# Patient Record
Sex: Female | Born: 1975 | Race: White | Hispanic: No | Marital: Married | Smoking: Former smoker
Health system: Southern US, Community
[De-identification: ages and names within clinical notes are randomized; demographics above are authoritative.]

## PROBLEM LIST (undated history)

## (undated) DIAGNOSIS — F4310 Post-traumatic stress disorder, unspecified: Secondary | ICD-10-CM

## (undated) DIAGNOSIS — M1990 Unspecified osteoarthritis, unspecified site: Secondary | ICD-10-CM

## (undated) DIAGNOSIS — F329 Major depressive disorder, single episode, unspecified: Secondary | ICD-10-CM

## (undated) DIAGNOSIS — Z87442 Personal history of urinary calculi: Secondary | ICD-10-CM

## (undated) DIAGNOSIS — F419 Anxiety disorder, unspecified: Secondary | ICD-10-CM

## (undated) DIAGNOSIS — F32A Depression, unspecified: Secondary | ICD-10-CM

## (undated) DIAGNOSIS — K219 Gastro-esophageal reflux disease without esophagitis: Secondary | ICD-10-CM

## (undated) HISTORY — PX: KNEE ARTHROSCOPY: SUR90

## (undated) HISTORY — DX: Gastro-esophageal reflux disease without esophagitis: K21.9

## (undated) HISTORY — PX: TUBAL LIGATION: SHX77

## (undated) HISTORY — PX: HERNIA REPAIR: SHX51

## (undated) HISTORY — PX: CHOLECYSTECTOMY: SHX55

## (undated) HISTORY — DX: Post-traumatic stress disorder, unspecified: F43.10

---

## 2015-06-27 ENCOUNTER — Encounter (HOSPITAL_COMMUNITY): Payer: Self-pay | Admitting: Emergency Medicine

## 2015-06-27 ENCOUNTER — Emergency Department (HOSPITAL_COMMUNITY)
Admission: EM | Admit: 2015-06-27 | Discharge: 2015-06-27 | Disposition: A | Discharge status: Home / Self Care | Payer: Self-pay | Attending: Emergency Medicine | Admitting: Emergency Medicine

## 2015-06-27 DIAGNOSIS — F17200 Nicotine dependence, unspecified, uncomplicated: Secondary | ICD-10-CM | POA: Insufficient documentation

## 2015-06-27 DIAGNOSIS — Z880 Allergy status to penicillin: Secondary | ICD-10-CM | POA: Insufficient documentation

## 2015-06-27 DIAGNOSIS — K429 Umbilical hernia without obstruction or gangrene: Secondary | ICD-10-CM | POA: Insufficient documentation

## 2015-06-27 LAB — CBC
HEMATOCRIT: 39.1 % (ref 36.0–46.0)
HEMOGLOBIN: 12.7 g/dL (ref 12.0–15.0)
MCH: 28.1 pg (ref 26.0–34.0)
MCHC: 32.5 g/dL (ref 30.0–36.0)
MCV: 86.5 fL (ref 78.0–100.0)
Platelets: 275 10*3/uL (ref 150–400)
RBC: 4.52 MIL/uL (ref 3.87–5.11)
RDW: 13.7 % (ref 11.5–15.5)
WBC: 8.8 10*3/uL (ref 4.0–10.5)

## 2015-06-27 LAB — URINE MICROSCOPIC-ADD ON

## 2015-06-27 LAB — COMPREHENSIVE METABOLIC PANEL
ALBUMIN: 3.7 g/dL (ref 3.5–5.0)
ALK PHOS: 61 U/L (ref 38–126)
ALT: 17 U/L (ref 14–54)
ANION GAP: 8 (ref 5–15)
AST: 23 U/L (ref 15–41)
BUN: 5 mg/dL — AB (ref 6–20)
CALCIUM: 9.2 mg/dL (ref 8.9–10.3)
CO2: 25 mmol/L (ref 22–32)
Chloride: 107 mmol/L (ref 101–111)
Creatinine, Ser: 0.77 mg/dL (ref 0.44–1.00)
GFR calc Af Amer: >60 mL/min (ref >60)
GFR calc non Af Amer: >60 mL/min (ref >60)
GLUCOSE: 101 mg/dL — AB (ref 65–99)
Potassium: 3.9 mmol/L (ref 3.5–5.1)
SODIUM: 140 mmol/L (ref 135–145)
Total Bilirubin: 0.6 mg/dL (ref 0.3–1.2)
Total Protein: 7 g/dL (ref 6.5–8.1)

## 2015-06-27 LAB — URINALYSIS, ROUTINE W REFLEX MICROSCOPIC
BILIRUBIN URINE: NEGATIVE
Glucose, UA: NEGATIVE mg/dL
Ketones, ur: NEGATIVE mg/dL
Leukocytes, UA: NEGATIVE
Nitrite: NEGATIVE
PH: 7.5 (ref 5.0–8.0)
Protein, ur: NEGATIVE mg/dL
SPECIFIC GRAVITY, URINE: 1.011 (ref 1.005–1.030)

## 2015-06-27 LAB — LIPASE, BLOOD: Lipase: 34 U/L (ref 11–51)

## 2015-06-27 MED ORDER — ONDANSETRON HCL 4 MG/2ML IJ SOLN
4.0000 mg | Freq: Once | INTRAMUSCULAR | Status: AC
  Administered 2015-06-27: 4 mg via INTRAVENOUS
  Filled 2015-06-27: qty 2

## 2015-06-27 MED ORDER — HYDROCODONE-ACETAMINOPHEN 5-325 MG PO TABS
2.0000 | ORAL_TABLET | ORAL | Status: DC | PRN
Start: 2015-06-27 — End: 2016-10-16

## 2015-06-27 MED ORDER — HYDROMORPHONE HCL 1 MG/ML IJ SOLN
1.0000 mg | Freq: Once | INTRAMUSCULAR | Status: AC
  Administered 2015-06-27: 1 mg via INTRAVENOUS
  Filled 2015-06-27: qty 1

## 2015-06-27 NOTE — ED Provider Notes (Signed)
CSN: [id number]     Arrival date & time 06/27/15  1034 History   First MD Initiated Contact with Patient 06/27/15 1333     Chief Complaint  Patient presents with  . Abdominal Pain     (Consider location/radiation/quality/duration/timing/severity/associated sxs/prior Treatment) Patient is a 40 y.o. female presenting with abdominal pain. The history is provided by the patient. No language interpreter was used.  Abdominal Pain Pain location:  Periumbilical Pain quality: aching   Pain radiates to:  Back Pain severity:  Moderate Onset quality:  Gradual Duration:  1 day Timing:  Constant Progression:  Worsening Chronicity:  New Relieved by:  Nothing Worsened by:  Nothing tried Ineffective treatments:  None tried Associated symptoms: no diarrhea   Risk factors: multiple surgeries   Pt reports she has had a hernia in the past.  Pt reports today area is hard and painful.   Pt reports it feels larger than in the past  History reviewed. No pertinent past medical history. History reviewed. No pertinent past surgical history. History reviewed. No pertinent family history. Social History  Substance Use Topics  . Smoking status: Current Every Day Smoker  . Smokeless tobacco: None  . Alcohol Use: No   OB History    No data available     Review of Systems  Gastrointestinal: Positive for abdominal pain. Negative for diarrhea.  All other systems reviewed and are negative.     Allergies  Penicillins  Home Medications   Prior to Admission medications   Medication Sig Start Date End Date Taking? Authorizing Provider  ibuprofen (ADVIL,MOTRIN) 200 MG tablet Take 800 mg by mouth 2 (two) times daily as needed for mild pain.   Yes Historical Provider, MD   BP 147/94 mmHg  Pulse 73  Temp(Src) 98.7 F (37.1 C) (Oral)  Resp 18  SpO2 97% Physical Exam  Constitutional: She is oriented to person, place, and time. She appears well-developed and well-nourished.  HENT:  Head:  Normocephalic.  Eyes: EOM are normal. Pupils are equal, round, and reactive to light.  Neck: Normal range of motion.  Cardiovascular: Normal rate and normal heart sounds.   Pulmonary/Chest: Effort normal.  10cm firm area above umbilicus  Abdominal: She exhibits no distension.  Musculoskeletal: Normal range of motion.  Neurological: She is alert and oriented to person, place, and time.  Psychiatric: She has a normal mood and affect.  Nursing note and vitals reviewed.   ED Course  Procedures (including critical care time) Labs Review Labs Reviewed  COMPREHENSIVE METABOLIC PANEL - Abnormal; Notable for the following:    Glucose, Bld 101 (*)    BUN 5 (*)    All other components within normal limits  LIPASE, BLOOD  CBC  URINALYSIS, ROUTINE W REFLEX MICROSCOPIC (NOT AT [facility])    Imaging Review No results found. I have personally reviewed and evaluated these images and lab results as part of my medical decision-making.   EKG Interpretation None      MDM hernia reduced with pain medication and patient lying down.   Pt has a large abdominal wall defect.   Pt advised to schedule to see general surgery for evaluation.     Final diagnoses:  Recurrent umbilical hernia    Meds ordered this encounter  Medications  . HYDROmorphone (DILAUDID) injection 1 mg    Sig:   . ondansetron (ZOFRAN) injection 4 mg    Sig:   . ibuprofen (ADVIL,MOTRIN) 200 MG tablet    Sig: Take 800 mg by mouth  2 (two) times daily as needed for mild pain.  Marland Kitchen HYDROcodone-acetaminophen (NORCO/VICODIN) 5-325 MG tablet    Sig: Take 2 tablets by mouth every 4 (four) hours as needed.    Dispense:  10 tablet    Refill:  0    Order Specific Question:  Supervising Provider    Answer:  Noemi Chapel [[id number]]    Schedule to see [facility] for evaluation  Fransico Meadow, PA-C 06/27/15 [place], PA-C 06/27/15 1500  Charlesetta Shanks, MD 06/29/15 1705

## 2015-06-27 NOTE — ED Notes (Signed)
Pt sts abd pain with bulging area around naval that is hard and painful to palpate

## 2015-06-27 NOTE — Discharge Instructions (Signed)
Hernia, Adult A hernia is the bulging of an organ or tissue through a weak spot in the muscles of the abdomen (abdominal wall). Hernias develop most often near the navel or groin. There are many kinds of hernias. Common kinds include:  Femoral hernia. This kind of hernia develops under the groin in the upper thigh area.  Inguinal hernia. This kind of hernia develops in the groin or scrotum.  Umbilical hernia. This kind of hernia develops near the navel.  Hiatal hernia. This kind of hernia causes part of the stomach to be pushed up into the chest.  Incisional hernia. This kind of hernia bulges through a scar from an abdominal surgery. CAUSES This condition may be caused by:  Heavy lifting.  Coughing over a long period of time.  Straining to have a bowel movement.  An incision made during an abdominal surgery.  A birth defect (congenital defect).  Excess weight or obesity.  Smoking.  Poor nutrition.  Cystic fibrosis.  Excess fluid in the abdomen.  Undescended testicles. SYMPTOMS Symptoms of a hernia include:  A lump on the abdomen. This is the first sign of a hernia. The lump may become more obvious with standing, straining, or coughing. It may get bigger over time if it is not treated or if the condition causing it is not treated.  Pain. A hernia is usually painless, but it may become painful over time if treatment is delayed. The pain is usually dull and may get worse with standing or lifting heavy objects. Sometimes a hernia gets tightly squeezed in the weak spot (strangulated) or stuck there (incarcerated) and causes additional symptoms. These symptoms may include:  Vomiting.  Nausea.  Constipation.  Irritability. DIAGNOSIS A hernia may be diagnosed with:  A physical exam. During the exam your health care provider may ask you to cough or to make a specific movement, because a hernia is usually more visible when you move.  Imaging tests. These can  include:  X-rays.  Ultrasound.  CT scan. TREATMENT A hernia that is small and painless may not need to be treated. A hernia that is large or painful may be treated with surgery. Inguinal hernias may be treated with surgery to prevent incarceration or strangulation. Strangulated hernias are always treated with surgery, because lack of blood to the trapped organ or tissue can cause it to die. Surgery to treat a hernia involves pushing the bulge back into place and repairing the weak part of the abdomen. HOME CARE INSTRUCTIONS  Avoid straining.  Do not lift anything heavier than 10 lb (4.5 kg).  Lift with your leg muscles, not your back muscles. This helps avoid strain.  When coughing, try to cough gently.  Prevent constipation. Constipation leads to straining with bowel movements, which can make a hernia worse or cause a hernia repair to break down. You can prevent constipation by:  Eating a high-fiber diet that includes plenty of fruits and vegetables.  Drinking enough fluids to keep your urine clear or pale yellow. Aim to drink 6-8 glasses of water per day.  Using a stool softener as directed by your health care provider.  Lose weight, if you are overweight.  Do not use any tobacco products, including cigarettes, chewing tobacco, or electronic cigarettes. If you need help quitting, ask your health care provider.  Keep all follow-up visits as directed by your health care provider. This is important. Your health care provider may need to monitor your condition. SEEK MEDICAL CARE IF:  You have   swelling, redness, and pain in the affected area.  Your bowel habits change. SEEK IMMEDIATE MEDICAL CARE IF:  You have a fever.  You have abdominal pain that is getting worse.  You feel nauseous or you vomit.  You cannot push the hernia back in place by gently pressing on it while you are lying down.  The hernia:  Changes in shape or size.  Is stuck outside the  abdomen.  Becomes discolored.  Feels hard or tender.   This information is not intended to replace advice given to you by your health care provider. Make sure you discuss any questions you have with your health care provider.   Document Released: 03/19/2005 Document Revised: 04/09/2014 Document Reviewed: 01/27/2014 Elsevier Interactive Patient Education 2016 [employer]  

## 2016-10-16 ENCOUNTER — Ambulatory Visit (INDEPENDENT_AMBULATORY_CARE_PROVIDER_SITE_OTHER): Payer: 59 | Admitting: Family Medicine

## 2016-10-16 ENCOUNTER — Encounter: Payer: Self-pay | Admitting: Family Medicine

## 2016-10-16 VITALS — BP 124/85 | HR 72 | Temp 98.3°F | Ht 69.0 in | Wt 222.0 lb

## 2016-10-16 DIAGNOSIS — M958 Other specified acquired deformities of musculoskeletal system: Secondary | ICD-10-CM | POA: Diagnosis not present

## 2016-10-16 DIAGNOSIS — Z13220 Encounter for screening for lipoid disorders: Secondary | ICD-10-CM | POA: Diagnosis not present

## 2016-10-16 DIAGNOSIS — M130 Polyarthritis, unspecified: Secondary | ICD-10-CM

## 2016-10-16 DIAGNOSIS — L989 Disorder of the skin and subcutaneous tissue, unspecified: Secondary | ICD-10-CM | POA: Diagnosis not present

## 2016-10-16 MED ORDER — GABAPENTIN 300 MG PO CAPS: 300.0000 mg | ORAL_CAPSULE | Freq: Three times a day (TID) | ORAL | 3 refills | Status: DC

## 2016-10-16 NOTE — Progress Notes (Signed)
HPI  Patient presents today here to establish care with polyarthritis, skin lesions, abdominal wall defect repaired  Patient is fasting.  Patient reports approximately one year history of constant polyarthritis. She has no joint that is any worsening other. No previous injury. No serious morning stiffness, no swollen or red joints.  Abdominal wall defect Reports swelling of the abdominal wall just around the umbilicus, at times it has been very tender, on most days against her pain due to eating or not having regular bowel movements. Patient has been seen several places and told that it was cosmetic.  Skin lesion Right lateral neck, left upper back Left upper back lesion 0 to by her bra strap, at times it scabs over and bleeds, it does seem to be getting darker.   PMH: Smoking status noted ROS: Per HPI  Objective: BP 124/85   Pulse 72   Temp 98.3 F (36.8 C) (Oral)   Ht '5\' 9"'  (1.753 m)   Wt 222 lb (100.7 kg)   LMP 09/29/2016   BMI 32.78 kg/m  Gen: NAD, alert, cooperative with exam HEENT: NCAT, EOMI, PERRL CV: RRR, good S1/S2, no murmur Resp: CTABL, no wheezes, non-labored Abd: SNTND, BS present, no guarding or organomegaly- abdominal wall defect midline similar to diastases recti with a larger area centrally. Ext: No edema, warm Neuro: Alert and oriented, No gross deficits Msk: No characteristic changes of RA on the hands bilaterally, normal-appearing joints of no gross deformity, warmth, or swelling  Skin:  L upper back wih approx 3 cm pedunculated with large foot on L upper back, well circumscribed hyperpigmented lesion R neck with approx 5 mm pedunculated soft flesh colored lesion  Assessment and plan:  # polyarthritis Multiple joints Rule out RA Possible fibro- gabapentin given Discussed cymbalta RTC 6-8 weeks  # Skin lesion Left upper back likely deserves treatment, refer to dermatology Right neck appears to be atypical appearing skin tag.  #  Abdominal wall defect Patient concerned about umbilical hernia and has tenderness multiple times a week. She has had her tubes tied, they entered through that area. She does appear to have diastases recti, however there is a larger area centrally located that appears/feels larger than usual. Investigate with soft tissue ultrasound  Screening for hyperlipidemia Fasting, labs  Need to discuss mammo and pap  Orders Placed This Encounter  Procedures  . US Abdomen Limited    Standing Status:   Future    Standing Expiration Date:   12/17/2017    Order Specific Question:   Reason for Exam (SYMPTOM  OR DIAGNOSIS REQUIRED)    Answer:   umbilical hernia evala, has diastasis but may have superimposed umbilical hernia.    Order Specific Question:   Preferred imaging location?    Answer:   [facility]  . Lipid panel  . CMP14+EGFR  . CBC with Differential/Platelet  . ANA,IFA RA Diag Pnl w/rflx Tit/Patn  . Ambulatory referral to Dermatology    Referral Priority:   Routine    Referral Type:   Consultation    Referral Reason:   Specialty Services Required    Requested Specialty:   Dermatology    Number of Visits Requested:   1    Meds ordered this encounter  Medications  . omeprazole (PRILOSEC) 20 MG capsule    Sig: Take 20 mg by mouth daily.  Marland Kitchen gabapentin (NEURONTIN) 300 MG capsule    Sig: Take 1 capsule (300 mg total) by mouth 3 (three) times daily.  Dispense:  90 capsule    Refill:  [place], MD [facility] 10/16/2016, 2:28 PM

## 2016-10-16 NOTE — Patient Instructions (Addendum)
Great to see you!  Come back in 6-8 weeks to follow up for joint pains

## 2016-10-18 LAB — CBC WITH DIFFERENTIAL/PLATELET
Basophils Absolute: 0.1 10*3/uL (ref 0.0–0.2)
Basos: 1 %
EOS (ABSOLUTE): 0.2 10*3/uL (ref 0.0–0.4)
Eos: 2 %
Hematocrit: 37.5 % (ref 34.0–46.6)
Hemoglobin: 12.8 g/dL (ref 11.1–15.9)
Immature Grans (Abs): 0 10*3/uL (ref 0.0–0.1)
Immature Granulocytes: 0 %
LYMPHS ABS: 2.5 10*3/uL (ref 0.7–3.1)
Lymphs: 27 %
MCH: 28.2 pg (ref 26.6–33.0)
MCHC: 34.1 g/dL (ref 31.5–35.7)
MCV: 83 fL (ref 79–97)
MONOCYTES: 6 %
Monocytes Absolute: 0.6 10*3/uL (ref 0.1–0.9)
NEUTROS ABS: 6 10*3/uL (ref 1.4–7.0)
Neutrophils: 64 %
Platelets: 287 10*3/uL (ref 150–379)
RBC: 4.54 x10E6/uL (ref 3.77–5.28)
RDW: 15.1 % (ref 12.3–15.4)
WBC: 9.3 10*3/uL (ref 3.4–10.8)

## 2016-10-18 LAB — CMP14+EGFR
ALBUMIN: 4.3 g/dL (ref 3.5–5.5)
ALK PHOS: 65 IU/L (ref 39–117)
ALT: 11 IU/L (ref 0–32)
AST: 15 IU/L (ref 0–40)
Albumin/Globulin Ratio: 1.5 (ref 1.2–2.2)
BILIRUBIN TOTAL: 0.3 mg/dL (ref 0.0–1.2)
BUN / CREAT RATIO: 10 (ref 9–23)
BUN: 8 mg/dL (ref 6–24)
CHLORIDE: 102 mmol/L (ref 96–106)
CO2: 21 mmol/L (ref 20–29)
Calcium: 9.4 mg/dL (ref 8.7–10.2)
Creatinine, Ser: 0.78 mg/dL (ref 0.57–1.00)
GFR calc non Af Amer: 94 mL/min/{1.73_m2} (ref >59)
GFR, EST AFRICAN AMERICAN: 108 mL/min/{1.73_m2} (ref >59)
GLUCOSE: 84 mg/dL (ref 65–99)
Globulin, Total: 2.9 g/dL (ref 1.5–4.5)
POTASSIUM: 4.4 mmol/L (ref 3.5–5.2)
SODIUM: 139 mmol/L (ref 134–144)
Total Protein: 7.2 g/dL (ref 6.0–8.5)

## 2016-10-18 LAB — ANA,IFA RA DIAG PNL W/RFLX TIT/PATN
ANA TITER 1: NEGATIVE
Cyclic Citrullin Peptide Ab: 8 units (ref 0–19)

## 2016-10-18 LAB — LIPID PANEL
CHOL/HDL RATIO: 7.9 ratio — AB (ref 0.0–4.4)
CHOLESTEROL TOTAL: 267 mg/dL — AB (ref 100–199)
HDL: 34 mg/dL — AB (ref >39)
LDL CALC: 208 mg/dL — AB (ref 0–99)
Triglycerides: 127 mg/dL (ref 0–149)
VLDL CHOLESTEROL CAL: 25 mg/dL (ref 5–40)

## 2016-10-22 ENCOUNTER — Ambulatory Visit (HOSPITAL_COMMUNITY)
Admission: RE | Admit: 2016-10-22 | Discharge: 2016-10-22 | Disposition: A | Discharge status: Home / Self Care | Payer: 59 | Source: Ambulatory Visit | Attending: Family Medicine | Admitting: Family Medicine

## 2016-10-22 ENCOUNTER — Telehealth: Payer: Self-pay | Admitting: Family Medicine

## 2016-10-22 DIAGNOSIS — K429 Umbilical hernia without obstruction or gangrene: Secondary | ICD-10-CM | POA: Insufficient documentation

## 2016-10-22 DIAGNOSIS — M958 Other specified acquired deformities of musculoskeletal system: Secondary | ICD-10-CM | POA: Diagnosis present

## 2016-10-22 NOTE — Telephone Encounter (Signed)
Pt awrae 

## 2016-10-22 NOTE — Telephone Encounter (Signed)
Will refer to general surgery for umbilcal hernia.   Laroy Apple, MD [facility] 10/22/2016, 11:58 AM

## 2016-11-16 ENCOUNTER — Ambulatory Visit: Payer: Self-pay | Admitting: Surgery

## 2016-11-16 ENCOUNTER — Other Ambulatory Visit: Payer: Self-pay | Admitting: Surgery

## 2016-11-16 ENCOUNTER — Ambulatory Visit
Admission: RE | Admit: 2016-11-16 | Discharge: 2016-11-16 | Disposition: A | Discharge status: Home / Self Care | Payer: 59 | Source: Ambulatory Visit | Attending: Surgery | Admitting: Surgery

## 2016-11-16 DIAGNOSIS — K429 Umbilical hernia without obstruction or gangrene: Secondary | ICD-10-CM

## 2016-11-16 IMAGING — CT CT ABD-PELV W/ CM
3 of 5 series · 13 of 36 positions shown, 19 images · IV contrast (agent unspecified)
Comparison: None.

CLINICAL DATA: Umbilical hernia for 5 years. Painful and increasing
in size.

EXAM:
CT ABDOMEN AND PELVIS WITH CONTRAST
TECHNIQUE: Multidetector CT imaging of the abdomen and pelvis was performed
using the standard protocol following bolus administration of
intravenous contrast.
CONTRAST:  125 mL [EJ]

[Series 3: abd/pelvis with · axial · 0.93mm/px · z∈[-310,+10]mm · 6 of 90 slices shown, 11 images]
[im 13/90  soft-tissue]
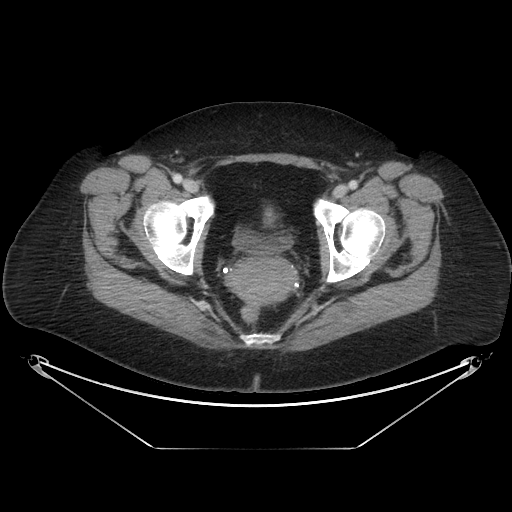
[im 13/90  bone]
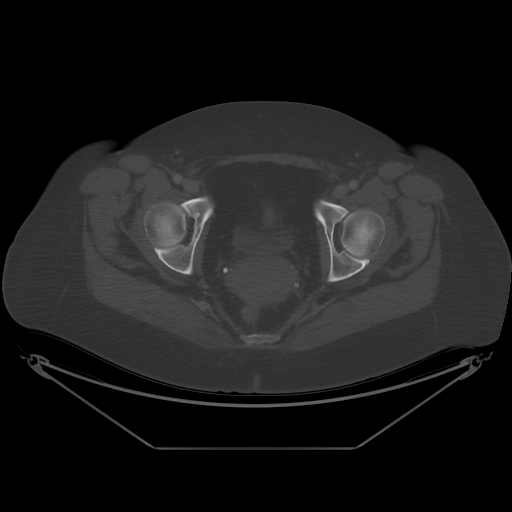
[im 26/90  soft-tissue]
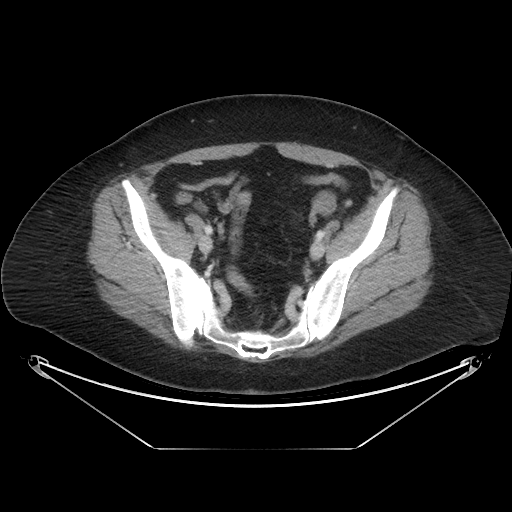
[im 39/90  soft-tissue]
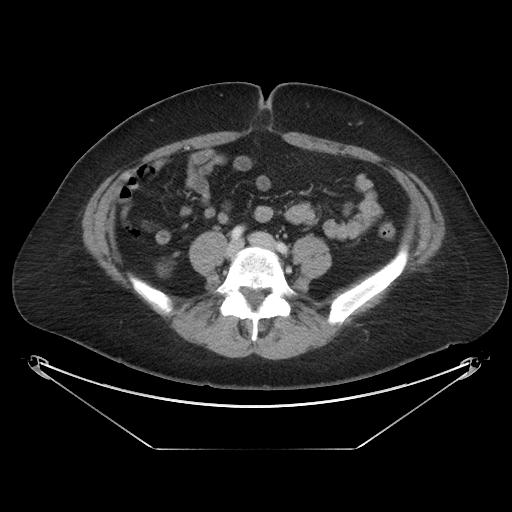
[im 39/90  lung]
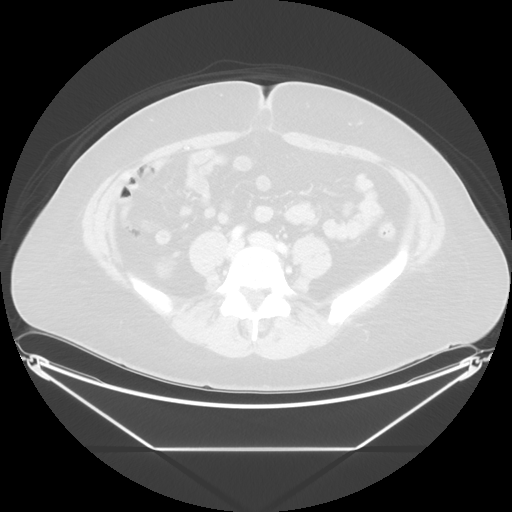
[im 51/90  soft-tissue]
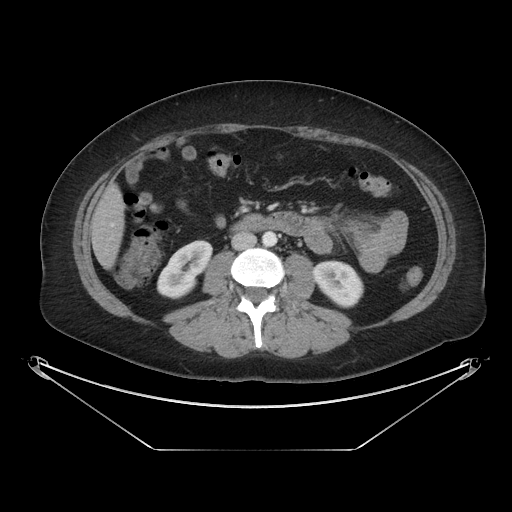
[im 51/90  lung]
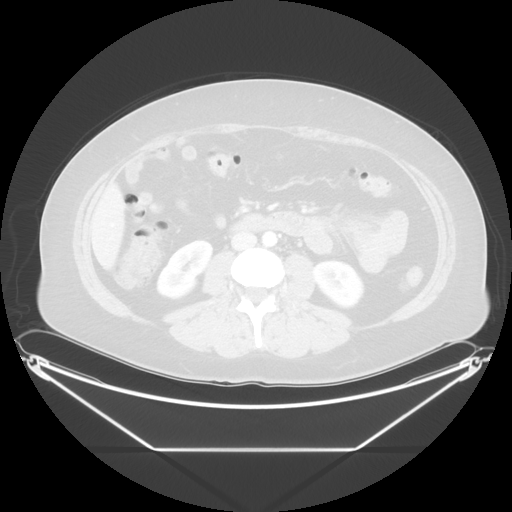
[im 64/90  soft-tissue]
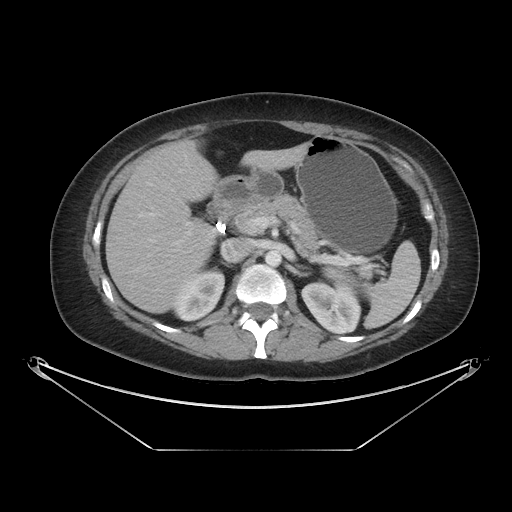
[im 64/90  lung]
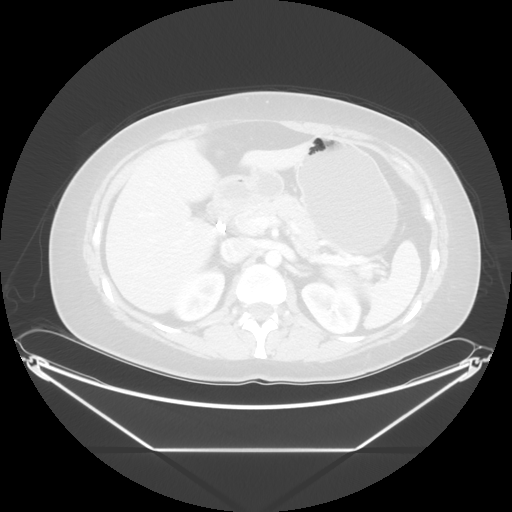
[im 77/90  soft-tissue]
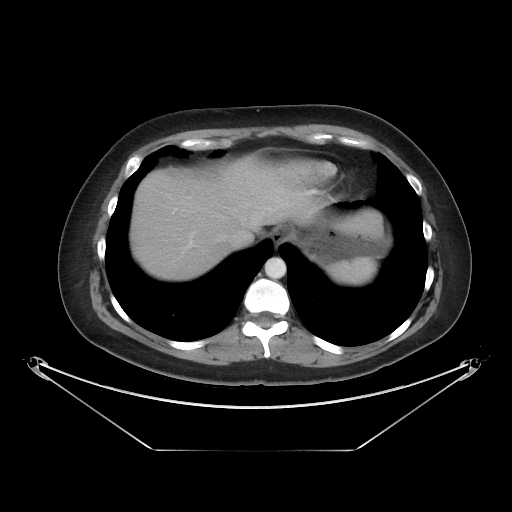
[im 77/90  lung]
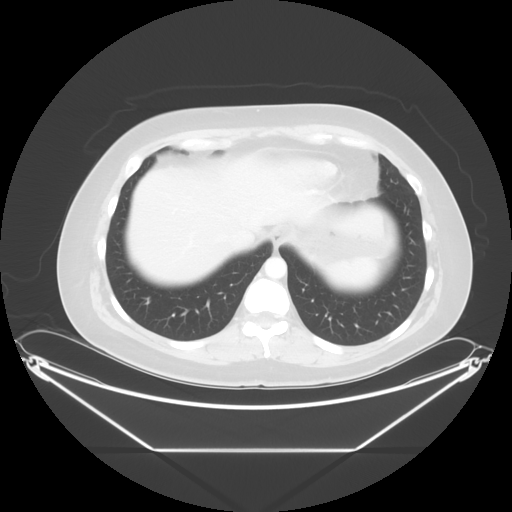

[Series 601: coronal body · coronal · 0.93mm/px · 1 of 146 slices shown, 2 images]
[im 49/146  soft-tissue]
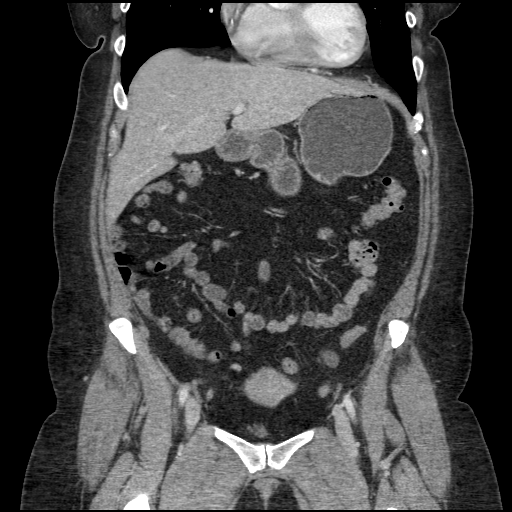
[im 49/146  bone]
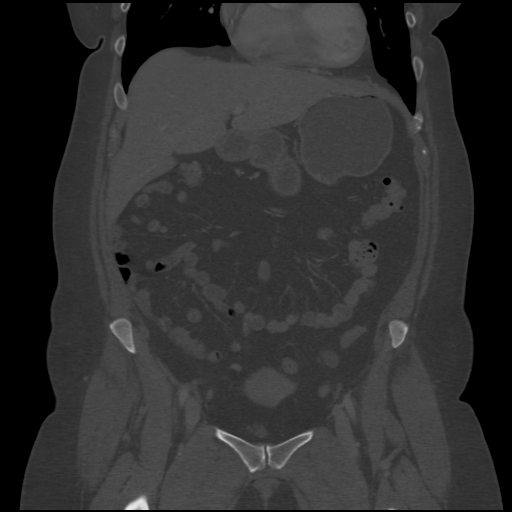

[Series 602: sagittal body · sagittal · 0.93mm/px · 6 of 191 slices shown]
[im 23/191  soft-tissue]
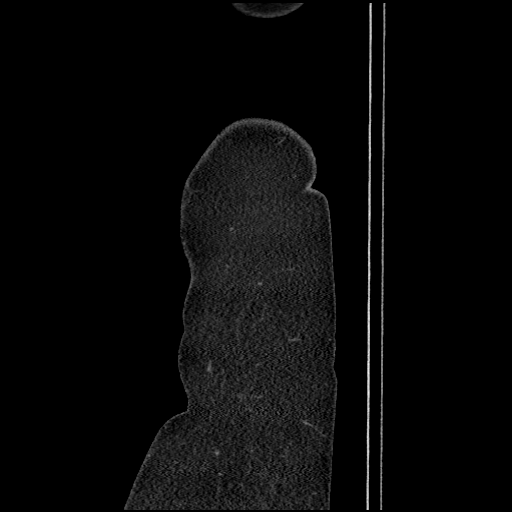
[im 45/191  soft-tissue]
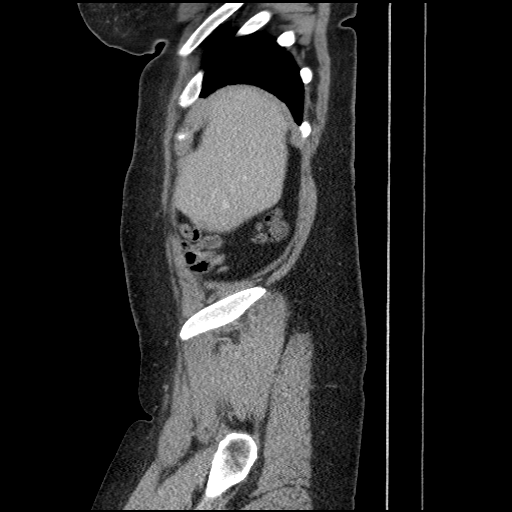
[im 68/191  soft-tissue]
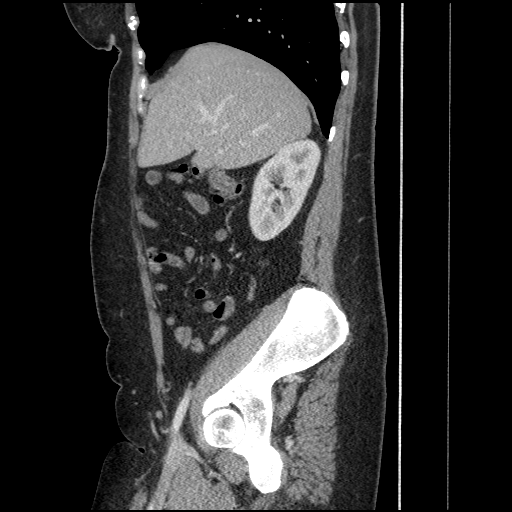
[im 90/191  soft-tissue]
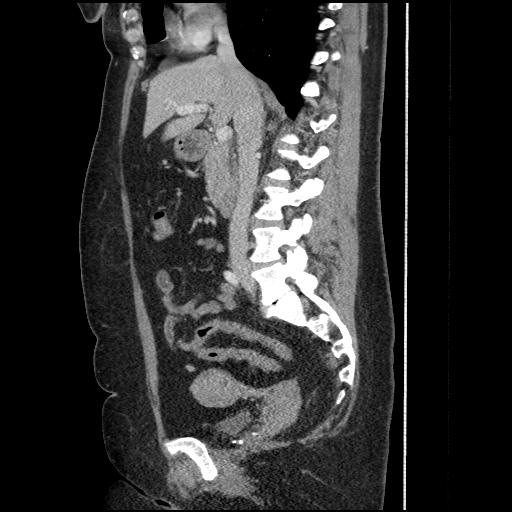
[im 112/191  soft-tissue]
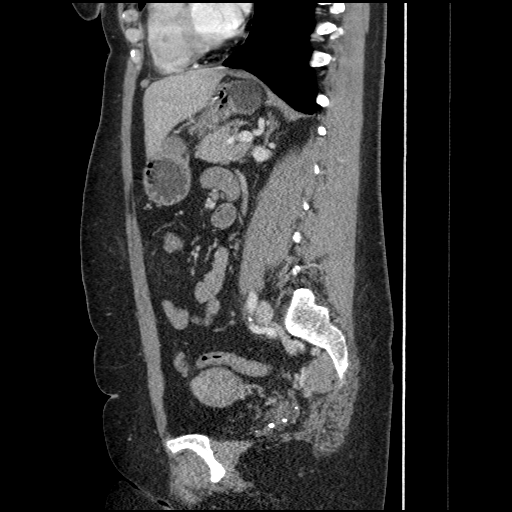
[im 135/191  soft-tissue]
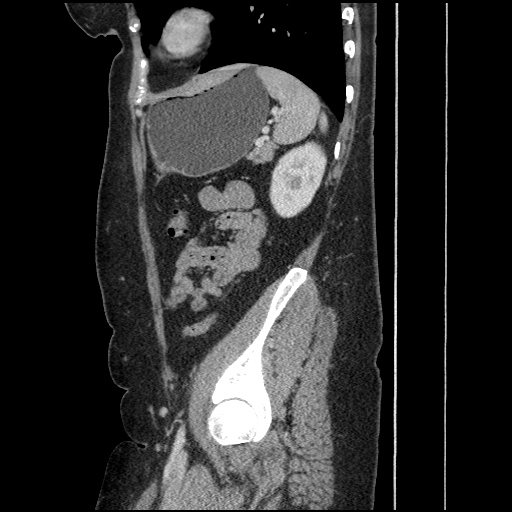

[13 of 36 positions shown; findings below may reference images not displayed]

FINDINGS: Lower chest: No acute abnormality.

Hepatobiliary: No focal liver abnormality is seen. Status post
cholecystectomy. No biliary dilatation.

Pancreas: Unremarkable. No pancreatic ductal dilatation or
surrounding inflammatory changes.

Spleen: Normal in size without focal abnormality.

Adrenals/Urinary Tract: Adrenal glands are unremarkable. Kidneys are
normal, without renal calculi, focal lesion, or hydronephrosis.
Bladder is unremarkable.

Stomach/Bowel: Stomach is within normal limits. Appendix appears
normal. No evidence of bowel wall thickening, distention, or
inflammatory changes.

Vascular/Lymphatic: No significant vascular findings are present. No
enlarged abdominal or pelvic lymph nodes.

Reproductive: Uterus and bilateral adnexa are unremarkable.

Other: Wide-mouth supraumbilical fat containing midline hernia
measuring 4.4 x 4 cm. Small fat containing umbilical hernia. No
abdominopelvic ascites.

Musculoskeletal: No acute osseous abnormality. No lytic or sclerotic
osseous lesion. Mild bilateral sacroiliitis, right worse than left.
IMPRESSION: 1. No acute abdominal or pelvic pathology.
2. Wide-mouth supraumbilical fat containing midline hernia measuring
4.4 x 4 cm.
3. Small fat containing umbilical hernia.

## 2016-11-16 MED ORDER — IOPAMIDOL (ISOVUE-300) INJECTION 61%: 125.0000 mL | Freq: Once | INTRAVENOUS | Status: DC | PRN

## 2016-11-16 MED ORDER — IOPAMIDOL (ISOVUE-300) INJECTION 61%: 100.0000 mL | Freq: Once | INTRAVENOUS | Status: DC | PRN

## 2016-11-16 NOTE — H&P (Signed)
Emily Boone 11/16/2016 1:51 PM Location: [facility] Patient #: [id number] DOB: 03/27/1976 Married / Language: English / Race: White Female   History of Present Illness (Dory Demont A. Wynelle Dreier MD; 11/16/2016 2:19 PM) Patient words: This is a very nice and relatively healthy 41-year-old woman is referred for supra-umbilical hernia in the setting of a known diastases recti. She has had this since about 2013 and feels that it has gotten bigger. It is increasingly uncomfortable for her now causing pain whenever it is touched, she also notes increased size of the hernia after she is an occasional issues with constipation. She has had a laparoscopic converted to open cholecystectomy and a laparoscopic tubal ligation. 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 She smokes rare cigarettes.  She is interested in having the hernia repaired and possibly the diastases.  The patient is a 41 year old female.   Past Surgical History (Tanisha A. Brown, RMA; 11/16/2016 1:52 PM) Gallbladder Surgery - Open  Knee Surgery  Right.  Diagnostic Studies History (Tanisha A. Brown, RMA; 11/16/2016 1:52 PM) Colonoscopy  5-10 years ago Mammogram  >3 years ago Pap Smear  1-5 years ago  Allergies (Tanisha A. Brown, RMA; 11/16/2016 1:54 PM) No Known Drug Allergies 11/16/2016 Allergies Reconciled   Medication History (Tanisha A. Brown, RMA; 11/16/2016 1:54 PM) Ibuprofen (200MG Capsule, Oral) Active. Omeprazole (20MG Capsule DR, Oral) Active. Gabapentin (300MG Capsule, Oral) Active. Medications Reconciled  Social History (Tanisha A. Brown, RMA; 11/16/2016 1:52 PM) Alcohol use  Occasional alcohol use. Caffeine use  Coffee. No drug use  Tobacco use  Former smoker.  Family History (Tanisha A. Brown, RMA; 11/16/2016 1:52 PM) Alcohol Abuse  Father. Arthritis  Family Members In General. Breast Cancer  Family Members In General. Colon Polyps  Family Members  In General. Depression  Mother, Sister. Heart Disease  Family Members In General. Hypertension  Family Members In General. Ovarian Cancer  Family Members In General.  Pregnancy / Birth History (Tanisha A. Brown, RMA; 11/16/2016 1:52 PM) Age at menarche  9 years. Gravida  5 Irregular periods  Length (months) of breastfeeding  3-6 Maternal age  21-25 Para  4  Other Problems (Tanisha A. Brown, RMA; 11/16/2016 1:52 PM) Anxiety Disorder  Back Pain  Cholelithiasis  Gastric Ulcer  Gastroesophageal Reflux Disease  Hemorrhoids  Hypercholesterolemia  Kidney Stone  Umbilical Hernia Repair     Review of Systems (Tanisha A. Brown RMA; 11/16/2016 1:52 PM) General Present- Fatigue and Night Sweats. Not Present- Appetite Loss, Chills, Fever, Weight Gain and Weight Loss. Skin Not Present- Change in Wart/Mole, Dryness, Hives, Jaundice, New Lesions, Non-Healing Wounds, Rash and Ulcer. HEENT Present- Seasonal Allergies and Wears glasses/contact lenses. Not Present- Earache, Hearing Loss, Hoarseness, Nose Bleed, Oral Ulcers, Ringing in the Ears, Sinus Pain, Sore Throat, Visual Disturbances and Yellow Eyes. Respiratory Not Present- Bloody sputum, Chronic Cough, Difficulty Breathing, Snoring and Wheezing. Breast Not Present- Breast Mass, Breast Pain, Nipple Discharge and Skin Changes. Cardiovascular Present- Leg Cramps. Not Present- Chest Pain, Difficulty Breathing Lying Down, Palpitations, Rapid Heart Rate, Shortness of Breath and Swelling of Extremities. Gastrointestinal Present- Abdominal Pain, Change in Bowel Habits, Gets full quickly at meals and Indigestion. Not Present- Bloating, Bloody Stool, Chronic diarrhea, Constipation, Difficulty Swallowing, Excessive gas, Hemorrhoids, Nausea, Rectal Pain and Vomiting. Female Genitourinary Present- Frequency. Not Present- Nocturia, Painful Urination, Pelvic Pain and Urgency. Musculoskeletal Present- Back Pain, Joint Pain, Joint Stiffness  and Muscle Pain. Not Present- Muscle Weakness and Swelling   of Extremities. Neurological Not Present- Decreased Memory, Fainting, Headaches, Numbness, Seizures, Tingling, Tremor, Trouble walking and Weakness. Psychiatric Not Present- Anxiety, Bipolar, Change in Sleep Pattern, Depression, Fearful and Frequent crying. Endocrine Present- Hot flashes. Not Present- Cold Intolerance, Excessive Hunger, Hair Changes, Heat Intolerance and New Diabetes. Hematology Not Present- Blood Thinners, Easy Bruising, Excessive bleeding, Gland problems, HIV and Persistent Infections.  Vitals (Tanisha A. Brown RMA; 11/16/2016 1:53 PM) 11/16/2016 1:53 PM Weight: 219.8 lb Height: 69in Body Surface Area: 2.15 m Body Mass Index: 32.46 kg/m  Temp.: 97.1F  Pulse: 79 (Regular)  BP: 128/82 (Sitting, Left Arm, Standard)       Physical Exam (Aalina Brege A. Farhad Burleson MD; 11/16/2016 2:22 PM) General Note: She is alert and well-appearing   Integumentary Note: Skin is warm and dry   Head and Neck Note: No mass or thyromegaly   Chest and Lung Exam Note: Unlabored respirations. Symmetrical air entry.   Cardiovascular Note: Regular rate and rhythm. No pedal edema   Abdomen Note: Soft, nondistended. When the patient is standing there is a notable bulge superior to the umbilicus. With the patient supine this reduces partially but remains tender. I'm not able to feel the fascial edges. She does have a diastases superior to this. She did have an ultrasound that showed a fat-containing supraumbilical hernia with a 4.8 cm neck.   Neurologic Note: Grossly intact. Normal gait.   Neuropsychiatric Note: Normal mood and affect. Appropriate insight.   Musculoskeletal Note: Strength symmetrical throughout. No deformity.     Assessment & Plan (Kirti Carl A. Burak Zerbe MD; 11/16/2016 2:24 PM) HERNIA, UMBILICAL (K42.9) Story: Hernia confirmed by ultrasound in the setting of the midline diastases. I  recommended a CT scan to further delineate the borders of the hernia and the width of the diastases is not really able to palpate it distinctly because of her habitus. I discussed with her the options of repair including open versus laparoscopic versus combined laparoscopic and open approach and we discussed the merits of each. I discussed with her that if the diastases is somewhat wide and may be beneficial to have plastic surgeon involved to plicate this. We discussed the use of mesh, the risks of surgery including bleeding, pain, scarring, intra-abdominal injury, ileus, fistula, mesh infection, and recurrence of hernia. She states understanding all her questions were answered. We will get a CT, follow-up pending results.  Addendum: CT shows a 4.5x4.3cm epigastric hernia and a small umbilical hernia. I discussed with her open repair with mesh.   Signed electronically by Tersea Aulds A Bobie Kistler, MD (11/16/2016 2:24 PM) 

## 2016-11-20 ENCOUNTER — Other Ambulatory Visit: Payer: Self-pay | Admitting: Surgery

## 2016-11-27 ENCOUNTER — Ambulatory Visit: Payer: 59 | Admitting: Family Medicine

## 2016-12-04 NOTE — Patient Instructions (Addendum)
Emily Boone  12/04/2016   Your procedure is scheduled on: 12/14/2016    Report to [facility] Main  Entrance Take [place]  elevators to 3rd floor to  [place] at    [place] AM.     Call this number if you have problems the morning of surgery [phone]    Remember: ONLY 1 PERSON MAY GO WITH YOU TO SHORT STAY TO GET  READY MORNING OF [name]  Do not eat food or drink liquids :After Midnight.     Take these medicines the morning of surgery with A SIP OF WATER: , Prilosec                                 You may not have any metal on your body including hair pins and              piercings  Do not wear jewelry, make-up, lotions, powders or perfumes, deodorant             Do not wear nail polish.  Do not shave  48 hours prior to surgery.     Do not bring valuables to the hospital. [place].  Contacts, dentures or bridgework may not be worn into surgery.  Leave suitcase in the car. After surgery it may be brought to your room.                     Please read over the following fact sheets you were given: _____________________________________________________________________             [facility] - Preparing for Surgery Before surgery, you can play an important role.  Because skin is not sterile, your skin needs to be as free of germs as possible.  You can reduce the number of germs on your skin by washing with CHG (chlorahexidine gluconate) soap before surgery.  CHG is an antiseptic cleaner which kills germs and bonds with the skin to continue killing germs even after washing. Please DO NOT use if you have an allergy to CHG or antibacterial soaps.  If your skin becomes reddened/irritated stop using the CHG and inform your nurse when you arrive at Short Stay. Do not shave (including legs and underarms) for at least 48 hours prior to the first CHG shower.  You may shave your face/neck. Please  follow these instructions carefully:  1.  Shower with CHG Soap the night before surgery and the  morning of Surgery.  2.  If you choose to wash your hair, wash your hair first as usual with your  normal  shampoo.  3.  After you shampoo, rinse your hair and body thoroughly to remove the  shampoo.                           4.  Use CHG as you would any other liquid soap.  You can apply chg directly  to the skin and wash                       Gently with a scrungie or clean washcloth.  5.  Apply the CHG Soap to your body ONLY  FROM THE NECK DOWN.   Do not use on face/ open                           Wound or open sores. Avoid contact with eyes, ears mouth and genitals (private parts).                       Wash face,  Genitals (private parts) with your normal soap.             6.  Wash thoroughly, paying special attention to the area where your surgery  will be performed.  7.  Thoroughly rinse your body with warm water from the neck down.  8.  DO NOT shower/wash with your normal soap after using and rinsing off  the CHG Soap.                9.  Pat yourself dry with a clean towel.            10.  Wear clean pajamas.            11.  Place clean sheets on your bed the night of your first shower and do not  sleep with pets. Day of Surgery : Do not apply any lotions/deodorants the morning of surgery.  Please wear clean clothes to the hospital/surgery center.  FAILURE TO FOLLOW THESE INSTRUCTIONS MAY RESULT IN THE CANCELLATION OF YOUR SURGERY PATIENT SIGNATURE_________________________________  NURSE SIGNATURE__________________________________  ________________________________________________________________________

## 2016-12-06 ENCOUNTER — Ambulatory Visit: Payer: 59 | Admitting: Family Medicine

## 2016-12-06 ENCOUNTER — Encounter (HOSPITAL_COMMUNITY): Payer: Self-pay

## 2016-12-06 ENCOUNTER — Encounter (HOSPITAL_COMMUNITY)
Admission: RE | Admit: 2016-12-06 | Discharge: 2016-12-06 | Disposition: A | Discharge status: Home / Self Care | Payer: 59 | Source: Ambulatory Visit | Attending: Surgery | Admitting: Surgery

## 2016-12-06 ENCOUNTER — Other Ambulatory Visit: Payer: Self-pay

## 2016-12-06 DIAGNOSIS — K469 Unspecified abdominal hernia without obstruction or gangrene: Secondary | ICD-10-CM | POA: Diagnosis not present

## 2016-12-06 DIAGNOSIS — Z01810 Encounter for preprocedural cardiovascular examination: Secondary | ICD-10-CM | POA: Insufficient documentation

## 2016-12-06 DIAGNOSIS — Z01812 Encounter for preprocedural laboratory examination: Secondary | ICD-10-CM | POA: Insufficient documentation

## 2016-12-06 HISTORY — DX: Depression, unspecified: F32.A

## 2016-12-06 HISTORY — DX: Anxiety disorder, unspecified: F41.9

## 2016-12-06 HISTORY — DX: Personal history of urinary calculi: Z87.442

## 2016-12-06 HISTORY — DX: Major depressive disorder, single episode, unspecified: F32.9

## 2016-12-06 LAB — CBC WITH DIFFERENTIAL/PLATELET
Basophils Absolute: 0 10*3/uL (ref 0.0–0.1)
Basophils Relative: 0 %
Eosinophils Absolute: 0.2 10*3/uL (ref 0.0–0.7)
Eosinophils Relative: 3 %
HEMATOCRIT: 39.1 % (ref 36.0–46.0)
HEMOGLOBIN: 12.9 g/dL (ref 12.0–15.0)
LYMPHS ABS: 2.1 10*3/uL (ref 0.7–4.0)
LYMPHS PCT: 27 %
MCH: 28.9 pg (ref 26.0–34.0)
MCHC: 33 g/dL (ref 30.0–36.0)
MCV: 87.5 fL (ref 78.0–100.0)
Monocytes Absolute: 0.5 10*3/uL (ref 0.1–1.0)
Monocytes Relative: 6 %
NEUTROS PCT: 64 %
Neutro Abs: 4.7 10*3/uL (ref 1.7–7.7)
Platelets: 254 10*3/uL (ref 150–400)
RBC: 4.47 MIL/uL (ref 3.87–5.11)
RDW: 14.2 % (ref 11.5–15.5)
WBC: 7.5 10*3/uL (ref 4.0–10.5)

## 2016-12-06 LAB — APTT: aPTT: 27 seconds (ref 24–36)

## 2016-12-06 LAB — COMPREHENSIVE METABOLIC PANEL
ALK PHOS: 57 U/L (ref 38–126)
ALT: 12 U/L — AB (ref 14–54)
AST: 18 U/L (ref 15–41)
Albumin: 3.8 g/dL (ref 3.5–5.0)
Anion gap: 7 (ref 5–15)
BILIRUBIN TOTAL: 0.4 mg/dL (ref 0.3–1.2)
BUN: 13 mg/dL (ref 6–20)
CALCIUM: 9.3 mg/dL (ref 8.9–10.3)
CO2: 26 mmol/L (ref 22–32)
CREATININE: 0.84 mg/dL (ref 0.44–1.00)
Chloride: 102 mmol/L (ref 101–111)
Glucose, Bld: 85 mg/dL (ref 65–99)
Potassium: 4.3 mmol/L (ref 3.5–5.1)
Sodium: 135 mmol/L (ref 135–145)
Total Protein: 7.2 g/dL (ref 6.5–8.1)

## 2016-12-06 LAB — PROTIME-INR
INR: 1.02
PROTHROMBIN TIME: 13.3 s (ref 11.4–15.2)

## 2016-12-07 NOTE — Progress Notes (Signed)
Final EKG done 12/06/16 in epic

## 2016-12-13 NOTE — Anesthesia Preprocedure Evaluation (Addendum)
Anesthesia Evaluation  Patient identified by MRN, date of birth, ID band Patient awake    Reviewed: Allergy & Precautions, NPO status , Patient's Chart, lab work & pertinent test results  Airway Mallampati: I  TM Distance: >3 FB Neck ROM: Full    Dental no notable dental hx. (+) Teeth Intact, Chipped,    Pulmonary neg pulmonary ROS, former smoker,    Pulmonary exam normal breath sounds clear to auscultation       Cardiovascular negative cardio ROS Normal cardiovascular exam Rhythm:Regular Rate:Normal     Neuro/Psych PSYCHIATRIC DISORDERS Depression negative neurological ROS  negative psych ROS   GI/Hepatic negative GI ROS, Neg liver ROS, GERD  Medicated,  Endo/Other  negative endocrine ROS  Renal/GU negative Renal ROS  negative genitourinary   Musculoskeletal negative musculoskeletal ROS (+)   Abdominal   Peds negative pediatric ROS (+)  Hematology negative hematology ROS (+)   Anesthesia Other Findings   Reproductive/Obstetrics negative OB ROS                            Anesthesia Physical Anesthesia Plan  ASA: II  Anesthesia Plan: General   Post-op Pain Management:    Induction: Intravenous  PONV Risk Score and Plan: 3 and Ondansetron, Dexamethasone, Midazolam, Treatment may vary due to age or medical condition and Scopolamine patch - Pre-op  Airway Management Planned: Oral ETT  Additional Equipment:   Intra-op Plan:   Post-operative Plan: Extubation in OR  Informed Consent:   Plan Discussed with:   Anesthesia Plan Comments: (  )        Anesthesia Quick Evaluation

## 2016-12-14 ENCOUNTER — Ambulatory Visit (HOSPITAL_COMMUNITY): Payer: 59 | Admitting: Anesthesiology

## 2016-12-14 ENCOUNTER — Observation Stay (HOSPITAL_COMMUNITY)
Admission: RE | Admit: 2016-12-14 | Discharge: 2016-12-15 | Disposition: A | Discharge status: Home / Self Care | Payer: 59 | Source: Ambulatory Visit | Attending: Surgery | Admitting: Surgery

## 2016-12-14 ENCOUNTER — Encounter (HOSPITAL_COMMUNITY)
Admission: RE | Disposition: A | Discharge status: Home / Self Care | Payer: Self-pay | Source: Ambulatory Visit | Attending: Surgery

## 2016-12-14 ENCOUNTER — Encounter (HOSPITAL_COMMUNITY): Payer: Self-pay | Admitting: *Deleted

## 2016-12-14 DIAGNOSIS — K436 Other and unspecified ventral hernia with obstruction, without gangrene: Secondary | ICD-10-CM | POA: Insufficient documentation

## 2016-12-14 DIAGNOSIS — E7800 Pure hypercholesterolemia, unspecified: Secondary | ICD-10-CM | POA: Insufficient documentation

## 2016-12-14 DIAGNOSIS — Z87891 Personal history of nicotine dependence: Secondary | ICD-10-CM | POA: Insufficient documentation

## 2016-12-14 DIAGNOSIS — K420 Umbilical hernia with obstruction, without gangrene: Principal | ICD-10-CM | POA: Insufficient documentation

## 2016-12-14 DIAGNOSIS — K219 Gastro-esophageal reflux disease without esophagitis: Secondary | ICD-10-CM | POA: Diagnosis not present

## 2016-12-14 DIAGNOSIS — F329 Major depressive disorder, single episode, unspecified: Secondary | ICD-10-CM | POA: Diagnosis not present

## 2016-12-14 DIAGNOSIS — Z79899 Other long term (current) drug therapy: Secondary | ICD-10-CM | POA: Insufficient documentation

## 2016-12-14 DIAGNOSIS — K429 Umbilical hernia without obstruction or gangrene: Secondary | ICD-10-CM | POA: Diagnosis present

## 2016-12-14 DIAGNOSIS — K439 Ventral hernia without obstruction or gangrene: Secondary | ICD-10-CM | POA: Diagnosis present

## 2016-12-14 HISTORY — PX: INSERTION OF MESH: SHX5868

## 2016-12-14 HISTORY — PX: UMBILICAL HERNIA REPAIR: SHX196

## 2016-12-14 HISTORY — PX: EPIGASTRIC HERNIA REPAIR: SHX404

## 2016-12-14 LAB — PREGNANCY, URINE: Preg Test, Ur: NEGATIVE

## 2016-12-14 SURGERY — REPAIR, HERNIA, EPIGASTRIC, ADULT
Anesthesia: General | Site: Abdomen

## 2016-12-14 MED ORDER — VANCOMYCIN HCL IN DEXTROSE 1-5 GM/200ML-% IV SOLN
1000.0000 mg | INTRAVENOUS | Status: AC
  Administered 2016-12-14: 1000 mg via INTRAVENOUS

## 2016-12-14 MED ORDER — SUGAMMADEX SODIUM 200 MG/2ML IV SOLN
INTRAVENOUS | Status: DC | PRN
  Administered 2016-12-14: 200 mg via INTRAVENOUS

## 2016-12-14 MED ORDER — DEXAMETHASONE SODIUM PHOSPHATE 10 MG/ML IJ SOLN
INTRAMUSCULAR | Status: AC
  Filled 2016-12-14: qty 1

## 2016-12-14 MED ORDER — MEPERIDINE HCL 50 MG/ML IJ SOLN: 6.2500 mg | INTRAMUSCULAR | Status: DC | PRN

## 2016-12-14 MED ORDER — HYDRALAZINE HCL 20 MG/ML IJ SOLN: 10.0000 mg | INTRAMUSCULAR | Status: DC | PRN

## 2016-12-14 MED ORDER — DEXAMETHASONE SODIUM PHOSPHATE 10 MG/ML IJ SOLN
INTRAMUSCULAR | Status: DC | PRN
  Administered 2016-12-14: 10 mg via INTRAVENOUS

## 2016-12-14 MED ORDER — ROCURONIUM BROMIDE 10 MG/ML (PF) SYRINGE
PREFILLED_SYRINGE | INTRAVENOUS | Status: DC | PRN
  Administered 2016-12-14: 50 mg via INTRAVENOUS
  Administered 2016-12-14: 10 mg via INTRAVENOUS

## 2016-12-14 MED ORDER — POLYETHYLENE GLYCOL 3350 17 G PO PACK: 17.0000 g | PACK | Freq: Every day | ORAL | Status: DC | PRN

## 2016-12-14 MED ORDER — GABAPENTIN 300 MG PO CAPS
300.0000 mg | ORAL_CAPSULE | ORAL | Status: AC
  Administered 2016-12-14: 300 mg via ORAL
  Filled 2016-12-14: qty 1

## 2016-12-14 MED ORDER — ROCURONIUM BROMIDE 50 MG/5ML IV SOSY
PREFILLED_SYRINGE | INTRAVENOUS | Status: AC
  Filled 2016-12-14: qty 5

## 2016-12-14 MED ORDER — FENTANYL CITRATE (PF) 100 MCG/2ML IJ SOLN
INTRAMUSCULAR | Status: AC
  Filled 2016-12-14: qty 2

## 2016-12-14 MED ORDER — LIDOCAINE 2% (20 MG/ML) 5 ML SYRINGE
INTRAMUSCULAR | Status: AC
  Filled 2016-12-14: qty 5

## 2016-12-14 MED ORDER — ACETAMINOPHEN 325 MG PO TABS: 650.0000 mg | ORAL_TABLET | Freq: Four times a day (QID) | ORAL | Status: DC | PRN

## 2016-12-14 MED ORDER — DIPHENHYDRAMINE HCL 25 MG PO CAPS: 25.0000 mg | ORAL_CAPSULE | Freq: Four times a day (QID) | ORAL | Status: DC | PRN

## 2016-12-14 MED ORDER — PROPOFOL 10 MG/ML IV BOLUS
INTRAVENOUS | Status: AC
  Filled 2016-12-14: qty 40

## 2016-12-14 MED ORDER — FENTANYL CITRATE (PF) 250 MCG/5ML IJ SOLN
INTRAMUSCULAR | Status: DC | PRN
  Administered 2016-12-14: 150 ug via INTRAVENOUS
  Administered 2016-12-14 (×2): 50 ug via INTRAVENOUS

## 2016-12-14 MED ORDER — ONDANSETRON HCL 4 MG/2ML IJ SOLN: 4.0000 mg | Freq: Four times a day (QID) | INTRAMUSCULAR | Status: DC | PRN

## 2016-12-14 MED ORDER — DIPHENHYDRAMINE HCL 50 MG/ML IJ SOLN: 25.0000 mg | Freq: Four times a day (QID) | INTRAMUSCULAR | Status: DC | PRN

## 2016-12-14 MED ORDER — SODIUM CHLORIDE 0.9 % IV SOLN
INTRAVENOUS | Status: DC
  Administered 2016-12-14: 16:00:00 via INTRAVENOUS

## 2016-12-14 MED ORDER — HYDROMORPHONE HCL-NACL 0.5-0.9 MG/ML-% IV SOSY
PREFILLED_SYRINGE | INTRAVENOUS | Status: AC
  Filled 2016-12-14: qty 1

## 2016-12-14 MED ORDER — ONDANSETRON HCL 4 MG/2ML IJ SOLN: 4.0000 mg | Freq: Once | INTRAMUSCULAR | Status: DC | PRN

## 2016-12-14 MED ORDER — LACTATED RINGERS IV SOLN
INTRAVENOUS | Status: DC | PRN
  Administered 2016-12-14 (×2): via INTRAVENOUS

## 2016-12-14 MED ORDER — OXYCODONE HCL 5 MG PO TABS
5.0000 mg | ORAL_TABLET | ORAL | Status: DC | PRN
  Administered 2016-12-14: 5 mg via ORAL
  Filled 2016-12-14: qty 1

## 2016-12-14 MED ORDER — GABAPENTIN 300 MG PO CAPS
300.0000 mg | ORAL_CAPSULE | Freq: Three times a day (TID) | ORAL | Status: DC
Start: 2016-12-14 — End: 2016-12-15
  Administered 2016-12-14 – 2016-12-15 (×4): 300 mg via ORAL
  Filled 2016-12-14 (×4): qty 1

## 2016-12-14 MED ORDER — ACETAMINOPHEN 500 MG PO TABS
1000.0000 mg | ORAL_TABLET | ORAL | Status: AC
  Administered 2016-12-14: 1000 mg via ORAL
  Filled 2016-12-14: qty 2

## 2016-12-14 MED ORDER — ONDANSETRON HCL 4 MG/2ML IJ SOLN
INTRAMUSCULAR | Status: AC
  Filled 2016-12-14: qty 2

## 2016-12-14 MED ORDER — METHOCARBAMOL 500 MG PO TABS: 500.0000 mg | ORAL_TABLET | Freq: Four times a day (QID) | ORAL | 0 refills | Status: DC | PRN

## 2016-12-14 MED ORDER — MIDAZOLAM HCL 5 MG/5ML IJ SOLN
INTRAMUSCULAR | Status: DC | PRN
  Administered 2016-12-14: 2 mg via INTRAVENOUS

## 2016-12-14 MED ORDER — VANCOMYCIN HCL IN DEXTROSE 1-5 GM/200ML-% IV SOLN
INTRAVENOUS | Status: AC
  Filled 2016-12-14: qty 200

## 2016-12-14 MED ORDER — OXYCODONE-ACETAMINOPHEN 5-325 MG PO TABS: 1.0000 | ORAL_TABLET | Freq: Four times a day (QID) | ORAL | 0 refills | Status: DC | PRN

## 2016-12-14 MED ORDER — FENTANYL CITRATE (PF) 250 MCG/5ML IJ SOLN
INTRAMUSCULAR | Status: AC
  Filled 2016-12-14: qty 5

## 2016-12-14 MED ORDER — ENOXAPARIN SODIUM 40 MG/0.4ML ~~LOC~~ SOLN
40.0000 mg | SUBCUTANEOUS | Status: DC
  Administered 2016-12-14: 40 mg via SUBCUTANEOUS
  Filled 2016-12-14: qty 0.4

## 2016-12-14 MED ORDER — METOPROLOL TARTRATE 5 MG/5ML IV SOLN: 5.0000 mg | Freq: Four times a day (QID) | INTRAVENOUS | Status: DC | PRN

## 2016-12-14 MED ORDER — HYDROMORPHONE HCL-NACL 0.5-0.9 MG/ML-% IV SOSY
0.5000 mg | PREFILLED_SYRINGE | INTRAVENOUS | Status: DC | PRN
  Administered 2016-12-14: 0.5 mg via INTRAVENOUS

## 2016-12-14 MED ORDER — PANTOPRAZOLE SODIUM 40 MG PO TBEC
40.0000 mg | DELAYED_RELEASE_TABLET | Freq: Every day | ORAL | Status: DC
  Administered 2016-12-14: 40 mg via ORAL
  Filled 2016-12-14: qty 1

## 2016-12-14 MED ORDER — CHLORHEXIDINE GLUCONATE 4 % EX LIQD: 60.0000 mL | Freq: Once | CUTANEOUS | Status: DC

## 2016-12-14 MED ORDER — BUPIVACAINE-EPINEPHRINE (PF) 0.25% -1:200000 IJ SOLN
INTRAMUSCULAR | Status: AC
  Filled 2016-12-14: qty 30

## 2016-12-14 MED ORDER — SODIUM CHLORIDE 0.9 % IR SOLN
Status: DC | PRN
  Administered 2016-12-14: 1000 mL

## 2016-12-14 MED ORDER — MIDAZOLAM HCL 2 MG/2ML IJ SOLN
INTRAMUSCULAR | Status: AC
  Filled 2016-12-14: qty 2

## 2016-12-14 MED ORDER — ONDANSETRON HCL 4 MG/2ML IJ SOLN
INTRAMUSCULAR | Status: DC | PRN
  Administered 2016-12-14: 4 mg via INTRAVENOUS

## 2016-12-14 MED ORDER — FENTANYL CITRATE (PF) 100 MCG/2ML IJ SOLN
25.0000 ug | INTRAMUSCULAR | Status: DC | PRN
  Administered 2016-12-14 (×3): 50 ug via INTRAVENOUS

## 2016-12-14 MED ORDER — DOCUSATE SODIUM 100 MG PO CAPS: 100.0000 mg | ORAL_CAPSULE | Freq: Two times a day (BID) | ORAL | 0 refills | Status: AC

## 2016-12-14 MED ORDER — CELECOXIB 200 MG PO CAPS
400.0000 mg | ORAL_CAPSULE | ORAL | Status: AC
  Administered 2016-12-14: 400 mg via ORAL
  Filled 2016-12-14: qty 2

## 2016-12-14 MED ORDER — LIDOCAINE 2% (20 MG/ML) 5 ML SYRINGE
INTRAMUSCULAR | Status: DC | PRN
  Administered 2016-12-14: 100 mg via INTRAVENOUS

## 2016-12-14 MED ORDER — BUPIVACAINE-EPINEPHRINE 0.25% -1:200000 IJ SOLN
INTRAMUSCULAR | Status: DC | PRN
  Administered 2016-12-14: 30 mL

## 2016-12-14 MED ORDER — ONDANSETRON 4 MG PO TBDP: 4.0000 mg | ORAL_TABLET | Freq: Four times a day (QID) | ORAL | Status: DC | PRN

## 2016-12-14 MED ORDER — METHOCARBAMOL 500 MG PO TABS: 500.0000 mg | ORAL_TABLET | Freq: Four times a day (QID) | ORAL | Status: DC | PRN

## 2016-12-14 MED ORDER — BISACODYL 10 MG RE SUPP: 10.0000 mg | Freq: Every day | RECTAL | Status: DC | PRN

## 2016-12-14 MED ORDER — SUGAMMADEX SODIUM 200 MG/2ML IV SOLN
INTRAVENOUS | Status: AC
  Filled 2016-12-14: qty 2

## 2016-12-14 MED ORDER — ACETAMINOPHEN 650 MG RE SUPP: 650.0000 mg | Freq: Four times a day (QID) | RECTAL | Status: DC | PRN

## 2016-12-14 MED ORDER — IBUPROFEN 800 MG PO TABS
800.0000 mg | ORAL_TABLET | Freq: Three times a day (TID) | ORAL | Status: DC | PRN
  Administered 2016-12-15: 800 mg via ORAL
  Filled 2016-12-14: qty 1

## 2016-12-14 MED ORDER — PROPOFOL 10 MG/ML IV BOLUS
INTRAVENOUS | Status: DC | PRN
  Administered 2016-12-14: 200 mg via INTRAVENOUS

## 2016-12-14 MED ORDER — DOCUSATE SODIUM 100 MG PO CAPS
100.0000 mg | ORAL_CAPSULE | Freq: Two times a day (BID) | ORAL | Status: DC
  Administered 2016-12-14 – 2016-12-15 (×2): 100 mg via ORAL
  Filled 2016-12-14 (×2): qty 1

## 2016-12-14 SURGICAL SUPPLY — 35 items
BENZOIN TINCTURE PRP APPL 2/3 (GAUZE/BANDAGES/DRESSINGS) IMPLANT
BINDER ABD UNIV 12 45-62 (WOUND CARE) ×1 IMPLANT
BINDER ABDOMINAL 12 ML 46-62 (SOFTGOODS) ×3 IMPLANT
BINDER ABDOMINAL 46IN 62IN (WOUND CARE) ×3
BLADE 11 SAFETY STRL DISP (BLADE) ×3 IMPLANT
CHLORAPREP W/TINT 26ML (MISCELLANEOUS) ×3 IMPLANT
CLOSURE WOUND 1/2 X4 (GAUZE/BANDAGES/DRESSINGS) ×1
COVER SURGICAL LIGHT HANDLE (MISCELLANEOUS) ×3 IMPLANT
DECANTER SPIKE VIAL GLASS SM (MISCELLANEOUS) ×3 IMPLANT
DEVICE PMI PUNCTURE CLOSURE (MISCELLANEOUS) ×3 IMPLANT
DRAPE LAPAROSCOPIC ABDOMINAL (DRAPES) ×3 IMPLANT
DRSG OPSITE POSTOP 4X6 (GAUZE/BANDAGES/DRESSINGS) ×3 IMPLANT
ELECT REM PT RETURN 15FT ADLT (MISCELLANEOUS) ×3 IMPLANT
GAUZE SPONGE 4X4 12PLY STRL (GAUZE/BANDAGES/DRESSINGS) IMPLANT
GLOVE BIO SURGEON STRL SZ 6 (GLOVE) ×3 IMPLANT
GLOVE INDICATOR 6.5 STRL GRN (GLOVE) ×3 IMPLANT
GOWN STRL REUS W/TWL LRG LVL3 (GOWN DISPOSABLE) ×6 IMPLANT
GOWN STRL REUS W/TWL XL LVL3 (GOWN DISPOSABLE) ×3 IMPLANT
KIT BASIN OR (CUSTOM PROCEDURE TRAY) ×3 IMPLANT
MARKER SKIN DUAL TIP RULER LAB (MISCELLANEOUS) ×3 IMPLANT
MESH ULTRAPRO 3X6 7.6X15CM (Mesh General) ×3 IMPLANT
NEEDLE HYPO 22GX1.5 SAFETY (NEEDLE) IMPLANT
PACK GENERAL/GYN (CUSTOM PROCEDURE TRAY) ×3 IMPLANT
STRIP CLOSURE SKIN 1/2X4 (GAUZE/BANDAGES/DRESSINGS) ×2 IMPLANT
SUT ETHIBOND 0 MO6 C/R (SUTURE) IMPLANT
SUT MNCRL AB 4-0 PS2 18 (SUTURE) ×3 IMPLANT
SUT PDS AB 1 CT1 27 (SUTURE) ×6 IMPLANT
SUT PROLENE 2 0 CT2 30 (SUTURE) ×6 IMPLANT
SUT VIC AB 0 CT1 27 (SUTURE) ×4
SUT VIC AB 0 CT1 27XBRD ANTBC (SUTURE) ×2 IMPLANT
SUT VIC AB 3-0 SH 27 (SUTURE) ×2
SUT VIC AB 3-0 SH 27XBRD (SUTURE) ×1 IMPLANT
SYR CONTROL 10ML LL (SYRINGE) IMPLANT
TOWEL OR 17X26 10 PK STRL BLUE (TOWEL DISPOSABLE) ×3 IMPLANT
TOWEL OR NON WOVEN STRL DISP B (DISPOSABLE) ×3 IMPLANT

## 2016-12-14 NOTE — Anesthesia Procedure Notes (Signed)
Procedure Name: Intubation Date/Time: 12/14/2016 7:20 AM Performed by: Talbot Grumbling Pre-anesthesia Checklist: Patient identified, Emergency Drugs available, Suction available and Patient being monitored Patient Re-evaluated:Patient Re-evaluated prior to induction Oxygen Delivery Method: Circle system utilized Preoxygenation: Pre-oxygenation with 100% oxygen Induction Type: IV induction Ventilation: Mask ventilation without difficulty Laryngoscope Size: Miller and 2 Grade View: Grade I Tube type: Oral Tube size: 7.5 mm Number of attempts: 1 Airway Equipment and Method: Stylet Placement Confirmation: ETT inserted through vocal cords under direct vision,  positive ETCO2 and breath sounds checked- equal and bilateral Secured at: 21 cm Tube secured with: Tape Dental Injury: Teeth and Oropharynx as per pre-operative assessment

## 2016-12-14 NOTE — Anesthesia Postprocedure Evaluation (Signed)
Anesthesia Post Note  Patient: Musician  Procedure(s) Performed: Procedure(s) (LRB): OPEN REPAIR OF EPIGASTRIC HERNIA  (N/A) OPEN REPAIR OF UMBILICAL HERNIA (N/A) INSERTION OF MESH (N/A)     Patient location during evaluation: PACU Anesthesia Type: General Level of consciousness: awake and alert Pain management: pain level controlled Vital Signs Assessment: post-procedure vital signs reviewed and stable Respiratory status: spontaneous breathing, nonlabored ventilation, respiratory function stable and patient connected to nasal cannula oxygen Cardiovascular status: blood pressure returned to baseline and stable Postop Assessment: no apparent nausea or vomiting Anesthetic complications: no    Last Vitals:  Vitals:   12/14/16 0900 12/14/16 0915  BP: 128/72 115/62  Pulse: 83 (!) 51  Resp: 12 15  Temp:    SpO2: 100% 100%    Last Pain:  Vitals:   12/14/16 0915  TempSrc:   PainSc: 5                  Veeda Virgo

## 2016-12-14 NOTE — Transfer of Care (Signed)
Immediate Anesthesia Transfer of Care Note  Patient: Emily Boone  Procedure(s) Performed: Procedure(s): OPEN REPAIR OF EPIGASTRIC HERNIA  (N/A) OPEN REPAIR OF UMBILICAL HERNIA (N/A) INSERTION OF MESH (N/A)  Patient Location: PACU  Anesthesia Type:General  Level of Consciousness:  sedated, patient cooperative and responds to stimulation  Airway & Oxygen Therapy:Patient Spontanous Breathing and Patient connected to face mask oxgen  Post-op Assessment:  Report given to PACU RN and Post -op Vital signs reviewed and stable  Post vital signs:  Reviewed and stable  Last Vitals:  Vitals:   12/14/16 0537  BP: 124/85  Pulse: 72  Resp: 20  Temp: 36.8 C  SpO2: 62%    Complications: No apparent anesthesia complications

## 2016-12-14 NOTE — H&P (View-Only) (Signed)
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 She has had this since about 2013 and feels that it has gotten bigger. It is increasingly uncomfortable for her now causing pain whenever it is touched, she also notes increased size of the hernia after she is an occasional issues with constipation. She has had a laparoscopic converted to open cholecystectomy and a laparoscopic tubal ligation. 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 She smokes rare cigarettes.  She is interested in having the hernia repaired and possibly the diastases.  The patient is a 41 year old female.   Past Surgical History (Emily Boone, [place]; 11/16/2016 1:52 PM) Gallbladder Surgery - Open  Knee Surgery  Right.  Diagnostic Studies History (Emily Boone, [place]; 11/16/2016 1:52 PM) Colonoscopy  5-10 years ago Mammogram  >3 years ago Pap Smear  1-5 years ago  Allergies (Emily Boone, [name]; 11/16/2016 1:54 PM) No Known Drug Allergies 11/16/2016 Allergies Reconciled   Medication History (Emily Boone, RMA; 11/16/2016 1:54 PM) Ibuprofen (200MG  Capsule, Oral) Active. Omeprazole (20MG  Capsule DR, Oral) Active. Gabapentin (300MG  Capsule, Oral) Active. Medications Reconciled  Social History (Emily Boone, [name]; 11/16/2016 1:52 PM) Alcohol use  Occasional alcohol use. Caffeine use  Coffee. No drug use  Tobacco use  Former smoker.  Family History (Emily Boone, [name]; 11/16/2016 1:52 PM) Alcohol Abuse  Father. Arthritis  Family Members In General. Breast Cancer  Family Members In General. Colon Polyps  Family Members  In General. Depression  Mother, Sister. Heart Disease  Family Members In General. Hypertension  Family Members In General. Ovarian Cancer  Family Members In General.  Pregnancy / Birth History (Emily Boone, [name]; 11/16/2016 1:52 PM) Age at menarche  49 years. Gravida  5 Irregular periods  Length (months) of breastfeeding  3-6 Maternal age  78-25 Para  80  Other Problems (Emily Boone, [name]; 11/16/2016 1:52 PM) Anxiety Disorder  Back Pain  Cholelithiasis  Gastric Ulcer  Gastroesophageal Reflux Disease  Hemorrhoids  Hypercholesterolemia  Kidney Stone  Umbilical Hernia Repair     Review of Systems (Emily A. Emily Boone RMA; 11/16/2016 1:52 PM) General Present- Fatigue and Night Sweats. Not Present- Appetite Loss, Chills, Fever, Weight Gain and Weight Loss. Skin Not Present- Change in Wart/Mole, Dryness, Hives, Jaundice, New Lesions, Non-Healing Wounds, Rash and Ulcer. HEENT Present- Seasonal Allergies and Wears glasses/contact lenses. Not Present- Earache, Hearing Loss, Hoarseness, Nose Bleed, Oral Ulcers, Ringing in the Ears, Sinus Pain, Sore Throat, Visual Disturbances and Yellow Eyes. Respiratory Not Present- Bloody sputum, Chronic Cough, Difficulty Breathing, Snoring and Wheezing. Breast Not Present- Breast Mass, Breast Pain, Nipple Discharge and Skin Changes. Cardiovascular Present- Leg Cramps. Not Present- Chest Pain, Difficulty Breathing Lying Down, Palpitations, Rapid Heart Rate, Shortness of Breath and Swelling of Extremities. Gastrointestinal Present- Abdominal Pain, Change in Bowel Habits, Gets full quickly at meals and Indigestion. Not Present- Bloating, Bloody Stool, Chronic diarrhea, Constipation, Difficulty Swallowing, Excessive gas, Hemorrhoids, Nausea, Rectal Pain and Vomiting. Female Genitourinary Present- Frequency. Not Present- Nocturia, Painful Urination, Pelvic Pain and Urgency. Musculoskeletal Present- Back Pain, Joint Pain, Joint Stiffness  and Muscle Pain. Not Present- Muscle Weakness and Swelling  of Extremities. Neurological Not Present- Decreased Memory, Fainting, Headaches, Numbness, Seizures, Tingling, Tremor, Trouble walking and Weakness. Psychiatric Not Present- Anxiety, Bipolar, Change in Sleep Pattern, Depression, Fearful and Frequent crying. Endocrine Present- Hot flashes. Not Present- Cold Intolerance, Excessive Hunger, Hair Changes, Heat Intolerance and New Diabetes. Hematology Not Present- Blood Thinners, Easy Bruising, Excessive bleeding, Gland problems, HIV and Persistent Infections.  Vitals (Emily A. Emily Boone RMA; 11/16/2016 1:53 PM) 11/16/2016 1:53 PM Weight: 219.8 lb Height: 69in Body Surface Area: 2.15 m Body Mass Index: 32.46 kg/m  Temp.: 97.3F  Pulse: 79 (Regular)  BP: 128/82 (Sitting, Left Arm, Standard)       Physical Exam (Emily Boone; 11/16/2016 2:22 PM) General Note: She is alert and well-appearing   Integumentary Note: Skin is warm and dry   Head and Neck Note: No mass or thyromegaly   Chest and Lung Exam Note: Unlabored respirations. Symmetrical air entry.   Cardiovascular Note: Regular rate and rhythm. No pedal edema   Abdomen Note: Soft, nondistended. When the patient is standing there is a notable bulge superior to the umbilicus. With the patient supine this reduces partially but remains tender. I'm not able to feel the fascial edges. She does have a diastases superior to this. She did have an ultrasound that showed a fat-containing supraumbilical hernia with a 4.8 cm neck.   Neurologic Note: Grossly intact. Normal gait.   Neuropsychiatric Note: Normal mood and affect. Appropriate insight.   Musculoskeletal Note: Strength symmetrical throughout. No deformity.     Assessment & Plan (Emily Boone; 11/16/2016 2:24 PM) HERNIA, UMBILICAL (W38.9) Story: Hernia confirmed by ultrasound in the setting of the midline diastases. I  recommended a CT scan to further delineate the borders of the hernia and the width of the diastases is not really able to palpate it distinctly because of her habitus. I discussed with her the options of repair including open versus laparoscopic versus combined laparoscopic and open approach and we discussed the merits of each. I discussed with her that if the diastases is somewhat wide and may be beneficial to have plastic surgeon involved to plicate this. We discussed the use of mesh, the risks of surgery including bleeding, pain, scarring, intra-abdominal injury, ileus, fistula, mesh infection, and recurrence of hernia. She states understanding all her questions were answered. We will get a CT, follow-up pending results.  Addendum: CT shows a 4.5x4.3cm epigastric hernia and a small umbilical hernia. I discussed with her open repair with mesh.   Signed electronically by Clovis Riley, Boone (11/16/2016 2:24 PM)

## 2016-12-14 NOTE — Interval H&P Note (Signed)
History and Physical Interval Note:  12/14/2016 6:59 AM  Emily Boone  has presented today for surgery, with the diagnosis of hernia  The various methods of treatment have been discussed with the patient and family. After consideration of risks, benefits and other options for treatment, the patient has consented to  Procedure(s): OPEN REPAIR OF EPIGASTRIC HERNIA  (N/A) OPEN REPAIR OF UMBILICAL HERNIA (N/A) INSERTION OF MESH (N/A) as a surgical intervention .  The patient's history has been reviewed, patient examined, no change in status, stable for surgery.  I have reviewed the patient's chart and labs.  Questions were answered to the patient's satisfaction.     Takeisha Cianci Rich Brave

## 2016-12-14 NOTE — Op Note (Signed)
Operative Note  Emily Boone  [id number]  [id number]  12/14/2016   Surgeon: Victorino Sparrow ConnorMD  Assistant: Ralene Ok MD  Procedure performed: Repair of incarcerated ventral and umbilical hernia with mesh  Preop diagnosis: chronically incarcerated epigastric hernia 4.4I3.4VQ, umbilical hernia 1cm Post-op diagnosis/intraop findings: same  Specimens: none Retained items: none EBL: 25ZD Complications: none  Description of procedure: After obtaining informed consent the patient was taken to the operating room and placed supine on operating room table wheregeneral endotracheal anesthesia was initiated, preoperative antibiotics were administered, SCDs applied, and a formal timeout was performed. The patient's abdomen was prepped and draped in usual sterile fashion. A midline vertical incision was created sharply and the soft tissues dissected using cautery until the hernia sac of the supraumbilical hernia was encountered. This is circumferentially isolated and the fascia surrounding it was cleared off. The umbilical skin was dissected away from the umbilical hernia sac and the fascia around the umbilical hernia defect was cleared off and identified as well. There is a very narrow bridge of fascia between the 2 defects and this was divided. Both the hernia sacs and incarcerated preperitoneal fat were excised using cautery. There is also some chronically incarcerated omentum within the epigastric hernia defect and this was reduced easily. The abdominal wall was free of adhesions circumferentially around the hernia defect. I then entered the retrorectus plane, using cautery and blunt dissection to separate the posterior fascia from the overlying rectus muscle circumferentially. The posterior fascia and peritoneum were then closed with a running 0 Vicryl. There was not significant tension on the closure.Hemostasis was ensured within the wound. The ombined hernia defect was 5 cm in maximal  diameter and therefore a 15 cm x 7.5 cm piece of ultra Pro mesh was brought onto the field. This was placed in the retrorectus space and secured at 4 corners using trans-fascial sutures of 2-0 Prolene via a stab incisions using a PMI device. The mesh was additionally tacked down to the posterior fascia at the 9 and 3 positions using 0 Vicryl. It was noted to sit flush within the field with minimal tension. Once again hemostasis was ensured, the field was irrigated and then the anterior fascia was closed with running #1 PDS. The umbilicus was tacked back down to the fascia using a 3-0 Vicryl. The skin incision was then closed with a running subcuticular Monocryl followed by benzoin, Steri-Strips. Benzoin and Steri-Strips were also used to close the stab incisions at the 4 corners of the mesh. A honeycomb dressing was then applied as well as an abdominal binder. The patient was then awakened, extubated and taken to PACU in stable condition.   All counts were correct at the completion of the case.

## 2016-12-14 NOTE — Discharge Instructions (Signed)
HERNIA REPAIR: POST OP INSTRUCTIONS ° °###################################################################### ° °EAT °Gradually transition to a high fiber diet with a fiber supplement over the next few weeks after discharge.  Start with a pureed / full liquid diet (see below) ° °WALK °Walk an hour a day.  Control your pain to do that.   ° °CONTROL PAIN °Control pain so that you can walk, sleep, tolerate sneezing/coughing, go up/down stairs. ° °HAVE A BOWEL MOVEMENT DAILY °Keep your bowels regular to avoid problems.  OK to try a laxative to override constipation.  OK to use an antidairrheal to slow down diarrhea.  Call if not better after 2 tries ° °CALL IF YOU HAVE PROBLEMS/CONCERNS °Call if you are still struggling despite following these instructions. °Call if you have concerns not answered by these instructions ° °###################################################################### ° ° ° °1. DIET: Follow a light bland diet the first 24 hours after arrival home, such as soup, liquids, crackers, etc.  Be sure to include lots of fluids daily.  Avoid fast food or heavy meals as your are more likely to get nauseated.  Eat a low fat the next few days after surgery. °2. Take your usually prescribed home medications unless otherwise directed. °3. PAIN CONTROL: °a. Pain is best controlled by a usual combination of three different methods TOGETHER: °i. Ice/Heat °ii. Over the counter pain medication °iii. Prescription pain medication °b. Most patients will experience some swelling and bruising around the hernia(s) such as the bellybutton, groins, or old incisions.  Ice packs or heating pads (30-60 minutes up to 6 times a day) will help. Use ice for the first few days to help decrease swelling and bruising, then switch to heat to help relax tight/sore spots and speed recovery.  Some people prefer to use ice alone, heat alone, alternating between ice & heat.  Experiment to what works for you.  Swelling and bruising can take  several weeks to resolve.   °c. It is helpful to take an over-the-counter pain medication regularly for the first few weeks.  Choose one of the following that works best for you: °i. Naproxen (Aleve, etc)  Two 220mg tabs twice a day °ii. Ibuprofen (Advil, etc) Three 200mg tabs four times a day (every meal & bedtime) °iii. Acetaminophen (Tylenol, etc) 325-650mg four times a day (every meal & bedtime) °d. A  prescription for pain medication should be given to you upon discharge.  Take your pain medication as prescribed.  °i. If you are having problems/concerns with the prescription medicine (does not control pain, nausea, vomiting, rash, itching, etc), please call us [phone] to see if we need to switch you to a different pain medicine that will work better for you and/or control your side effect better. °ii. If you need a refill on your pain medication, please contact your pharmacy.  They will contact our office to request authorization. Prescriptions will not be filled after 5 pm or on week-ends. °4. Avoid getting constipated.  Between the surgery and the pain medications, it is common to experience some constipation.  Increasing fluid intake and taking a fiber supplement (such as Metamucil, Citrucel, FiberCon, MiraLax, etc) 1-2 times a day regularly will usually help prevent this problem from occurring.  A mild laxative (prune juice, Milk of Magnesia, MiraLax, etc) should be taken according to package directions if there are no bowel movements after 48 hours.   °5. Wash / shower every day.  You may shower over the dressings as they are waterproof.   °6. Remove   your waterproof bandages 5 days after surgery.  You may leave the incision open to air.  You may replace a dressing/Band-Aid to cover the incision for comfort if you wish.  Continue to shower over incision(s) after the dressing is off.    7. ACTIVITIES as tolerated:   a. You may resume regular (light) daily activities beginning the next day--such  as daily self-care, walking, climbing stairs--gradually increasing activities as tolerated.  If you can walk 30 minutes without difficulty, it is safe to try more intense activity such as jogging, treadmill, bicycling, low-impact aerobics, swimming, etc. b. Save the most intensive and strenuous activity for last such as sit-ups, heavy lifting, contact sports, etc  Refrain from any heavy lifting or straining until you are off narcotics for pain control and at least 1 month out from surgery.   c. DO NOT PUSH THROUGH PAIN.  Let pain be your guide: If it hurts to do something, don't do it.  Pain is your body warning you to avoid that activity for another week until the pain goes down. d. You may drive when you are no longer taking prescription pain medication, you can comfortably wear a seatbelt, and you can safely maneuver your car and apply brakes. e. Dennis Bast may have sexual intercourse when it is comfortable.  8. FOLLOW UP in our office a. Please call [facility] at [phone] to set up an appointment to see your surgeon in the office for a follow-up appointment approximately 2-3 weeks after your surgery. b. Make sure that you call for this appointment the day you arrive home to insure a convenient appointment time. 9.  IF YOU HAVE DISABILITY OR FAMILY LEAVE FORMS, BRING THEM TO THE OFFICE FOR PROCESSING.  DO NOT GIVE THEM TO YOUR DOCTOR.  WHEN TO CALL us [phone]: 1. Poor pain control 2. Reactions / problems with new medications (rash/itching, nausea, etc)  3. Fever over 101.5 F (38.5 C) 4. Inability to urinate 5. Nausea and/or vomiting 6. Worsening swelling or bruising 7. Continued bleeding from incision. 8. Increased pain, redness, or drainage from the incision   The clinic staff is available to answer your questions during regular business hours (8:30am-5pm).  Please dont hesitate to call and ask to speak to one of our nurses for clinical concerns.   If you have a medical emergency, go to the  nearest emergency room or call 911.  A surgeon from [facility] is always on call at the hospitals in [facility], [place], [place], [place], [place]  [address] ?  [address] [address], [place], [place]   [address] MAIN: [phone] ? TOLL FREE: [phone] ? FAX: [phone] [email or web address]

## 2016-12-15 DIAGNOSIS — K420 Umbilical hernia with obstruction, without gangrene: Secondary | ICD-10-CM | POA: Diagnosis not present

## 2016-12-15 LAB — BASIC METABOLIC PANEL
ANION GAP: 6 (ref 5–15)
BUN: 7 mg/dL (ref 6–20)
CO2: 26 mmol/L (ref 22–32)
Calcium: 8.5 mg/dL — ABNORMAL LOW (ref 8.9–10.3)
Chloride: 108 mmol/L (ref 101–111)
Creatinine, Ser: 0.69 mg/dL (ref 0.44–1.00)
GFR calc Af Amer: >60 mL/min (ref >60)
GLUCOSE: 98 mg/dL (ref 65–99)
POTASSIUM: 3.8 mmol/L (ref 3.5–5.1)
SODIUM: 140 mmol/L (ref 135–145)

## 2016-12-15 LAB — CBC
HCT: 33.1 % — ABNORMAL LOW (ref 36.0–46.0)
Hemoglobin: 10.9 g/dL — ABNORMAL LOW (ref 12.0–15.0)
MCH: 29 pg (ref 26.0–34.0)
MCHC: 32.9 g/dL (ref 30.0–36.0)
MCV: 88 fL (ref 78.0–100.0)
PLATELETS: 240 10*3/uL (ref 150–400)
RBC: 3.76 MIL/uL — AB (ref 3.87–5.11)
RDW: 14.3 % (ref 11.5–15.5)
WBC: 18.2 10*3/uL — AB (ref 4.0–10.5)

## 2016-12-15 NOTE — Progress Notes (Signed)
[name], female   DOB: 22-Dec-1975, 41 y.o.   MRN: [id number] 1 Day Post-Op   Subjective: Reports she is doing well. Just sore. No severe pain or nausea. Has been ambulating.  Objective: Vital signs in last 24 hours: Temp:  [97.9 F (36.6 C)-98.9 F (37.2 C)] 98.7 F (37.1 C) (09/15 0528) Pulse Rate:  [54-79] 58 (09/15 0528) Resp:  [10-19] 19 (09/15 0528) BP: (107-131)/(58-68) 113/62 (09/15 0528) SpO2:  [95 %-100 %] 95 % (09/15 0528)    Intake/Output from previous day: 09/14 0701 - 09/15 0700 In: 3533.8 [P.O.:240; I.V.:3293.8] Out: 2150 [Urine:2100; Blood:50] Intake/Output this shift: Total I/O In: 240 [P.O.:240] Out: 400 [Urine:400]  General appearance: alert, cooperative and no distress GI: Mild appropriate incisional tenderness Incision/Wound: No erythema or drainage  Lab Results:   Recent Labs  12/15/16 0514  WBC 18.2*  HGB 10.9*  HCT 33.1*  PLT 240   BMET  Recent Labs  12/15/16 0514  NA 140  K 3.8  CL 108  CO2 26  GLUCOSE 98  BUN 7  CREATININE 0.69  CALCIUM 8.5*     Studies/Results: No results found.  Anti-infectives: Anti-infectives    Start     Dose/Rate Route Frequency Ordered Stop   12/14/16 0646  vancomycin (VANCOCIN) 1-5 GM/200ML-% IVPB    Comments:  Carleene Cooper   : cabinet override      12/14/16 0646 12/14/16 0653   12/14/16 0600  vancomycin (VANCOCIN) IVPB 1000 mg/200 mL premix     1,000 mg 200 mL/hr over 60 Minutes Intravenous On call to O.R. 12/14/16 0514 12/14/16 0753      Assessment/Plan: s/p Procedure(s): OPEN REPAIR OF EPIGASTRIC HERNIA  OPEN REPAIR OF UMBILICAL HERNIA INSERTION OF MESH Doing well postoperatively without apparent complication. Has leukocytosis not unexpected postop day 1 but no evidence of problems. Okay for discharge.   LOS: 0 days    Lagina Reader T 12/15/2016

## 2016-12-15 NOTE — Progress Notes (Signed)
Reviewed AVS and discharge summary w/pt and husband. Verbalized understanding and provided teach back. Questions answered to their satisfaction. Pt discharged home via [place] in stable condition w/pain controlled, all belongings, and prescriptions.

## 2016-12-17 NOTE — Discharge Summary (Signed)
Physician Discharge Summary  [name] MRN: [id number] DOB/AGE: 42-Oct-1977 42 y.o.  Admit date: 12/14/2016 Discharge date: 12/17/2016  Admission Diagnoses: ventral hernia  Discharge Diagnoses:  Active Problems:   Hernia of abdominal wall   Discharged Condition: good  Hospital Course: Admitted for observation following open repair of ventral and umbilical hernia.   Consults: None  Significant Diagnostic Studies: labs: see epic  Treatments: surgery: open repair of ventral and umbilical hernia with retrorectus prolene mesh  Discharge Exam: Blood pressure 116/67, pulse 66, temperature 98.2 F (36.8 C), temperature source Oral, resp. rate 18, height 5\' 9"  (1.753 m), weight 98 kg (216 lb), last menstrual period 12/03/2016, SpO2 100 %. refer to rounding physician's note  Disposition: 01-Home or Self Care   Allergies as of 12/15/2016      Reactions   Penicillins Swelling, Rash   Childhood reaction and family history  Has patient had a PCN reaction causing immediate rash, facial/tongue/throat swelling, SOB or lightheadedness with hypotension: Unknown Has patient had a PCN reaction causing severe rash involving mucus membranes or skin necrosis: Unknown Has patient had a PCN reaction that required hospitalization: Unknown Has patient had a PCN reaction occurring within the last 10 years: No If all of the above answers are "NO", then may proceed with Cephalosporin use.      Medication List    TAKE these medications   docusate sodium 100 MG capsule Commonly known as:  COLACE Take 1 capsule (100 mg total) by mouth 2 (two) times daily.   gabapentin 300 MG capsule Commonly known as:  NEURONTIN Take 1 capsule (300 mg total) by mouth 3 (three) times daily. What changed:  when to take this   ibuprofen 200 MG tablet Commonly known as:  ADVIL,MOTRIN Take 800 mg by mouth at bedtime as needed for mild pain.   methocarbamol 500 MG tablet Commonly known as:   ROBAXIN Take 1 tablet (500 mg total) by mouth every 6 (six) hours as needed for muscle spasms.   omeprazole 20 MG capsule Commonly known as:  PRILOSEC Take 20 mg by mouth every other day.   oxyCODONE-acetaminophen 5-325 MG tablet Commonly known as:  PERCOCET/ROXICET Take 1 tablet by mouth every 6 (six) hours as needed for severe pain.            Discharge Care Instructions        Start     Ordered   12/14/16 0000  oxyCODONE-acetaminophen (PERCOCET/ROXICET) 5-325 MG tablet  Every 6 hours PRN     12/14/16 0856   12/14/16 0000  docusate sodium (COLACE) 100 MG capsule  2 times daily     12/14/16 0856   12/14/16 0000  methocarbamol (ROBAXIN) 500 MG tablet  Every 6 hours PRN     12/14/16 0856     Follow-up Information    Clovis Riley, MD Follow up in 2 week(s).   Specialty:  General Surgery Contact information: [phone]           Signed: Clovis Riley 12/17/2016, 9:04 AM

## 2017-01-07 ENCOUNTER — Encounter: Payer: Self-pay | Admitting: Family Medicine

## 2017-01-07 ENCOUNTER — Ambulatory Visit: Payer: 59 | Admitting: Family Medicine

## 2017-08-15 IMAGING — US US ABDOMEN LIMITED
1 series · 14 of 15 positions shown · non-contrast
Comparison: None.

CLINICAL DATA: 41-year-old female with possible hernia
supraumbilical region with pain and bulge for 3 years. Initial
encounter.

EXAM:
ULTRASOUND ABDOMEN LIMITED

[Series 1: us abdomen limited · 0.15mm/px · 14 of 15 slices shown]
[im 1/15]
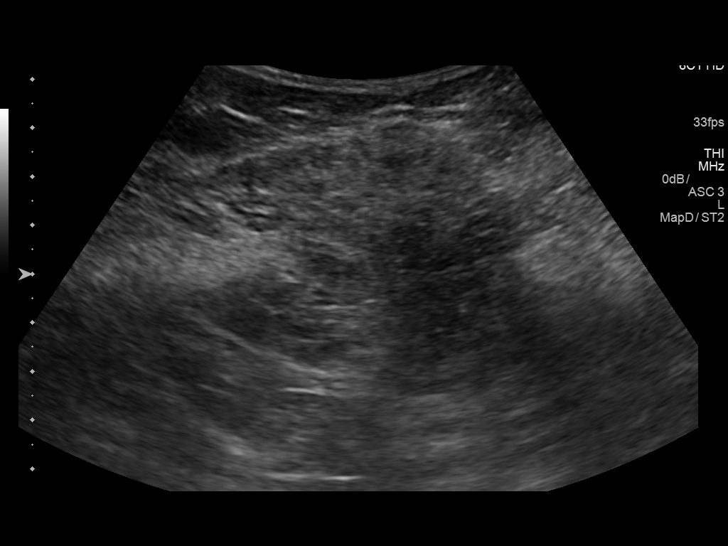
[im 2/15]
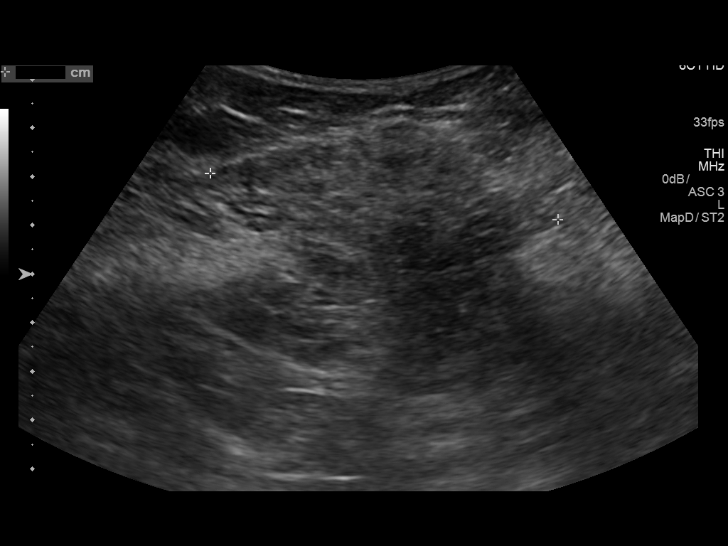
[im 3/15]
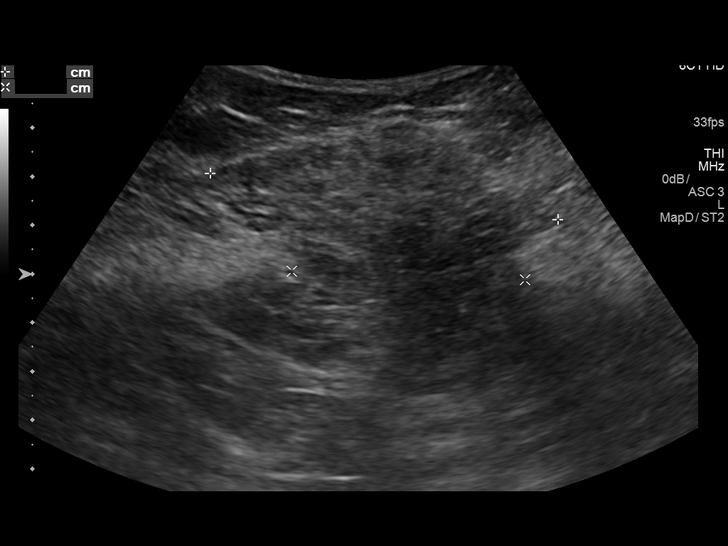
[im 4/15]
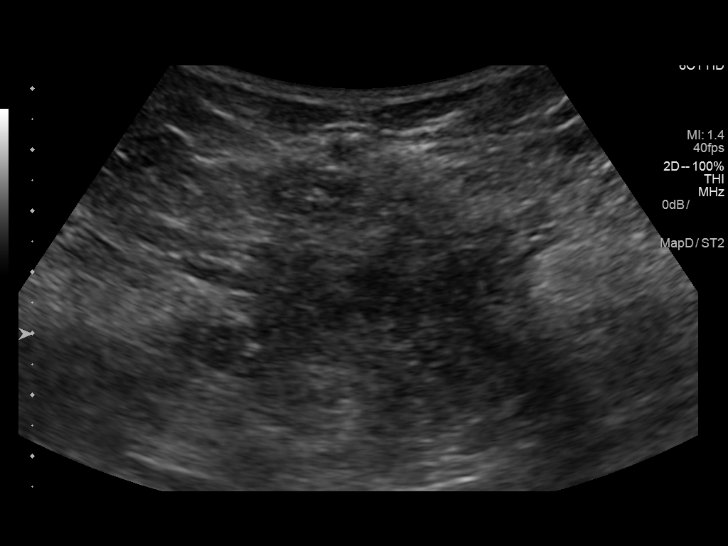
[im 5/15]
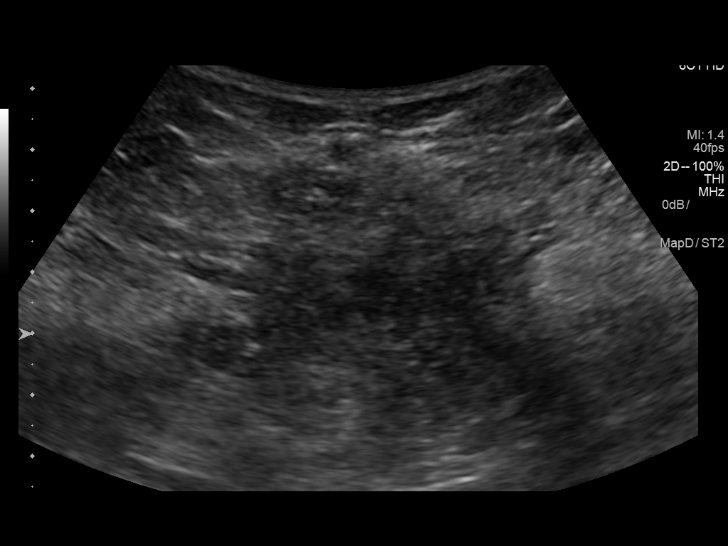
[im 6/15]
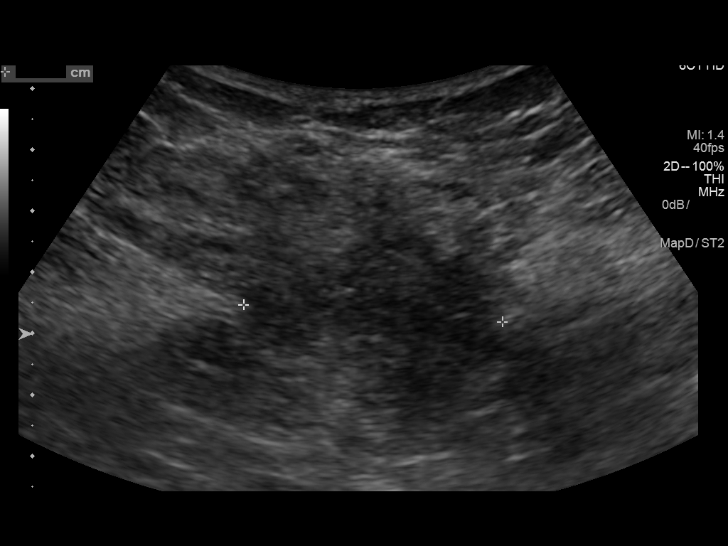
[im 7/15]
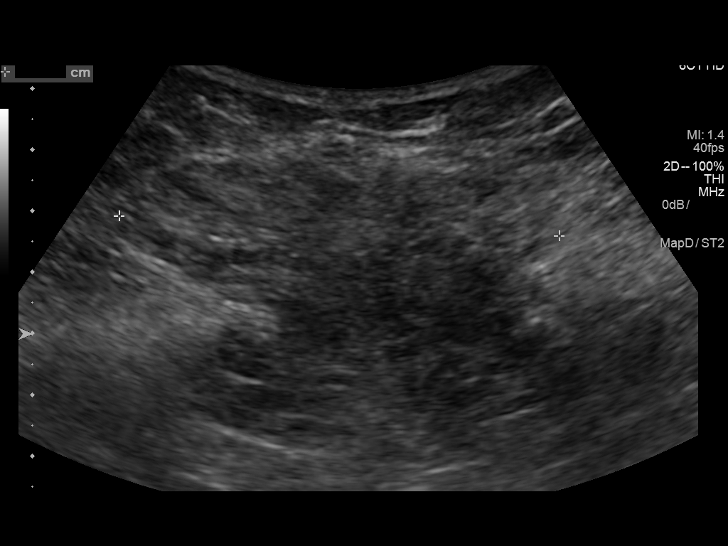
[im 9/15]
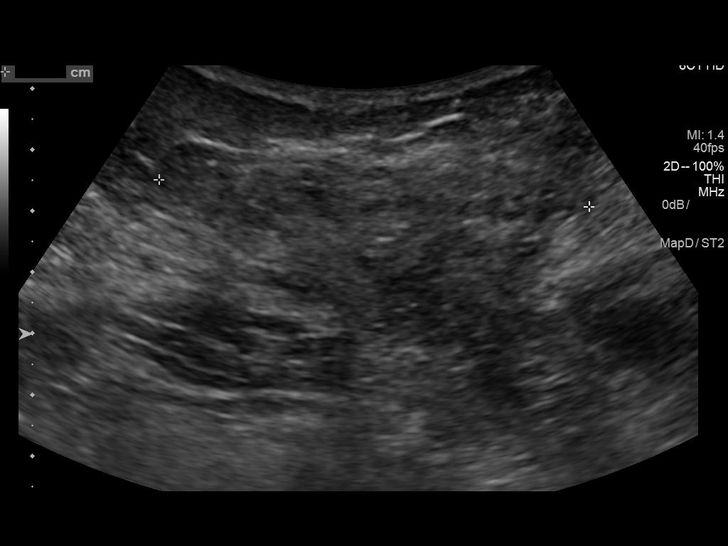
[im 10/15]
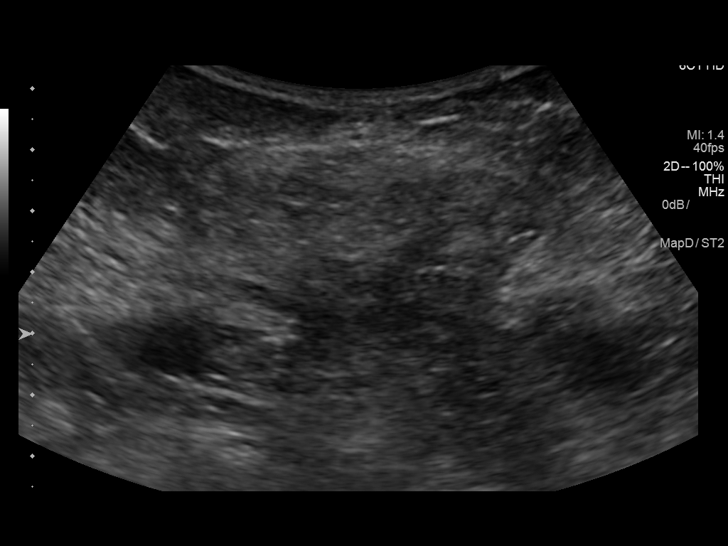
[im 11/15]
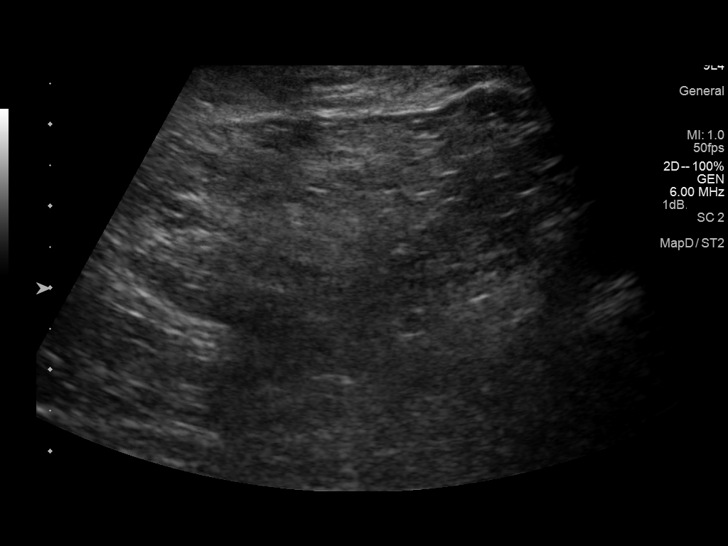
[im 12/15]
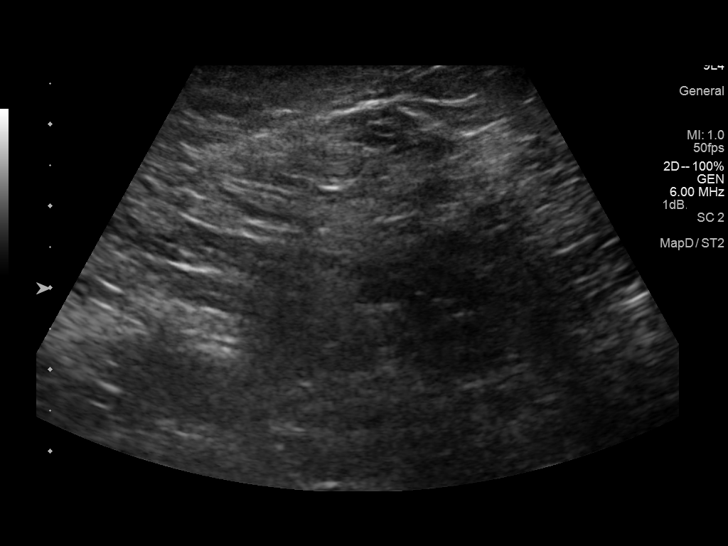
[im 13/15]
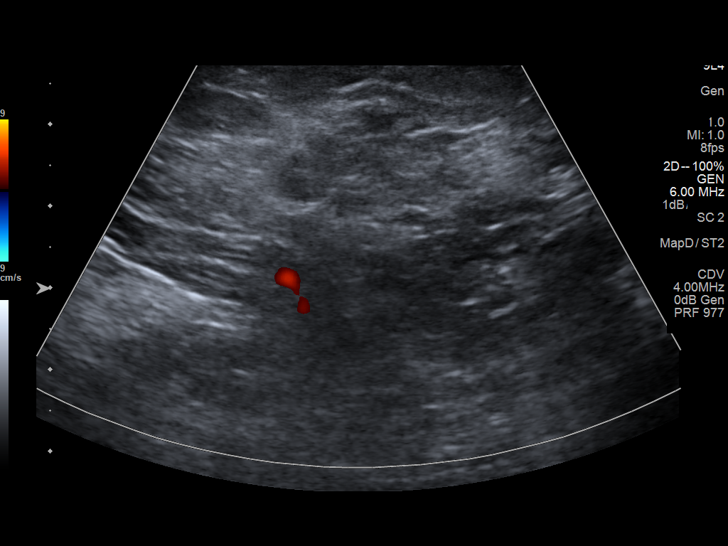
[im 14/15]
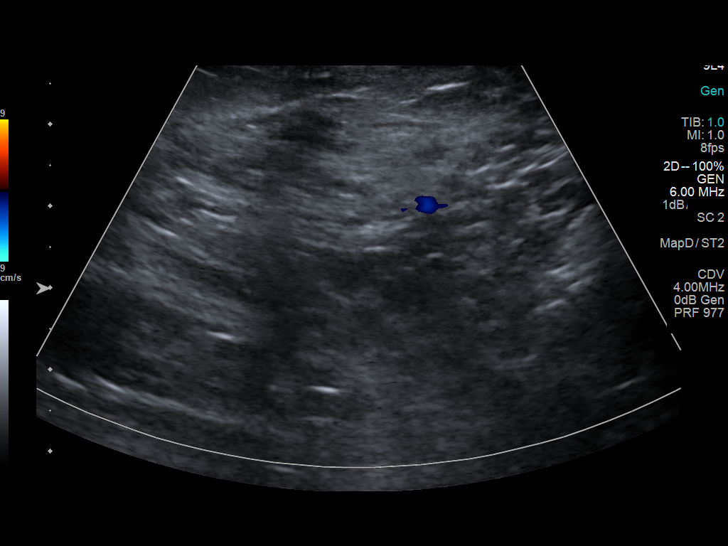
[im 15/15]
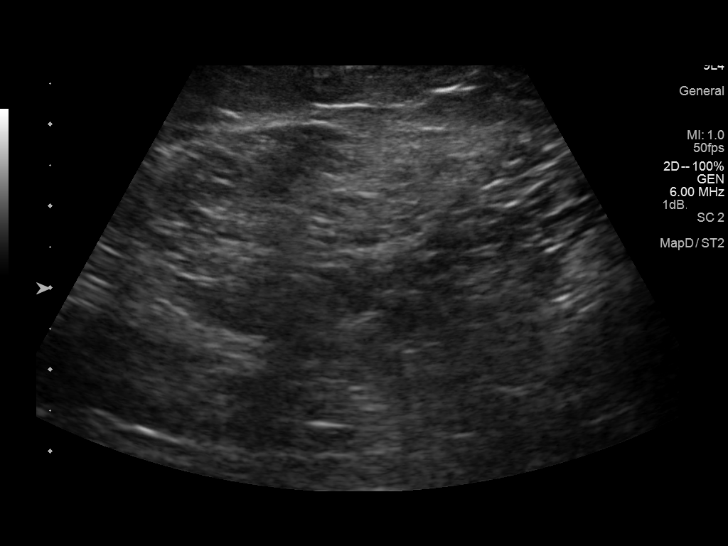

[14 of 15 positions shown; findings below may reference images not displayed]

FINDINGS: Supraumbilical hernia measures up to 7.2 cm with 4.8 cm neck. This
appears to be a fat containing hernia without findings to suggest
presence of bowel per ultrasound technologist. If further
delineation is clinically desired, CT imaging may then be
considered.
IMPRESSION: Supraumbilical hernia measures up to 7.2 cm appearing to contain
fat.

## 2017-09-17 ENCOUNTER — Other Ambulatory Visit: Payer: Self-pay

## 2017-09-17 ENCOUNTER — Emergency Department (HOSPITAL_COMMUNITY)
Admission: EM | Admit: 2017-09-17 | Discharge: 2017-09-17 | Disposition: A | Discharge status: Home / Self Care | Payer: PRIVATE HEALTH INSURANCE | Attending: Emergency Medicine | Admitting: Emergency Medicine

## 2017-09-17 ENCOUNTER — Encounter (HOSPITAL_COMMUNITY): Payer: Self-pay | Admitting: Emergency Medicine

## 2017-09-17 DIAGNOSIS — L03211 Cellulitis of face: Secondary | ICD-10-CM | POA: Diagnosis not present

## 2017-09-17 DIAGNOSIS — Z87891 Personal history of nicotine dependence: Secondary | ICD-10-CM | POA: Diagnosis not present

## 2017-09-17 DIAGNOSIS — R600 Localized edema: Secondary | ICD-10-CM | POA: Diagnosis present

## 2017-09-17 DIAGNOSIS — K047 Periapical abscess without sinus: Secondary | ICD-10-CM

## 2017-09-17 MED ORDER — CEPHALEXIN 500 MG PO CAPS: 500.0000 mg | ORAL_CAPSULE | Freq: Two times a day (BID) | ORAL | 0 refills | Status: AC

## 2017-09-17 MED ORDER — CLINDAMYCIN PALMITATE HCL 75 MG/5ML PO SOLR: 300.0000 mg | Freq: Four times a day (QID) | ORAL | 0 refills | Status: AC

## 2017-09-17 MED ORDER — FLUCONAZOLE 150 MG PO TABS: 150.0000 mg | ORAL_TABLET | Freq: Once | ORAL | 0 refills | Status: AC

## 2017-09-17 MED ORDER — METRONIDAZOLE 500 MG PO TABS: 500.0000 mg | ORAL_TABLET | Freq: Two times a day (BID) | ORAL | 0 refills | Status: AC

## 2017-09-17 MED ORDER — FLUCONAZOLE 150 MG PO TABS: 150.0000 mg | ORAL_TABLET | Freq: Once | ORAL | 0 refills | Status: DC

## 2017-09-17 NOTE — ED Provider Notes (Signed)
[facility] EMERGENCY DEPARTMENT Provider Note   CSN: [id number] Arrival date & time: 09/17/17  1216     History   Chief Complaint Chief Complaint  Patient presents with  . Oral Swelling    HPI Emily Boone is a 42 y.o. female.  HPI  42 year old female presents with right facial swelling.  She states she has had a broken tooth on her right maxilla for at least one year.  She thinks he might of gotten popcorn stuck in it a few days ago.  She is had some aching in this tooth until yesterday seem to be hurting more.  She woke up this morning with swelling to the right side of her face.  Is most prominent at her cheek.  No fevers, vomiting, shortness of breath, cough, trouble swallowing, or drooling.  She has taken some ibuprofen.  She has a Pharmacist, community but has not seen them in a while because she is to not have insurance but now does.  Past Medical History:  Diagnosis Date  . Anxiety   . Depression   . GERD (gastroesophageal reflux disease)   . History of kidney stones     Patient Active Problem List   Diagnosis Date Noted  . Hernia of abdominal wall 12/14/2016    Past Surgical History:  Procedure Laterality Date  . CHOLECYSTECTOMY    . EPIGASTRIC HERNIA REPAIR N/A 12/14/2016   Procedure: OPEN REPAIR OF EPIGASTRIC HERNIA ;  Surgeon: Clovis Riley, MD;  Location: [facility];  Service: General;  Laterality: N/A;  . HERNIA REPAIR    . INSERTION OF MESH N/A 12/14/2016   Procedure: INSERTION OF MESH;  Surgeon: Clovis Riley, MD;  Location: [facility];  Service: General;  Laterality: N/A;  . KNEE ARTHROSCOPY Right   . TUBAL LIGATION    . UMBILICAL HERNIA REPAIR N/A 12/14/2016   Procedure: OPEN REPAIR OF UMBILICAL HERNIA;  Surgeon: Clovis Riley, MD;  Location: [facility];  Service: General;  Laterality: N/A;     OB History   None      Home Medications    Prior to Admission medications   Medication Sig Start Date End Date Taking? Authorizing Provider  cephALEXin (KEFLEX)  500 MG capsule Take 1 capsule (500 mg total) by mouth 2 (two) times daily for 5 days. 09/17/17 09/22/17  Sherwood Gambler, MD  clindamycin (CLEOCIN) 75 MG/5ML solution Take 20 mLs (300 mg total) by mouth 4 (four) times daily for 5 days. 09/17/17 09/22/17  Sherwood Gambler, MD  fluconazole (DIFLUCAN) 150 MG tablet Take 1 tablet (150 mg total) by mouth once for 1 dose. 09/17/17 09/17/17  Sherwood Gambler, MD  gabapentin (NEURONTIN) 300 MG capsule Take 1 capsule (300 mg total) by mouth 3 (three) times daily. Patient taking differently: Take 300 mg by mouth at bedtime.  10/16/16   Timmothy Euler, MD  ibuprofen (ADVIL,MOTRIN) 200 MG tablet Take 800 mg by mouth at bedtime as needed for mild pain.     [provider]  methocarbamol (ROBAXIN) 500 MG tablet Take 1 tablet (500 mg total) by mouth every 6 (six) hours as needed for muscle spasms. 12/14/16   Clovis Riley, MD  metroNIDAZOLE (FLAGYL) 500 MG tablet Take 1 tablet (500 mg total) by mouth 2 (two) times daily for 5 days. 09/17/17 09/22/17  Sherwood Gambler, MD  omeprazole (PRILOSEC) 20 MG capsule Take 20 mg by mouth every other day.     [provider]  oxyCODONE-acetaminophen (PERCOCET/ROXICET) 5-325 MG tablet  Take 1 tablet by mouth every 6 (six) hours as needed for severe pain. 12/14/16   Clovis Riley, MD    Family History No family history on file.  Social History Social History   Tobacco Use  . Smoking status: Former Research scientist (life sciences)  . Smokeless tobacco: Never Used  Substance Use Topics  . Alcohol use: No  . Drug use: No     Allergies   Clindamycin/lincomycin and Penicillins   Review of Systems Review of Systems  Constitutional: Negative for fever.  HENT: Positive for dental problem and facial swelling. Negative for sore throat, trouble swallowing and voice change.   Respiratory: Negative for shortness of breath.   Gastrointestinal: Negative for vomiting.  All other systems reviewed and are negative.    Physical  Exam Updated Vital Signs BP 126/87 (BP Location: Left Arm)   Pulse 82   Temp 98 F (36.7 C) (Oral)   Resp 18   Ht 5\' 10"  (1.778 m)   Wt 95.3 kg (210 lb)   LMP 08/22/2017 (Exact Date)   SpO2 99%   BMI 30.13 kg/m   Physical Exam  Constitutional: She appears well-developed and well-nourished. No distress.  HENT:  Head: Normocephalic and atraumatic.    Right Ear: External ear normal.  Left Ear: External ear normal.  Nose: Nose normal.  Mouth/Throat: Dental caries present. No oropharyngeal exudate.    Eyes: Right eye exhibits no discharge. Left eye exhibits no discharge.  Neck: Normal range of motion. Neck supple.  Cardiovascular: Normal rate, regular rhythm and normal heart sounds.  Pulmonary/Chest: Effort normal and breath sounds normal. No stridor. She has no wheezes. She has no rales.  Abdominal: She exhibits no distension.  Neurological: She is alert.  Skin: Skin is warm and dry. She is not diaphoretic.  Nursing note and vitals reviewed.    ED Treatments / Results  Labs (all labs ordered are listed, but only abnormal results are displayed) Labs Reviewed - No data to display  EKG None  Radiology No results found.  Procedures Procedures (including critical care time)  Medications Ordered in ED Medications - No data to display   Initial Impression / Assessment and Plan / ED Course  I have reviewed the triage vital signs and the nursing notes.  Pertinent labs & imaging results that were available during my care of the patient were reviewed by me and considered in my medical decision making (see chart for details).     Patient does not appear significantly ill.  She is afebrile and has no airway signs or symptoms.  She has a history of what sounds like esophagitis from clindamycin use.  I talked with her pharmacist and liquid clindamycin probably should be okay.  She did not have any signs or symptoms of an allergic reaction or anaphylaxis.  She is willing to  try the liquid.  However given she is also allergic to penicillin, other options are less ideal.  I think Keflex/Flagyl would also be reasonable after discussion with our pharmacist as well.  I will give her these prescriptions so that if she cannot tolerate the clindamycin due to discomfort, she could switch to both of these.  However, there does not appear to be an obvious drainable abscess.  This just started today and the swelling is mild, I do not think CT scan would be beneficial as there is low likelihood of abscess.  I will have her follow-up with oral surgery as she needs this tooth removed.  We discussed  strict return precautions.  Final Clinical Impressions(s) / ED Diagnoses   Final diagnoses:  Dental abscess  Facial cellulitis    ED Discharge Orders        Ordered    cephALEXin (KEFLEX) 500 MG capsule  2 times daily     09/17/17 1322    metroNIDAZOLE (FLAGYL) 500 MG tablet  2 times daily     09/17/17 1322    clindamycin (CLEOCIN) 75 MG/5ML solution  4 times daily     09/17/17 1322    fluconazole (DIFLUCAN) 150 MG tablet   Once,   Status:  Discontinued     09/17/17 1335    fluconazole (DIFLUCAN) 150 MG tablet   Once     09/17/17 1335       Sherwood Gambler, MD 09/17/17 1600

## 2017-09-17 NOTE — Discharge Instructions (Signed)
Return to the ER immediately or call 911 if you develop trouble breathing, swallowing, talking, or if you notice drooling.  If you develop fever or other new/concerning symptoms return to the ER for evaluation.  Otherwise follow-up with your dentist or the oral surgeon in 2 days.  Take the antibiotics to completion, even if you are feeling better.

## 2017-09-17 NOTE — ED Triage Notes (Signed)
Pt presents with RT sided oral swelling. Swelling and redness noted from jaw to eye. Airway patent.

## 2018-01-27 ENCOUNTER — Ambulatory Visit: Payer: BLUE CROSS/BLUE SHIELD | Admitting: Family Medicine

## 2018-01-27 ENCOUNTER — Encounter: Payer: Self-pay | Admitting: Family Medicine

## 2018-01-27 VITALS — BP 126/86 | HR 70 | Temp 97.4°F | Ht 70.0 in | Wt 230.0 lb

## 2018-01-27 DIAGNOSIS — M797 Fibromyalgia: Secondary | ICD-10-CM

## 2018-01-27 DIAGNOSIS — L918 Other hypertrophic disorders of the skin: Secondary | ICD-10-CM

## 2018-01-27 DIAGNOSIS — D229 Melanocytic nevi, unspecified: Secondary | ICD-10-CM | POA: Diagnosis not present

## 2018-01-27 DIAGNOSIS — K219 Gastro-esophageal reflux disease without esophagitis: Secondary | ICD-10-CM | POA: Insufficient documentation

## 2018-01-27 DIAGNOSIS — M130 Polyarthritis, unspecified: Secondary | ICD-10-CM | POA: Insufficient documentation

## 2018-01-27 MED ORDER — DULOXETINE HCL 20 MG PO CPEP: 20.0000 mg | ORAL_CAPSULE | Freq: Every day | ORAL | 3 refills | Status: DC

## 2018-01-27 NOTE — Patient Instructions (Addendum)
Take 20 mg of Cymbalta daily for one week and then increase to 40 mg daily.    Mole A mole is a colored (pigmented) growth on the skin. Moles are very common. They are usually harmless, but some moles can become cancerous over time. What are the causes? Moles occur when pigmented skin cells grow together in clusters instead of spreading out in the skin as they normally do. The reason why the skin cells grow together in clusters is not known. What are the signs or symptoms? A mole may be:  [place] or black.  Flat or raised.  Smooth or wrinkled.  How is this diagnosed? A mole is diagnosed with a skin exam. If your health care provider thinks a mole may be cancerous, a piece of the mole will be removed for testing. How is this treated? Treatment is not needed unless a mole is cancerous. If a mole is cancerous, it will be removed. If a mole is causing pain or you do not like the way it looks, you may choose to have it removed. Follow these instructions at home:  Every month, look for new moles and check your existing moles for changes. This is important because a change in a mole can mean that the mole has become cancerous. Look for changes in: ? Size. Look for moles that are more than  in (0.64 cm) wide (in diameter). ? Shape. Look for moles that are not round or oval. ? Borders. Look for moles that are not symmetrical. ? Color. Note that it is normal for moles to get darker during pregnancy or when you take birth control pills.  When you are outdoors, wear sunscreen with SPF 30 (sun protection factor 30) or higher. Reapply the sunscreen every 2-3 hours.  If you have a large number of moles, see a skin doctor (dermatologist) at least one time every year. Contact a health care provider if:  The size, shape, borders, or color of your mole change.  Your mole, or the skin near the mole, becomes painful, sore, red, or swollen.  Your mole: ? Develops more than one color. ? Itches or  bleeds. ? Becomes scaly, sheds skin, or oozes fluid. ? Becomes flat or develops raised areas. ? Becomes hard or soft.  You develop a new mole. This information is not intended to replace advice given to you by your health care provider. Make sure you discuss any questions you have with your health care provider. Document Released: 12/12/2000 Document Revised: 08/31/2015 Document Reviewed: 01/07/2015 Elsevier Interactive Patient Education  [employer].

## 2018-01-27 NOTE — Progress Notes (Signed)
Subjective:    [name], female    DOB: Jun 03, 1975, 42 y.o.   MRN: [id number]  Chief Complaint:  Joint pains (hurts all over in different areas, sensitive to touch)   HPI: Emily Boone is a 42 y.o. female presenting on 01/27/2018 for Joint pains (hurts all over in different areas, sensitive to touch)  Pt presents today with continued widespread pain and skin tags that she would like removed. Pt states the widespread pain has been ongoing for several years, states it is worse when she is stressed. States she has tenderness to touch on her elbows, shoulders, and hands. States her knees and lower back hurt. States she was told she has fibromyalgia and has been managing her pain with gabapentin. States she only takes this at night because it makes her drowsy. States she has several skin tags to her neck and axilla that she would like removed. Pt states these have ben there for several months. States the one under her left arm is getting bigger and gets caught on her bra strap and hurts.   Relevant past medical, surgical, family, and social history reviewed and updated as indicated.  Allergies and medications reviewed and updated.   Past Medical History:  Diagnosis Date  . Anxiety   . Depression   . GERD (gastroesophageal reflux disease)   . History of kidney stones   . PTSD (post-traumatic stress disorder)     Past Surgical History:  Procedure Laterality Date  . CHOLECYSTECTOMY    . EPIGASTRIC HERNIA REPAIR N/A 12/14/2016   Procedure: OPEN REPAIR OF EPIGASTRIC HERNIA ;  Surgeon: Clovis Riley, MD;  Location: [facility];  Service: General;  Laterality: N/A;  . HERNIA REPAIR    . INSERTION OF MESH N/A 12/14/2016   Procedure: INSERTION OF MESH;  Surgeon: Clovis Riley, MD;  Location: [facility];  Service: General;  Laterality: N/A;  . KNEE ARTHROSCOPY Right   . TUBAL LIGATION    . UMBILICAL HERNIA REPAIR N/A 12/14/2016   Procedure: OPEN REPAIR OF UMBILICAL HERNIA;   Surgeon: Clovis Riley, MD;  Location: [facility];  Service: General;  Laterality: N/A;    Social History   Socioeconomic History  . Marital status: Married    Spouse name: Not on file  . Number of children: Not on file  . Years of education: Not on file  . Highest education level: Not on file  Occupational History  . Not on file  Social Needs  . Financial resource strain: Not on file  . Food insecurity:    Worry: Not on file    Inability: Not on file  . Transportation needs:    Medical: Not on file    Non-medical: Not on file  Tobacco Use  . Smoking status: Former Smoker    Packs/day: 0.10    Years: 17.00    Pack years: 1.70    Types: Cigarettes    Last attempt to quit: 09/01/2015    Years since quitting: 2.4  . Smokeless tobacco: Never Used  Substance and Sexual Activity  . Alcohol use: No  . Drug use: No  . Sexual activity: Not on file  Lifestyle  . Physical activity:    Days per week: Not on file    Minutes per session: Not on file  . Stress: Not on file  Relationships  . Social connections:    Talks on phone: Not on file    Gets together: Not on file  Attends religious service: Not on file    Active member of club or organization: Not on file    Attends meetings of clubs or organizations: Not on file    Relationship status: Not on file  . Intimate partner violence:    Fear of current or ex partner: Not on file    Emotionally abused: Not on file    Physically abused: Not on file    Forced sexual activity: Not on file  Other Topics Concern  . Not on file  Social History Narrative  . Not on file    Outpatient Encounter Medications as of 01/27/2018  Medication Sig  . gabapentin (NEURONTIN) 300 MG capsule Take 1 capsule (300 mg total) by mouth 3 (three) times daily. (Patient taking differently: Take 300 mg by mouth 3 times/day as needed-between meals & bedtime. )  . ibuprofen (ADVIL,MOTRIN) 200 MG tablet Take 800 mg by mouth at bedtime as needed for mild  pain.   . Multiple Vitamins-Minerals (MULTIVITAMIN ADULT) CHEW Chew 1 each by mouth daily.  Marland Kitchen omeprazole (PRILOSEC) 20 MG capsule Take 20 mg by mouth every other day.   . DULoxetine (CYMBALTA) 20 MG capsule Take 1 capsule (20 mg total) by mouth daily.  . [DISCONTINUED] methocarbamol (ROBAXIN) 500 MG tablet Take 1 tablet (500 mg total) by mouth every 6 (six) hours as needed for muscle spasms.  . [DISCONTINUED] oxyCODONE-acetaminophen (PERCOCET/ROXICET) 5-325 MG tablet Take 1 tablet by mouth every 6 (six) hours as needed for severe pain.   No facility-administered encounter medications on file as of 01/27/2018.     Allergies  Allergen Reactions  . Clindamycin/Lincomycin Other (See Comments)    "stuck in throat" cannot take pill form; can take as liquid  . Penicillins Swelling and Rash    Childhood reaction and family history  Has patient had a PCN reaction causing immediate rash, facial/tongue/throat swelling, SOB or lightheadedness with hypotension: Unknown Has patient had a PCN reaction causing severe rash involving mucus membranes or skin necrosis: Unknown Has patient had a PCN reaction that required hospitalization: Unknown Has patient had a PCN reaction occurring within the last 10 years: No If all of the above answers are "NO", then may proceed with Cephalosporin use.     Review of Systems  Constitutional: Positive for activity change and fatigue. Negative for chills and fever.  Eyes: Negative for photophobia and visual disturbance.  Respiratory: Negative for cough, chest tightness and shortness of breath.   Cardiovascular: Negative for chest pain, palpitations and leg swelling.  Gastrointestinal: Negative for abdominal pain, constipation, diarrhea, nausea and vomiting.  Endocrine: Negative for cold intolerance, heat intolerance, polydipsia, polyphagia and polyuria.  Genitourinary: Negative for decreased urine volume and difficulty urinating.  Musculoskeletal: Positive for  arthralgias, back pain, myalgias and neck pain. Negative for gait problem, joint swelling and neck stiffness.  Skin:       Skin tags, abnormal mole  Neurological: Negative for dizziness, tremors, weakness, numbness and headaches.  Psychiatric/Behavioral: Negative for agitation, behavioral problems, confusion and dysphoric mood.  All other systems reviewed and are negative.       Objective:    BP 126/86   Pulse 70   Temp (!) 97.4 F (36.3 C) (Oral)   Ht 5\' 10"  (1.778 m)   Wt 230 lb (104.3 kg)   BMI 33.00 kg/m    Wt Readings from Last 3 Encounters:  01/27/18 230 lb (104.3 kg)  09/17/17 210 lb (95.3 kg)  12/14/16 216 lb (98 kg)  Physical Exam  Constitutional: She is oriented to person, place, and time. She appears well-developed and well-nourished. No distress.  HENT:  Head: Normocephalic and atraumatic.  Cardiovascular: Normal rate, regular rhythm and normal heart sounds. Exam reveals no gallop and no friction rub.  No murmur heard. Pulmonary/Chest: Effort normal and breath sounds normal. No respiratory distress.  Musculoskeletal:  15 + tender points: Bilateral shoulders, bilateral elbows, bilateral hands, across upper back, across lower back, bilateral knees, bilateral lower legs, and bilateral feet. No joint swelling, erythema, crepitus, or deformity.   Neurological: She is alert and oriented to person, place, and time. She has normal strength. No cranial nerve deficit or sensory deficit.  Skin: Skin is warm and dry. Capillary refill takes less than 2 seconds. Lesion noted.  Greater than 1.5 cm dark colored mole to left axilla / upper flank, tender to touch. Several skin tags to axilla and neck of differing sizes.   Psychiatric: She has a normal mood and affect. Her speech is normal and behavior is normal. Judgment and thought content normal. Cognition and memory are normal.  Nursing note and vitals reviewed.     Pertinent labs & imaging results that were available  during my care of the patient were reviewed by me and considered in my medical decision making.  Assessment & Plan:  Alanda was seen today for joint pains.  Diagnoses and all orders for this visit:  Fibromyalgia Negative ANA panel last year. Continued widespread pain with fatigue. Takes gabapentin at night to help with symptoms, does not take during the day due to drowsiness. Will trial Cymbalta.  -     DULoxetine (CYMBALTA) 20 MG capsule; Take 1 capsule (20 mg total) by mouth daily.  Atypical mole 1.5 cm atypical mole to left axilla / upper flank.  -     Ambulatory referral to Dermatology  Skin tags, multiple acquired Multiple skin tags to axilla and neck.  -     Ambulatory referral to Dermatology      Continue all other maintenance medications.  Follow up plan: Return in about 3 months (around 04/29/2018) for complete physical exam.   Educational handout given for mole  The above assessment and management plan was discussed with the patient. The patient verbalized understanding of and has agreed to the management plan. Patient is aware to call the clinic if symptoms persist or worsen. Patient is aware when to return to the clinic for a follow-up visit. Patient educated on when it is appropriate to go to the emergency department.   Monia Pouch, FNP-C [facility] [phone]

## 2018-02-04 DIAGNOSIS — L820 Inflamed seborrheic keratosis: Secondary | ICD-10-CM | POA: Diagnosis not present

## 2018-02-04 DIAGNOSIS — D485 Neoplasm of uncertain behavior of skin: Secondary | ICD-10-CM | POA: Diagnosis not present

## 2018-02-04 DIAGNOSIS — D225 Melanocytic nevi of trunk: Secondary | ICD-10-CM | POA: Diagnosis not present

## 2018-02-04 DIAGNOSIS — Z1283 Encounter for screening for malignant neoplasm of skin: Secondary | ICD-10-CM | POA: Diagnosis not present

## 2018-05-01 ENCOUNTER — Encounter: Payer: Self-pay | Admitting: Family Medicine

## 2018-05-01 ENCOUNTER — Ambulatory Visit (INDEPENDENT_AMBULATORY_CARE_PROVIDER_SITE_OTHER): Payer: BLUE CROSS/BLUE SHIELD | Admitting: Family Medicine

## 2018-05-01 VITALS — BP 119/78 | HR 67 | Temp 97.9°F | Ht 70.0 in | Wt 230.0 lb

## 2018-05-01 DIAGNOSIS — J019 Acute sinusitis, unspecified: Secondary | ICD-10-CM

## 2018-05-01 DIAGNOSIS — B9689 Other specified bacterial agents as the cause of diseases classified elsewhere: Secondary | ICD-10-CM

## 2018-05-01 DIAGNOSIS — M797 Fibromyalgia: Secondary | ICD-10-CM | POA: Diagnosis not present

## 2018-05-01 DIAGNOSIS — Z0000 Encounter for general adult medical examination without abnormal findings: Secondary | ICD-10-CM

## 2018-05-01 DIAGNOSIS — Z0001 Encounter for general adult medical examination with abnormal findings: Secondary | ICD-10-CM

## 2018-05-01 DIAGNOSIS — E7800 Pure hypercholesterolemia, unspecified: Secondary | ICD-10-CM

## 2018-05-01 DIAGNOSIS — H6691 Otitis media, unspecified, right ear: Secondary | ICD-10-CM | POA: Diagnosis not present

## 2018-05-01 DIAGNOSIS — Z23 Encounter for immunization: Secondary | ICD-10-CM

## 2018-05-01 DIAGNOSIS — Z8619 Personal history of other infectious and parasitic diseases: Secondary | ICD-10-CM

## 2018-05-01 DIAGNOSIS — Z1239 Encounter for other screening for malignant neoplasm of breast: Secondary | ICD-10-CM | POA: Diagnosis not present

## 2018-05-01 DIAGNOSIS — Z6833 Body mass index (BMI) 33.0-33.9, adult: Secondary | ICD-10-CM

## 2018-05-01 MED ORDER — FLUTICASONE PROPIONATE 50 MCG/ACT NA SUSP: 2.0000 | Freq: Every day | NASAL | 6 refills | Status: DC

## 2018-05-01 MED ORDER — GABAPENTIN 300 MG PO CAPS
300.0000 mg | ORAL_CAPSULE | Freq: Two times a day (BID) | ORAL | 1 refills | Status: DC | PRN
Start: 2018-05-01 — End: 2018-08-15

## 2018-05-01 MED ORDER — CEFDINIR 300 MG PO CAPS: 300.0000 mg | ORAL_CAPSULE | Freq: Two times a day (BID) | ORAL | 0 refills | Status: AC

## 2018-05-01 MED ORDER — FLUCONAZOLE 150 MG PO TABS: 150.0000 mg | ORAL_TABLET | Freq: Once | ORAL | 0 refills | Status: AC

## 2018-05-01 NOTE — Progress Notes (Signed)
Subjective:    [name], female    DOB: 27-Sep-1975, 43 y.o.   MRN: [id number]  Chief Complaint:  Worthy Keeler   HPI: Emily Boone is a 43 y.o. female presenting on 05/01/2018 for Labwork  Pt presents today for her annual physical exam and lab work. Pt states she is currently on her menstrual cycle and would like to wait to have her PAP completed. Pt states she has been doing fairly well overall. States her GERD is well controlled with the omeprazole. States she stopped taking the Cymbalta for her fibromyalgia, states it made her dizzy and tired. States she is taking the gabapentin which helps with her symptoms. Pt states she has had sinus pressure and right ear pain for the last 8 days. States the has been using Flonase without relief of the symptoms. She has had low grade fevers and chills with the symptoms. She states she went to dermatology for the mole on her back, states they froze the area but the mole did not fall off. States she would like this removed when she comes back for her PAP. She has not had a mammogram. States her maternal grandmother had breast cancer and her paternal aunt had ovarian cancer. She denies first degree relatives with cancer. She does try to watch her diet and exercise as much as she can. States she is eating healthier since her husband was diagnosed with DM2.   Relevant past medical, surgical, family, and social history reviewed and updated as indicated.  Allergies and medications reviewed and updated.   Past Medical History:  Diagnosis Date  . Anxiety   . Depression   . GERD (gastroesophageal reflux disease)   . History of kidney stones   . PTSD (post-traumatic stress disorder)     Past Surgical History:  Procedure Laterality Date  . CHOLECYSTECTOMY    . EPIGASTRIC HERNIA REPAIR N/A 12/14/2016   Procedure: OPEN REPAIR OF EPIGASTRIC HERNIA ;  Surgeon: Clovis Riley, MD;  Location: [facility];  Service: General;  Laterality: N/A;  .  HERNIA REPAIR    . INSERTION OF MESH N/A 12/14/2016   Procedure: INSERTION OF MESH;  Surgeon: Clovis Riley, MD;  Location: [facility];  Service: General;  Laterality: N/A;  . KNEE ARTHROSCOPY Right   . TUBAL LIGATION    . UMBILICAL HERNIA REPAIR N/A 12/14/2016   Procedure: OPEN REPAIR OF UMBILICAL HERNIA;  Surgeon: Clovis Riley, MD;  Location: [facility];  Service: General;  Laterality: N/A;    Social History   Socioeconomic History  . Marital status: Married    Spouse name: Not on file  . Number of children: Not on file  . Years of education: Not on file  . Highest education level: Not on file  Occupational History  . Not on file  Social Needs  . Financial resource strain: Not on file  . Food insecurity:    Worry: Not on file    Inability: Not on file  . Transportation needs:    Medical: Not on file    Non-medical: Not on file  Tobacco Use  . Smoking status: Former Smoker    Packs/day: 0.10    Years: 17.00    Pack years: 1.70    Types: Cigarettes    Last attempt to quit: 09/01/2015    Years since quitting: 2.6  . Smokeless tobacco: Never Used  Substance and Sexual Activity  . Alcohol use: No  . Drug use: No  .  Sexual activity: Not on file  Lifestyle  . Physical activity:    Days per week: Not on file    Minutes per session: Not on file  . Stress: Not on file  Relationships  . Social connections:    Talks on phone: Not on file    Gets together: Not on file    Attends religious service: Not on file    Active member of club or organization: Not on file    Attends meetings of clubs or organizations: Not on file    Relationship status: Not on file  . Intimate partner violence:    Fear of current or ex partner: Not on file    Emotionally abused: Not on file    Physically abused: Not on file    Forced sexual activity: Not on file  Other Topics Concern  . Not on file  Social History Narrative  . Not on file    Outpatient Encounter Medications as of 05/01/2018    Medication Sig  . cefdinir (OMNICEF) 300 MG capsule Take 1 capsule (300 mg total) by mouth 2 (two) times daily for 7 days. 1 po BID  . fluconazole (DIFLUCAN) 150 MG tablet Take 1 tablet (150 mg total) by mouth once for 1 dose.  . fluticasone (FLONASE) 50 MCG/ACT nasal spray Place 2 sprays into both nostrils daily.  Marland Kitchen gabapentin (NEURONTIN) 300 MG capsule Take 1 capsule (300 mg total) by mouth 3 times/day as needed-between meals & bedtime.  Marland Kitchen ibuprofen (ADVIL,MOTRIN) 200 MG tablet Take 800 mg by mouth at bedtime as needed for mild pain.   . Multiple Vitamins-Minerals (MULTIVITAMIN ADULT) CHEW Chew 1 each by mouth daily.  Marland Kitchen omeprazole (PRILOSEC) 20 MG capsule Take 20 mg by mouth every other day.   . [DISCONTINUED] DULoxetine (CYMBALTA) 20 MG capsule Take 1 capsule (20 mg total) by mouth daily.  . [DISCONTINUED] gabapentin (NEURONTIN) 300 MG capsule Take 1 capsule (300 mg total) by mouth 3 (three) times daily. (Patient taking differently: Take 300 mg by mouth 3 times/day as needed-between meals & bedtime. )   No facility-administered encounter medications on file as of 05/01/2018.     Allergies  Allergen Reactions  . Clindamycin/Lincomycin Other (See Comments)    "stuck in throat" cannot take pill form; can take as liquid  . Penicillins Swelling and Rash    Childhood reaction and family history  Has patient had a PCN reaction causing immediate rash, facial/tongue/throat swelling, SOB or lightheadedness with hypotension: Unknown Has patient had a PCN reaction causing severe rash involving mucus membranes or skin necrosis: Unknown Has patient had a PCN reaction that required hospitalization: Unknown Has patient had a PCN reaction occurring within the last 10 years: No If all of the above answers are "NO", then may proceed with Cephalosporin use.     Review of Systems  Constitutional: Positive for fatigue. Negative for chills and fever.  HENT: Positive for congestion, ear pain, postnasal  drip, rhinorrhea, sinus pressure and sinus pain.   Eyes: Negative for photophobia and visual disturbance.  Respiratory: Negative for cough, chest tightness and shortness of breath.   Cardiovascular: Negative for chest pain, palpitations and leg swelling.  Gastrointestinal: Negative for abdominal pain, anal bleeding, blood in stool, constipation, diarrhea, nausea, rectal pain and vomiting.  Genitourinary: Negative.   Musculoskeletal: Positive for arthralgias and myalgias. Negative for back pain, gait problem, joint swelling, neck pain and neck stiffness.  Neurological: Negative for dizziness, tremors, seizures, syncope, facial asymmetry, speech difficulty, weakness, light-headedness,  numbness and headaches.  Psychiatric/Behavioral: Negative for confusion and dysphoric mood.  All other systems reviewed and are negative.       Objective:    BP 119/78   Pulse 67   Temp 97.9 F (36.6 C) (Oral)   Ht _0  (1.778 m)   Wt 230 lb (104.3 kg)   LMP 05/01/2018 (Exact Date)   BMI 33.00 kg/m    Wt Readings from Last 3 Encounters:  05/01/18 230 lb (104.3 kg)  01/27/18 230 lb (104.3 kg)  09/17/17 210 lb (95.3 kg)    Physical Exam Vitals signs and nursing note reviewed.  Constitutional:      General: She is not in acute distress.    Appearance: Normal appearance. She is well-developed and well-groomed. She is obese. She is not ill-appearing or toxic-appearing.  HENT:     Head: Normocephalic and atraumatic.     Jaw: There is normal jaw occlusion.     Right Ear: Hearing, ear canal and external ear normal. Tympanic membrane is erythematous. Tympanic membrane is not perforated.     Left Ear: Hearing, ear canal and external ear normal. A middle ear effusion is present. Tympanic membrane is not perforated.     Nose: Congestion and rhinorrhea present. Rhinorrhea is clear.     Right Turbinates: Swollen.     Left Turbinates: Swollen.     Right Sinus: Maxillary sinus tenderness and frontal sinus  tenderness present.     Left Sinus: Maxillary sinus tenderness and frontal sinus tenderness present.     Mouth/Throat:     Lips: Pink.     Mouth: Mucous membranes are moist.     Pharynx: Posterior oropharyngeal erythema present. No pharyngeal swelling, oropharyngeal exudate or uvula swelling.     Tonsils: No tonsillar exudate or tonsillar abscesses.     Comments: Postnasal drainage Eyes:     General: Lids are normal.     Extraocular Movements: Extraocular movements intact.     Conjunctiva/sclera: Conjunctivae normal.     Pupils: Pupils are equal, round, and reactive to light.     Visual Fields: Right eye visual fields normal and left eye visual fields normal.  Neck:     Musculoskeletal: Full passive range of motion without pain, normal range of motion and neck supple.     Thyroid: No thyroid mass, thyromegaly or thyroid tenderness.     Vascular: No carotid bruit or JVD.     Trachea: Trachea and phonation normal.  Cardiovascular:     Rate and Rhythm: Normal rate and regular rhythm.     Pulses: Normal pulses.     Heart sounds: Normal heart sounds. No murmur. No friction rub. No gallop.   Pulmonary:     Effort: Pulmonary effort is normal. No respiratory distress.     Breath sounds: Normal breath sounds.  Chest:     Breasts:        Right: Normal.        Left: Normal.  Abdominal:     General: Abdomen is protuberant. Bowel sounds are normal.     Palpations: Abdomen is soft. There is no hepatomegaly or splenomegaly.     Tenderness: There is no abdominal tenderness.  Lymphadenopathy:     Cervical: No cervical adenopathy.  Skin:    General: Skin is warm and dry.     Capillary Refill: Capillary refill takes less than 2 seconds.  Neurological:     General: No focal deficit present.     Mental Status: She is  alert and oriented to person, place, and time.     Cranial Nerves: Cranial nerves are intact.     Sensory: Sensation is intact.     Motor: Motor function is intact.      Coordination: Coordination is intact.     Gait: Gait is intact.     Deep Tendon Reflexes: Reflexes are normal and symmetric.  Psychiatric:        Attention and Perception: Attention and perception normal.        Mood and Affect: Mood and affect normal.        Speech: Speech normal.        Behavior: Behavior normal. Behavior is cooperative.        Thought Content: Thought content normal.        Cognition and Memory: Cognition and memory normal.        Judgment: Judgment normal.     Results for orders placed or performed during the hospital encounter of 12/14/16  Pregnancy, urine  Result Value Ref Range   Preg Test, Ur NEGATIVE NEGATIVE  CBC  Result Value Ref Range   WBC 18.2 (H) 4.0 - 10.5 K/uL   RBC 3.76 (L) 3.87 - 5.11 MIL/uL   Hemoglobin 10.9 (L) 12.0 - 15.0 g/dL   HCT 33.1 (L) 36.0 - 46.0 %   MCV 88.0 78.0 - 100.0 fL   MCH 29.0 26.0 - 34.0 pg   MCHC 32.9 30.0 - 36.0 g/dL   RDW 14.3 11.5 - 15.5 %   Platelets 240 150 - 400 K/uL  Basic metabolic panel  Result Value Ref Range   Sodium 140 135 - 145 mmol/L   Potassium 3.8 3.5 - 5.1 mmol/L   Chloride 108 101 - 111 mmol/L   CO2 26 22 - 32 mmol/L   Glucose, Bld 98 65 - 99 mg/dL   BUN 7 6 - 20 mg/dL   Creatinine, Ser 0.69 0.44 - 1.00 mg/dL   Calcium 8.5 (L) 8.9 - 10.3 mg/dL   GFR calc non Af Amer >60 >60 mL/min   GFR calc Af Amer >60 >60 mL/min   Anion gap 6 5 - 15       Pertinent labs & imaging results that were available during my care of the patient were reviewed by me and considered in my medical decision making.  Assessment & Plan:  Emily Boone was seen today for labwork.  Diagnoses and all orders for this visit:  Annual physical exam Health maintenance discussed. Diet and exercise encouraged.  -     CBC with Differential/Platelet -     CMP14+EGFR -     HIV Antibody (routine testing w rflx) -     Lipid panel -     TSH -     Tdap vaccine greater than or equal to 7yo IM  Fibromyalgia Continue gabapentin. Report  any new or worsening symptoms. May try Lyrica if symptoms progress.  -     gabapentin (NEURONTIN) 300 MG capsule; Take 1 capsule (300 mg total) by mouth 3 times/day as needed-between meals & bedtime.  Screening for breast cancer Mammogram on bus scheduled.  Acute right otitis media Medications as prescribed. Report any new or worsening symptoms.  -     cefdinir (OMNICEF) 300 MG capsule; Take 1 capsule (300 mg total) by mouth 2 (two) times daily for 7 days. 1 po BID  Acute bacterial rhinosinusitis Medications as prescribed. Report any new or worsening symptoms.  -     fluticasone (FLONASE)  50 MCG/ACT nasal spray; Place 2 sprays into both nostrils daily. -     cefdinir (OMNICEF) 300 MG capsule; Take 1 capsule (300 mg total) by mouth 2 (two) times daily for 7 days. 1 po BID  History of candidal vulvovaginitis -     fluconazole (DIFLUCAN) 150 MG tablet; Take 1 tablet (150 mg total) by mouth once for 1 dose.  Encounter for immunization -     Tdap vaccine greater than or equal to 7yo IM     Continue all other maintenance medications.  Follow up plan: Return in about 2 weeks (around 05/15/2018), or if symptoms worsen or fail to improve, for PAP.  Educational handout given for health maintenance  The above assessment and management plan was discussed with the patient. The patient verbalized understanding of and has agreed to the management plan. Patient is aware to call the clinic if symptoms persist or worsen. Patient is aware when to return to the clinic for a follow-up visit. Patient educated on when it is appropriate to go to the emergency department.   Monia Pouch, FNP-C [facility] [phone]

## 2018-05-01 NOTE — Patient Instructions (Signed)

## 2018-05-02 LAB — CMP14+EGFR
A/G RATIO: 1.5 (ref 1.2–2.2)
ALT: 10 IU/L (ref 0–32)
AST: 16 IU/L (ref 0–40)
Albumin: 4 g/dL (ref 3.8–4.8)
Alkaline Phosphatase: 61 IU/L (ref 39–117)
BUN/Creatinine Ratio: 12 (ref 9–23)
BUN: 9 mg/dL (ref 6–24)
Bilirubin Total: 0.3 mg/dL (ref 0.0–1.2)
CALCIUM: 9 mg/dL (ref 8.7–10.2)
CHLORIDE: 105 mmol/L (ref 96–106)
CO2: 22 mmol/L (ref 20–29)
CREATININE: 0.73 mg/dL (ref 0.57–1.00)
GFR, EST AFRICAN AMERICAN: 117 mL/min/{1.73_m2} (ref >59)
GFR, EST NON AFRICAN AMERICAN: 101 mL/min/{1.73_m2} (ref >59)
Globulin, Total: 2.6 g/dL (ref 1.5–4.5)
Glucose: 88 mg/dL (ref 65–99)
POTASSIUM: 4.4 mmol/L (ref 3.5–5.2)
SODIUM: 142 mmol/L (ref 134–144)
Total Protein: 6.6 g/dL (ref 6.0–8.5)

## 2018-05-02 LAB — CBC WITH DIFFERENTIAL/PLATELET
Basophils Absolute: 0.1 10*3/uL (ref 0.0–0.2)
Basos: 1 %
EOS (ABSOLUTE): 0.2 10*3/uL (ref 0.0–0.4)
Eos: 3 %
Hematocrit: 36.7 % (ref 34.0–46.6)
Hemoglobin: 11.6 g/dL (ref 11.1–15.9)
IMMATURE GRANULOCYTES: 0 %
Immature Grans (Abs): 0 10*3/uL (ref 0.0–0.1)
Lymphocytes Absolute: 2.3 10*3/uL (ref 0.7–3.1)
Lymphs: 27 %
MCH: 26.5 pg — ABNORMAL LOW (ref 26.6–33.0)
MCHC: 31.6 g/dL (ref 31.5–35.7)
MCV: 84 fL (ref 79–97)
MONOS ABS: 0.7 10*3/uL (ref 0.1–0.9)
Monocytes: 8 %
NEUTROS PCT: 61 %
Neutrophils Absolute: 5.1 10*3/uL (ref 1.4–7.0)
PLATELETS: 301 10*3/uL (ref 150–450)
RBC: 4.38 x10E6/uL (ref 3.77–5.28)
RDW: 13.9 % (ref 11.7–15.4)
WBC: 8.4 10*3/uL (ref 3.4–10.8)

## 2018-05-02 LAB — LIPID PANEL
CHOL/HDL RATIO: 7.3 ratio — AB (ref 0.0–4.4)
Cholesterol, Total: 262 mg/dL — ABNORMAL HIGH (ref 100–199)
HDL: 36 mg/dL — ABNORMAL LOW (ref >39)
LDL CALC: 205 mg/dL — AB (ref 0–99)
TRIGLYCERIDES: 106 mg/dL (ref 0–149)
VLDL Cholesterol Cal: 21 mg/dL (ref 5–40)

## 2018-05-02 LAB — TSH: TSH: 2.34 u[IU]/mL (ref 0.450–4.500)

## 2018-05-02 LAB — HIV ANTIBODY (ROUTINE TESTING W REFLEX): HIV Screen 4th Generation wRfx: NONREACTIVE

## 2018-05-02 MED ORDER — ATORVASTATIN CALCIUM 20 MG PO TABS: 20.0000 mg | ORAL_TABLET | Freq: Every day | ORAL | 3 refills | Status: DC

## 2018-05-02 NOTE — Addendum Note (Signed)
Addended by: Baruch Gouty on: 05/02/2018 11:35 AM   Modules accepted: Orders

## 2018-05-06 ENCOUNTER — Encounter: Payer: Self-pay | Admitting: *Deleted

## 2018-05-12 ENCOUNTER — Ambulatory Visit (INDEPENDENT_AMBULATORY_CARE_PROVIDER_SITE_OTHER): Payer: BLUE CROSS/BLUE SHIELD | Admitting: Family Medicine

## 2018-05-12 ENCOUNTER — Encounter: Payer: Self-pay | Admitting: Family Medicine

## 2018-05-12 VITALS — BP 122/75 | HR 59 | Temp 97.6°F | Ht 70.0 in | Wt 223.0 lb

## 2018-05-12 DIAGNOSIS — D485 Neoplasm of uncertain behavior of skin: Secondary | ICD-10-CM | POA: Diagnosis not present

## 2018-05-12 DIAGNOSIS — L821 Other seborrheic keratosis: Secondary | ICD-10-CM | POA: Diagnosis not present

## 2018-05-12 DIAGNOSIS — Z124 Encounter for screening for malignant neoplasm of cervix: Secondary | ICD-10-CM | POA: Diagnosis not present

## 2018-05-12 DIAGNOSIS — R87612 Low grade squamous intraepithelial lesion on cytologic smear of cervix (LGSIL): Secondary | ICD-10-CM | POA: Diagnosis not present

## 2018-05-12 DIAGNOSIS — D224 Melanocytic nevi of scalp and neck: Secondary | ICD-10-CM | POA: Diagnosis not present

## 2018-05-12 DIAGNOSIS — D229 Melanocytic nevi, unspecified: Secondary | ICD-10-CM

## 2018-05-12 NOTE — Patient Instructions (Signed)
Excision of Skin Lesions, Care After  This sheet gives you information about how to care for yourself after your procedure. Your health care provider may also give you more specific instructions. If you have problems or questions, contact your health care provider.  What can I expect after the procedure?  After your procedure, it is common to have pain or discomfort at the excision site.  Follow these instructions at home:  Excision care       · Follow instructions from your health care provider about how to take care of your excision site. Make sure you:  ? Wash your hands with soap and water before and after you change your bandage (dressing). If soap and water are not available, use hand sanitizer.  ? Change your dressing as told by your health care provider.  ? Leave stitches (sutures), skin glue, or adhesive strips in place. These skin closures may need to stay in place for 2 weeks or longer. If adhesive strip edges start to loosen and curl up, you may trim the loose edges. Do not remove adhesive strips completely unless your health care provider tells you to do that.  · Check the excision area every day for signs of infection. Watch for:  ? Redness, swelling, or pain.  ? Fluid or blood.  ? Warmth.  ? Pus or a bad smell.  · Keep the site clean, dry, and protected for at least 48 hours.  · For bleeding, apply gentle but firm pressure to the area using a folded towel for 20 minutes.  · Avoid high-impact exercise and activities until the sutures are removed or the area heals.  General instructions  · Take over-the-counter and prescription medicines only as told by your health care provider.  · Follow instructions from your health care provider about how to minimize scarring. Scarring should lessen over time.  · Avoid sun exposure until the area has healed. Use sunscreen to protect the area from the sun after it has healed.  · Keep all follow-up visits as told by your health care provider. This is  important.  Contact a health care provider if:  · You have redness, swelling, or pain around your excision site.  · You have fluid or blood coming from your excision site.  · Your excision site feels warm to the touch.  · You have pus or a bad smell coming from your excision site.  · You have a fever.  · You have pain that does not improve in 2-3 days after your procedure.  · You notice skin irregularities or changes in how you feel (sensation).  Summary  · This sheet of instructions provides you with information about caring for yourself after your procedure. Contact your health care provider if you have any problems or questions.  · Take over-the-counter and prescription medicines only as told by your health care provider.  · Change your dressing as told by your health care provider.  · Contact a health care provider if you have redness, swelling, pain, or other signs of infection around your excision site.  · Keep all follow-up visits as told by your health care provider. This is important.  This information is not intended to replace advice given to you by your health care provider. Make sure you discuss any questions you have with your health care provider.  Document Released: 08/03/2014 Document Revised: 09/25/2017 Document Reviewed: 09/25/2017  Elsevier Interactive Patient Education © 2019 [employer]   

## 2018-05-12 NOTE — Progress Notes (Signed)
Subjective:  [name], female    DOB: 06-Jun-1975, 43 y.o.   MRN: [id number]  Chief Complaint:  Gynecologic Exam and Mole removal   HPI: Emily Boone is a 43 y.o. female presenting on 05/12/2018 for Gynecologic Exam and Mole removal   1. Atypical mole  Pt has several atypical moles to right lateral neck and one large atypical mole to left axilla. Pt states she went to dermatology and they tried to cryo the large lesion to her left axilla but it did not work. Pt states the mole with bleed at times and is tender at times. She states the moles on her neck have increased in size and have changes color. She states two will bleed and are tender.   2. Pap smear for cervical cancer screening  Pt returns today for her PAP, she had her annual physical, she was menstruating at that time and opted to return for PAP.      Relevant past medical, surgical, family, and social history reviewed and updated as indicated.  Allergies and medications reviewed and updated.   Past Medical History:  Diagnosis Date  . Anxiety   . Depression   . GERD (gastroesophageal reflux disease)   . History of kidney stones   . PTSD (post-traumatic stress disorder)     Past Surgical History:  Procedure Laterality Date  . CHOLECYSTECTOMY    . EPIGASTRIC HERNIA REPAIR N/A 12/14/2016   Procedure: OPEN REPAIR OF EPIGASTRIC HERNIA ;  Surgeon: Clovis Riley, MD;  Location: [facility];  Service: General;  Laterality: N/A;  . HERNIA REPAIR    . INSERTION OF MESH N/A 12/14/2016   Procedure: INSERTION OF MESH;  Surgeon: Clovis Riley, MD;  Location: [facility];  Service: General;  Laterality: N/A;  . KNEE ARTHROSCOPY Right   . TUBAL LIGATION    . UMBILICAL HERNIA REPAIR N/A 12/14/2016   Procedure: OPEN REPAIR OF UMBILICAL HERNIA;  Surgeon: Clovis Riley, MD;  Location: [facility];  Service: General;  Laterality: N/A;    Social History   Socioeconomic History  . Marital status: Married    Spouse  name: Not on file  . Number of children: Not on file  . Years of education: Not on file  . Highest education level: Not on file  Occupational History  . Not on file  Social Needs  . Financial resource strain: Not on file  . Food insecurity:    Worry: Not on file    Inability: Not on file  . Transportation needs:    Medical: Not on file    Non-medical: Not on file  Tobacco Use  . Smoking status: Former Smoker    Packs/day: 0.10    Years: 17.00    Pack years: 1.70    Types: Cigarettes    Last attempt to quit: 09/01/2015    Years since quitting: 2.6  . Smokeless tobacco: Never Used  Substance and Sexual Activity  . Alcohol use: No  . Drug use: No  . Sexual activity: Not on file  Lifestyle  . Physical activity:    Days per week: Not on file    Minutes per session: Not on file  . Stress: Not on file  Relationships  . Social connections:    Talks on phone: Not on file    Gets together: Not on file    Attends religious service: Not on file    Active member of club or organization: Not on file  Attends meetings of clubs or organizations: Not on file    Relationship status: Not on file  . Intimate partner violence:    Fear of current or ex partner: Not on file    Emotionally abused: Not on file    Physically abused: Not on file    Forced sexual activity: Not on file  Other Topics Concern  . Not on file  Social History Narrative  . Not on file    Outpatient Encounter Medications as of 05/12/2018  Medication Sig  . atorvastatin (LIPITOR) 20 MG tablet Take 1 tablet (20 mg total) by mouth daily.  . fluticasone (FLONASE) 50 MCG/ACT nasal spray Place 2 sprays into both nostrils daily.  Marland Kitchen gabapentin (NEURONTIN) 300 MG capsule Take 1 capsule (300 mg total) by mouth 3 times/day as needed-between meals & bedtime.  Marland Kitchen ibuprofen (ADVIL,MOTRIN) 200 MG tablet Take 800 mg by mouth at bedtime as needed for mild pain.   . Multiple Vitamins-Minerals (MULTIVITAMIN ADULT) CHEW Chew 1 each  by mouth daily.  Marland Kitchen omeprazole (PRILOSEC) 20 MG capsule Take 20 mg by mouth every other day.    No facility-administered encounter medications on file as of 05/12/2018.     Allergies  Allergen Reactions  . Clindamycin/Lincomycin Other (See Comments)    "stuck in throat" cannot take pill form; can take as liquid  . Penicillins Swelling and Rash    Childhood reaction and family history  Has patient had a PCN reaction causing immediate rash, facial/tongue/throat swelling, SOB or lightheadedness with hypotension: Unknown Has patient had a PCN reaction causing severe rash involving mucus membranes or skin necrosis: Unknown Has patient had a PCN reaction that required hospitalization: Unknown Has patient had a PCN reaction occurring within the last 10 years: No If all of the above answers are "NO", then may proceed with Cephalosporin use.     Review of Systems  Constitutional: Negative for appetite change, chills, fatigue and fever.  Respiratory: Negative for cough, chest tightness, shortness of breath and wheezing.   Cardiovascular: Negative for chest pain, palpitations and leg swelling.  Gastrointestinal: Negative for abdominal pain, constipation, diarrhea, nausea and vomiting.  Musculoskeletal: Positive for arthralgias and myalgias.  Skin: Positive for color change.       Multiple abnormal lesions   Neurological: Negative for dizziness and headaches.  Psychiatric/Behavioral: Negative for confusion.  All other systems reviewed and are negative.       Objective:  BP 122/75   Pulse (!) 59   Temp 97.6 F (36.4 C) (Oral)   Ht '5\' 10"'$  (1.778 m)   Wt 223 lb (101.2 kg)   LMP 05/01/2018 (Exact Date)   BMI 32.00 kg/m    Wt Readings from Last 3 Encounters:  05/12/18 223 lb (101.2 kg)  05/01/18 230 lb (104.3 kg)  01/27/18 230 lb (104.3 kg)    Physical Exam Vitals signs and nursing note reviewed. Exam conducted with a chaperone present.  Constitutional:      General: She is not in  acute distress.    Appearance: Normal appearance. She is well-developed and well-groomed. She is obese. She is not ill-appearing or toxic-appearing.  HENT:     Head: Normocephalic and atraumatic.     Right Ear: Tympanic membrane, ear canal and external ear normal.     Left Ear: Tympanic membrane and ear canal normal.     Mouth/Throat:     Mouth: Mucous membranes are moist.     Pharynx: Oropharynx is clear. No oropharyngeal exudate.  Eyes:  Conjunctiva/sclera: Conjunctivae normal.     Pupils: Pupils are equal, round, and reactive to light.  Neck:     Musculoskeletal: Normal range of motion and neck supple.  Cardiovascular:     Rate and Rhythm: Normal rate and regular rhythm.     Heart sounds: Normal heart sounds.  Pulmonary:     Effort: Pulmonary effort is normal.     Breath sounds: Normal breath sounds.  Chest:     Breasts:        Right: Normal.        Left: Normal.  Abdominal:     General: Abdomen is protuberant. Bowel sounds are normal.     Palpations: Abdomen is soft.     Tenderness: There is no abdominal tenderness. There is no right CVA tenderness, left CVA tenderness, guarding or rebound. Negative signs include Murphy's sign and McBurney's sign.     Hernia: No hernia is present. There is no hernia in the right inguinal area or left inguinal area.  Genitourinary:    General: Normal vulva.     Exam position: Lithotomy position.     Pubic Area: No rash or pubic lice.      Labia:        Right: No rash, tenderness, lesion or injury.        Left: No rash, tenderness, lesion or injury.      Urethra: No prolapse, urethral pain, urethral swelling or urethral lesion.     Vagina: Normal.     Cervix: Normal.     Uterus: Normal.      Adnexa: Right adnexa normal and left adnexa normal.     Rectum: Normal.  Lymphadenopathy:     Upper Body:     Right upper body: No supraclavicular, axillary or pectoral adenopathy.     Left upper body: No supraclavicular, axillary or pectoral  adenopathy.     Lower Body: No right inguinal adenopathy. No left inguinal adenopathy.  Skin:    General: Skin is warm and dry.     Capillary Refill: Capillary refill takes less than 2 seconds.       Neurological:     General: No focal deficit present.     Mental Status: She is alert and oriented to person, place, and time.  Psychiatric:        Mood and Affect: Mood normal.        Behavior: Behavior normal. Behavior is cooperative.        Thought Content: Thought content normal.        Judgment: Judgment normal.     Results for orders placed or performed in visit on 05/01/18  CBC with Differential/Platelet  Result Value Ref Range   WBC 8.4 3.4 - 10.8 x10E3/uL   RBC 4.38 3.77 - 5.28 x10E6/uL   Hemoglobin 11.6 11.1 - 15.9 g/dL   Hematocrit 36.7 34.0 - 46.6 %   MCV 84 79 - 97 fL   MCH 26.5 (L) 26.6 - 33.0 pg   MCHC 31.6 31.5 - 35.7 g/dL   RDW 13.9 11.7 - 15.4 %   Platelets 301 150 - 450 x10E3/uL   Neutrophils 61 Not Estab. %   Lymphs 27 Not Estab. %   Monocytes 8 Not Estab. %   Eos 3 Not Estab. %   Basos 1 Not Estab. %   Neutrophils Absolute 5.1 1.4 - 7.0 x10E3/uL   Lymphocytes Absolute 2.3 0.7 - 3.1 x10E3/uL   Monocytes Absolute 0.7 0.1 - 0.9 x10E3/uL   EOS (  ABSOLUTE) 0.2 0.0 - 0.4 x10E3/uL   Basophils Absolute 0.1 0.0 - 0.2 x10E3/uL   Immature Granulocytes 0 Not Estab. %   Immature Grans (Abs) 0.0 0.0 - 0.1 x10E3/uL  CMP14+EGFR  Result Value Ref Range   Glucose 88 65 - 99 mg/dL   BUN 9 6 - 24 mg/dL   Creatinine, Ser 0.73 0.57 - 1.00 mg/dL   GFR calc non Af Amer 101 >59 mL/min/1.73   GFR calc Af Amer 117 >59 mL/min/1.73   BUN/Creatinine Ratio 12 9 - 23   Sodium 142 134 - 144 mmol/L   Potassium 4.4 3.5 - 5.2 mmol/L   Chloride 105 96 - 106 mmol/L   CO2 22 20 - 29 mmol/L   Calcium 9.0 8.7 - 10.2 mg/dL   Total Protein 6.6 6.0 - 8.5 g/dL   Albumin 4.0 3.8 - 4.8 g/dL   Globulin, Total 2.6 1.5 - 4.5 g/dL   Albumin/Globulin Ratio 1.5 1.2 - 2.2   Bilirubin Total 0.3  0.0 - 1.2 mg/dL   Alkaline Phosphatase 61 39 - 117 IU/L   AST 16 0 - 40 IU/L   ALT 10 0 - 32 IU/L  HIV Antibody (routine testing w rflx)  Result Value Ref Range   HIV Screen 4th Generation wRfx Non Reactive Non Reactive  Lipid panel  Result Value Ref Range   Cholesterol, Total 262 (H) 100 - 199 mg/dL   Triglycerides 106 0 - 149 mg/dL   HDL 36 (L) >39 mg/dL   VLDL Cholesterol Cal 21 5 - 40 mg/dL   LDL Calculated 205 (H) 0 - 99 mg/dL   Chol/HDL Ratio 7.3 (H) 0.0 - 4.4 ratio  TSH  Result Value Ref Range   TSH 2.340 0.450 - 4.500 uIU/mL   Pts name, DOB, allergies, and procedure verified for following procedures:    Skin lesion removals: Left axilla lesion: Elliptical incision was made following along skin lines giving margins of 2 mm on either side of the lesion. 2% lidocaine with epinephrine was used for local anesthesia, 1 mL. Incision was approximated well and topical antibiotic was used and then it was covered by 4 x 4 and tape told in place. Procedure was tolerated well.  Right lateral neck lesions (3 total): Elliptical incision was made following along skin lines giving margins of 2 mm on either side of the lesion. 2% lidocaine with epinephrine was used for local anesthesia, 1 mL per lesion. Incision was approximated well and topical antibiotic was used and then it was covered by 4 x 4 and tape told in place. Procedure was tolerated well   Pertinent labs & imaging results that were available during my care of the patient were reviewed by me and considered in my medical decision making.  Assessment & Plan:  Lou was seen today for gynecologic exam and mole removal.  Diagnoses and all orders for this visit:  Atypical mole Wound care discussed. Report any signs or symptoms of infection.  -     Pathology -     Pathology  Pap smear for cervical cancer screening -     IGP, Aptima HPV, rfx 16/18,45     Continue all other maintenance medications.  Follow up plan: Return in  about 3 months (around 08/10/2018), or if symptoms worsen or fail to improve.  Educational handout given for skin lesion removal   The above assessment and management plan was discussed with the patient. The patient verbalized understanding of and has agreed to the management  plan. Patient is aware to call the clinic if symptoms persist or worsen. Patient is aware when to return to the clinic for a follow-up visit. Patient educated on when it is appropriate to go to the emergency department.   Monia Pouch, FNP-C [facility] [phone]

## 2018-05-13 ENCOUNTER — Telehealth: Payer: Self-pay | Admitting: Family Medicine

## 2018-05-13 DIAGNOSIS — Z1231 Encounter for screening mammogram for malignant neoplasm of breast: Secondary | ICD-10-CM | POA: Diagnosis not present

## 2018-05-13 NOTE — Telephone Encounter (Signed)
Confirmed with Ulice Dash at [facility] that biopsy was from the left axilla

## 2018-05-14 LAB — PATHOLOGY

## 2018-05-19 LAB — IGP, APTIMA HPV, RFX 16/18,45: HPV Aptima: NEGATIVE

## 2018-08-01 ENCOUNTER — Ambulatory Visit (INDEPENDENT_AMBULATORY_CARE_PROVIDER_SITE_OTHER): Payer: BLUE CROSS/BLUE SHIELD | Admitting: Nurse Practitioner

## 2018-08-01 ENCOUNTER — Encounter: Payer: Self-pay | Admitting: Nurse Practitioner

## 2018-08-01 ENCOUNTER — Other Ambulatory Visit: Payer: Self-pay

## 2018-08-01 DIAGNOSIS — B373 Candidiasis of vulva and vagina: Secondary | ICD-10-CM

## 2018-08-01 DIAGNOSIS — B3731 Acute candidiasis of vulva and vagina: Secondary | ICD-10-CM

## 2018-08-01 MED ORDER — FLUCONAZOLE 150 MG PO TABS: 150.0000 mg | ORAL_TABLET | Freq: Once | ORAL | 0 refills | Status: AC

## 2018-08-01 NOTE — Progress Notes (Signed)
    Virtual Visit via telephone Note  I connected with Emily Boone on 08/01/18 at 9:40 AM by telephone and verified that I am speaking with the correct person using two identifiers. Emily Boone is currently located at home and no one is currently with her during visit. The provider, Mary-Margaret Hassell Done, FNP is located in their office at time of visit.  I discussed the limitations, risks, security and privacy concerns of performing an evaluation and management service by telephone and the availability of in person appointments. I also discussed with the patient that there may be a patient responsible charge related to this service. The patient expressed understanding and agreed to proceed.   History and Present Illness:   Chief Complaint: Vaginitis   HPI  patient calls in c/o of vaginal discharge with itching and burning of perineal area. Started 2 days ago and is worsening.  Review of Systems  Genitourinary:       Vaginal discharge Perineal itching  All other systems reviewed and are negative.    Observations/Objective: Alert and oriented- answers all questions appropriately No distress noted  Assessment and Plan: Emily Boone in today with chief complaint of Vaginitis   1. Vaginal candidiasis No bubble baths Avoid scratching Monistat OTC for itching - fluconazole (DIFLUCAN) 150 MG tablet; Take 1 tablet (150 mg total) by mouth once for 1 dose.  Dispense: 1 tablet; Refill: 0   Follow Up Instructions: prn    I discussed the assessment and treatment plan with the patient. The patient was provided an opportunity to ask questions and all were answered. The patient agreed with the plan and demonstrated an understanding of the instructions.   The patient was advised to call back or seek an in-person evaluation if the symptoms worsen or if the condition fails to improve as anticipated.  The above assessment and management plan was discussed with the patient. The  patient verbalized understanding of and has agreed to the management plan. Patient is aware to call the clinic if symptoms persist or worsen. Patient is aware when to return to the clinic for a follow-up visit. Patient educated on when it is appropriate to go to the emergency department.   Time call ended:  9:50  I provided 10 minutes of non-face-to-face time during this encounter.    Mary-Margaret Hassell Done, FNP

## 2018-08-15 ENCOUNTER — Other Ambulatory Visit: Payer: Self-pay

## 2018-08-15 ENCOUNTER — Ambulatory Visit (INDEPENDENT_AMBULATORY_CARE_PROVIDER_SITE_OTHER): Payer: Self-pay | Admitting: Family Medicine

## 2018-08-15 ENCOUNTER — Encounter: Payer: Self-pay | Admitting: Family Medicine

## 2018-08-15 ENCOUNTER — Telehealth: Payer: Self-pay | Admitting: Family Medicine

## 2018-08-15 DIAGNOSIS — R252 Cramp and spasm: Secondary | ICD-10-CM

## 2018-08-15 DIAGNOSIS — M797 Fibromyalgia: Secondary | ICD-10-CM

## 2018-08-15 MED ORDER — PREGABALIN ER 82.5 MG PO TB24: 1.0000 | ORAL_TABLET | Freq: Once | ORAL | 0 refills | Status: AC

## 2018-08-15 NOTE — Progress Notes (Signed)
Virtual Visit via telephone Note Due to COVID-19, visit is conducted virtually and was requested by patient. This visit type was conducted due to national recommendations for restrictions regarding the COVID-19 Pandemic (e.g. social distancing) in an effort to limit this patient's exposure and mitigate transmission in our community. All issues noted in this document were discussed and addressed.  A physical exam was not performed with this format.   I connected with Emily Boone on 08/15/18 at 1135 by telephone and verified that I am speaking with the correct person using two identifiers. Emily Boone is currently located at home and family is currently with them during visit. The provider, Monia Pouch, FNP is located in their office at time of visit.  I discussed the limitations, risks, security and privacy concerns of performing an evaluation and management service by telephone and the availability of in person appointments. I also discussed with the patient that there may be a patient responsible charge related to this service. The patient expressed understanding and agreed to proceed.  Subjective:  [name], female    DOB: 06/22/1975, 43 y.o.   MRN: [id number]  Chief Complaint:  Leg Pain (bilateral)   HPI: Emily Boone is a 43 y.o. female presenting on 08/15/2018 for Leg Pain (bilateral)   Pt reports increasing leg pain and cramps over the last few weeks. States the pain is worse at night. States she is unable to take the gabapentin, she is not able to swallow capsules. States she has stopped this for several weeks. States the pain starts at her hips and radiates down her legs. States the pain is cramping and sharp at times. She is currently taking atorvastatin as prescribed. No other associated symptoms.   Leg Pain   The incident occurred more than 1 week ago. There was no injury mechanism. The pain is present in the left leg and right leg. The pain is at a  severity of 5/10. The pain is moderate. The pain has been fluctuating since onset. Pertinent negatives include no inability to bear weight, loss of motion, loss of sensation, muscle weakness, numbness or tingling. The symptoms are aggravated by movement and weight bearing. She has tried acetaminophen for the symptoms. The treatment provided mild relief.     Relevant past medical, surgical, family, and social history reviewed and updated as indicated.  Allergies and medications reviewed and updated.   Past Medical History:  Diagnosis Date  . Anxiety   . Depression   . GERD (gastroesophageal reflux disease)   . History of kidney stones   . PTSD (post-traumatic stress disorder)     Past Surgical History:  Procedure Laterality Date  . CHOLECYSTECTOMY    . EPIGASTRIC HERNIA REPAIR N/A 12/14/2016   Procedure: OPEN REPAIR OF EPIGASTRIC HERNIA ;  Surgeon: Clovis Riley, MD;  Location: [facility];  Service: General;  Laterality: N/A;  . HERNIA REPAIR    . INSERTION OF MESH N/A 12/14/2016   Procedure: INSERTION OF MESH;  Surgeon: Clovis Riley, MD;  Location: [facility];  Service: General;  Laterality: N/A;  . KNEE ARTHROSCOPY Right   . TUBAL LIGATION    . UMBILICAL HERNIA REPAIR N/A 12/14/2016   Procedure: OPEN REPAIR OF UMBILICAL HERNIA;  Surgeon: Clovis Riley, MD;  Location: [facility];  Service: General;  Laterality: N/A;    Social History   Socioeconomic History  . Marital status: Married    Spouse name: Not on file  . Number of children: Not  on file  . Years of education: Not on file  . Highest education level: Not on file  Occupational History  . Not on file  Social Needs  . Financial resource strain: Not on file  . Food insecurity:    Worry: Not on file    Inability: Not on file  . Transportation needs:    Medical: Not on file    Non-medical: Not on file  Tobacco Use  . Smoking status: Former Smoker    Packs/day: 0.10    Years: 17.00    Pack years: 1.70    Types:  Cigarettes    Last attempt to quit: 09/01/2015    Years since quitting: 2.9  . Smokeless tobacco: Never Used  Substance and Sexual Activity  . Alcohol use: No  . Drug use: No  . Sexual activity: Not on file  Lifestyle  . Physical activity:    Days per week: Not on file    Minutes per session: Not on file  . Stress: Not on file  Relationships  . Social connections:    Talks on phone: Not on file    Gets together: Not on file    Attends religious service: Not on file    Active member of club or organization: Not on file    Attends meetings of clubs or organizations: Not on file    Relationship status: Not on file  . Intimate partner violence:    Fear of current or ex partner: Not on file    Emotionally abused: Not on file    Physically abused: Not on file    Forced sexual activity: Not on file  Other Topics Concern  . Not on file  Social History Narrative  . Not on file    Outpatient Encounter Medications as of 08/15/2018  Medication Sig  . atorvastatin (LIPITOR) 20 MG tablet Take 1 tablet (20 mg total) by mouth daily.  . fluticasone (FLONASE) 50 MCG/ACT nasal spray Place 2 sprays into both nostrils daily.  Marland Kitchen ibuprofen (ADVIL,MOTRIN) 200 MG tablet Take 800 mg by mouth at bedtime as needed for mild pain.   . Multiple Vitamins-Minerals (MULTIVITAMIN ADULT) CHEW Chew 1 each by mouth daily.  Marland Kitchen omeprazole (PRILOSEC) 20 MG capsule Take 20 mg by mouth every other day.   . Pregabalin ER (LYRICA CR) 82.5 MG TB24 Take 1 tablet by mouth once for 1 dose.  . [DISCONTINUED] gabapentin (NEURONTIN) 300 MG capsule Take 1 capsule (300 mg total) by mouth 3 times/day as needed-between meals & bedtime.   No facility-administered encounter medications on file as of 08/15/2018.     Allergies  Allergen Reactions  . Clindamycin/Lincomycin Other (See Comments)    "stuck in throat" cannot take pill form; can take as liquid  . Penicillins Swelling and Rash    Childhood reaction and family history   Has patient had a PCN reaction causing immediate rash, facial/tongue/throat swelling, SOB or lightheadedness with hypotension: Unknown Has patient had a PCN reaction causing severe rash involving mucus membranes or skin necrosis: Unknown Has patient had a PCN reaction that required hospitalization: Unknown Has patient had a PCN reaction occurring within the last 10 years: No If all of the above answers are "NO", then may proceed with Cephalosporin use.     Review of Systems  Constitutional: Negative for chills, fatigue and fever.  Respiratory: Negative for cough, chest tightness, shortness of breath and wheezing.   Cardiovascular: Negative for chest pain, palpitations and leg swelling.  Musculoskeletal: Positive  for myalgias. Negative for arthralgias, back pain, gait problem, joint swelling, neck pain and neck stiffness.  Skin: Negative for color change.  Neurological: Negative for dizziness, tingling, facial asymmetry, weakness, numbness and headaches.  Psychiatric/Behavioral: Negative for confusion.  All other systems reviewed and are negative.        Observations/Objective: No vital signs or physical exam, this was a telephone or virtual health encounter.  Pt alert and oriented, answers all questions appropriately, and able to speak in full sentences.    Assessment and Plan: Emily Boone was seen today for leg pain.  Diagnoses and all orders for this visit:  Fibromyalgia Stop gabapentin. Start once daily Lyrica. Follow up in office in 4 weeks.  -     Pregabalin ER (LYRICA CR) 82.5 MG TB24; Take 1 tablet by mouth once for 1 dose.  Leg cramps Take atorvastatin every other night to see if this eases the cramping. Stop gabapentin and start once daily Lyrica -     Pregabalin ER (LYRICA CR) 82.5 MG TB24; Take 1 tablet by mouth once for 1 dose.     Follow Up Instructions: Return in about 4 weeks (around 09/12/2018), or if symptoms worsen or fail to improve, for myalgias.    I  discussed the assessment and treatment plan with the patient. The patient was provided an opportunity to ask questions and all were answered. The patient agreed with the plan and demonstrated an understanding of the instructions.   The patient was advised to call back or seek an in-person evaluation if the symptoms worsen or if the condition fails to improve as anticipated.  The above assessment and management plan was discussed with the patient. The patient verbalized understanding of and has agreed to the management plan. Patient is aware to call the clinic if symptoms persist or worsen. Patient is aware when to return to the clinic for a follow-up visit. Patient educated on when it is appropriate to go to the emergency department.    I provided 15 minutes of non-face-to-face time during this encounter. The call started at 1135. The call ended at 1150. The other time was used for coordination of care.    Monia Pouch, FNP-C [facility] [address] [place], [place] [address] [phone]

## 2018-08-15 NOTE — Telephone Encounter (Signed)
Spoke with pharmacy and clarified the Lyrica sig is take 1PO daily not 1 time dose.

## 2018-09-12 ENCOUNTER — Other Ambulatory Visit: Payer: Self-pay

## 2018-09-15 ENCOUNTER — Encounter: Payer: Self-pay | Admitting: Family Medicine

## 2018-09-15 ENCOUNTER — Other Ambulatory Visit: Payer: Self-pay

## 2018-09-15 ENCOUNTER — Ambulatory Visit (INDEPENDENT_AMBULATORY_CARE_PROVIDER_SITE_OTHER): Payer: Self-pay | Admitting: Family Medicine

## 2018-09-15 VITALS — BP 115/80 | HR 67 | Temp 97.7°F | Ht 70.0 in | Wt 218.0 lb

## 2018-09-15 DIAGNOSIS — M791 Myalgia, unspecified site: Secondary | ICD-10-CM

## 2018-09-15 DIAGNOSIS — M797 Fibromyalgia: Secondary | ICD-10-CM

## 2018-09-15 DIAGNOSIS — M130 Polyarthritis, unspecified: Secondary | ICD-10-CM

## 2018-09-15 NOTE — Progress Notes (Signed)
Subjective:  [name], female    DOB: 1975/06/27, 43 y.o.   MRN: [id number]  Chief Complaint:  Muscle aches   HPI: [name] is a 43 y.o. female presenting on 09/15/2018 for Muscle aches  Pt presents today for follow up of fibromyalgia and muscle aches. Pt states she did not start the Lyrica due to cost of medication. Pt is not taking gabapentin. She has tried Cymbalta in the past without good results. Pt states she has cut her atorvastatin to every other day and has not noticed a change in her symptoms. Pt states she is now have swelling in her fingers with pain. No fever, chills, or weakness. She does have fatigue due to the ongoing pain.   Relevant past medical, surgical, family, and social history reviewed and updated as indicated.  Allergies and medications reviewed and updated.   Past Medical History:  Diagnosis Date  . Anxiety   . Depression   . GERD (gastroesophageal reflux disease)   . History of kidney stones   . PTSD (post-traumatic stress disorder)     Past Surgical History:  Procedure Laterality Date  . CHOLECYSTECTOMY    . EPIGASTRIC HERNIA REPAIR N/A 12/14/2016   Procedure: OPEN REPAIR OF EPIGASTRIC HERNIA ;  Surgeon: Clovis Riley, MD;  Location: [facility];  Service: General;  Laterality: N/A;  . HERNIA REPAIR    . INSERTION OF MESH N/A 12/14/2016   Procedure: INSERTION OF MESH;  Surgeon: Clovis Riley, MD;  Location: [facility];  Service: General;  Laterality: N/A;  . KNEE ARTHROSCOPY Right   . TUBAL LIGATION    . UMBILICAL HERNIA REPAIR N/A 12/14/2016   Procedure: OPEN REPAIR OF UMBILICAL HERNIA;  Surgeon: Clovis Riley, MD;  Location: [facility];  Service: General;  Laterality: N/A;    Social History   Socioeconomic History  . Marital status: Married    Spouse name: Not on file  . Number of children: Not on file  . Years of education: Not on file  . Highest education level: Not on file  Occupational History  . Not on file   Social Needs  . Financial resource strain: Not on file  . Food insecurity    Worry: Not on file    Inability: Not on file  . Transportation needs    Medical: Not on file    Non-medical: Not on file  Tobacco Use  . Smoking status: Former Smoker    Packs/day: 0.10    Years: 17.00    Pack years: 1.70    Types: Cigarettes    Quit date: 09/01/2015    Years since quitting: 3.0  . Smokeless tobacco: Never Used  Substance and Sexual Activity  . Alcohol use: No  . Drug use: No  . Sexual activity: Not on file  Lifestyle  . Physical activity    Days per week: Not on file    Minutes per session: Not on file  . Stress: Not on file  Relationships  . Social Herbalist on phone: Not on file    Gets together: Not on file    Attends religious service: Not on file    Active member of club or organization: Not on file    Attends meetings of clubs or organizations: Not on file    Relationship status: Not on file  . Intimate partner violence    Fear of current or ex partner: Not on file  Emotionally abused: Not on file    Physically abused: Not on file    Forced sexual activity: Not on file  Other Topics Concern  . Not on file  Social History Narrative  . Not on file    Outpatient Encounter Medications as of 09/15/2018  Medication Sig  . acetaminophen (TYLENOL) 500 MG tablet Take 500 mg by mouth every 6 (six) hours as needed.  Marland Kitchen atorvastatin (LIPITOR) 20 MG tablet Take 1 tablet (20 mg total) by mouth daily.  . fluticasone (FLONASE) 50 MCG/ACT nasal spray Place 2 sprays into both nostrils daily.  . Multiple Vitamins-Minerals (MULTIVITAMIN ADULT) CHEW Chew 1 each by mouth daily.  Marland Kitchen omeprazole (PRILOSEC) 20 MG capsule Take 20 mg by mouth every other day.   . [DISCONTINUED] ibuprofen (ADVIL,MOTRIN) 200 MG tablet Take 800 mg by mouth at bedtime as needed for mild pain.    No facility-administered encounter medications on file as of 09/15/2018.     Allergies  Allergen Reactions   . Clindamycin/Lincomycin Other (See Comments)    "stuck in throat" cannot take pill form; can take as liquid  . Penicillins Swelling and Rash    Childhood reaction and family history  Has patient had a PCN reaction causing immediate rash, facial/tongue/throat swelling, SOB or lightheadedness with hypotension: Unknown Has patient had a PCN reaction causing severe rash involving mucus membranes or skin necrosis: Unknown Has patient had a PCN reaction that required hospitalization: Unknown Has patient had a PCN reaction occurring within the last 10 years: No If all of the above answers are "NO", then may proceed with Cephalosporin use.     Review of Systems  Constitutional: Positive for activity change and fatigue. Negative for chills, fever and unexpected weight change.  Eyes: Negative for photophobia and visual disturbance.  Respiratory: Negative for cough and shortness of breath.   Cardiovascular: Negative for chest pain, palpitations and leg swelling.  Gastrointestinal: Negative for abdominal pain, diarrhea, nausea and vomiting.  Genitourinary: Negative for decreased urine volume and difficulty urinating.  Musculoskeletal: Positive for arthralgias, back pain, joint swelling and myalgias. Negative for neck pain and neck stiffness.  Skin: Negative for color change and pallor.  Neurological: Negative for dizziness, syncope, weakness, light-headedness and headaches.  Psychiatric/Behavioral: Positive for dysphoric mood and sleep disturbance. Negative for agitation, behavioral problems, confusion, decreased concentration, hallucinations, self-injury and suicidal ideas. The patient is not nervous/anxious and is not hyperactive.   All other systems reviewed and are negative.       Objective:  BP 115/80   Pulse 67   Temp 97.7 F (36.5 C) (Oral)   Ht 5\' 10"  (1.778 m)   Wt 218 lb (98.9 kg)   BMI 31.28 kg/m    Wt Readings from Last 3 Encounters:  09/15/18 218 lb (98.9 kg)  05/12/18 223  lb (101.2 kg)  05/01/18 230 lb (104.3 kg)    Physical Exam Vitals signs and nursing note reviewed.  Constitutional:      General: She is not in acute distress.    Appearance: Normal appearance. She is well-developed and well-groomed. She is not ill-appearing, toxic-appearing or diaphoretic.  HENT:     Head: Normocephalic and atraumatic.     Jaw: There is normal jaw occlusion.     Right Ear: Hearing normal.     Left Ear: Hearing normal.     Nose: Nose normal.     Mouth/Throat:     Lips: Pink.     Mouth: Mucous membranes are moist.  Pharynx: Oropharynx is clear. Uvula midline.  Eyes:     General: Lids are normal.     Extraocular Movements: Extraocular movements intact.     Conjunctiva/sclera: Conjunctivae normal.     Pupils: Pupils are equal, round, and reactive to light.  Neck:     Musculoskeletal: Normal range of motion and neck supple.     Thyroid: No thyroid mass, thyromegaly or thyroid tenderness.     Vascular: No carotid bruit or JVD.     Trachea: Trachea and phonation normal.  Cardiovascular:     Rate and Rhythm: Normal rate and regular rhythm.     Chest Wall: PMI is not displaced.     Pulses: Normal pulses.     Heart sounds: Normal heart sounds. No murmur. No friction rub. No gallop.   Pulmonary:     Effort: Pulmonary effort is normal. No respiratory distress.     Breath sounds: Normal breath sounds. No wheezing.  Abdominal:     General: Bowel sounds are normal. There is no distension or abdominal bruit.     Palpations: Abdomen is soft. There is no hepatomegaly or splenomegaly.     Tenderness: There is no abdominal tenderness. There is no right CVA tenderness or left CVA tenderness.     Hernia: No hernia is present.  Musculoskeletal: Normal range of motion.     Right knee: She exhibits normal range of motion, no swelling, no effusion, no ecchymosis, no deformity, no laceration, no erythema, normal alignment, no LCL laxity, normal patellar mobility, no bony  tenderness, normal meniscus and no MCL laxity. Tenderness found.     Left knee: She exhibits normal range of motion, no swelling, no effusion, no ecchymosis, no deformity, no laceration, no erythema, normal alignment, no LCL laxity, normal patellar mobility, no bony tenderness, normal meniscus and no MCL laxity. Tenderness found.     Right hand: She exhibits tenderness (all fingers) and swelling (minimal swelling to fingers). She exhibits normal range of motion, no bony tenderness, normal two-point discrimination, normal capillary refill, no deformity and no laceration. Normal sensation noted. Normal strength noted.     Left hand: She exhibits tenderness (all fingers) and swelling (minimal swelling of fingers). She exhibits normal range of motion, no bony tenderness, normal two-point discrimination, normal capillary refill, no deformity and no laceration. Normal sensation noted. Normal strength noted.     Right upper leg: She exhibits tenderness. She exhibits no bony tenderness, no swelling, no edema, no deformity and no laceration.     Left upper leg: She exhibits tenderness. She exhibits no bony tenderness, no swelling, no edema, no deformity and no laceration.     Right lower leg: No edema.     Left lower leg: No edema.  Lymphadenopathy:     Cervical: No cervical adenopathy.  Skin:    General: Skin is warm and dry.     Capillary Refill: Capillary refill takes less than 2 seconds.     Coloration: Skin is not cyanotic, jaundiced or pale.     Findings: No rash.  Neurological:     General: No focal deficit present.     Mental Status: She is alert and oriented to person, place, and time.     Cranial Nerves: Cranial nerves are intact.     Sensory: Sensation is intact.     Motor: Motor function is intact.     Coordination: Coordination is intact.     Gait: Gait is intact.     Deep Tendon Reflexes: Reflexes  are normal and symmetric.  Psychiatric:        Attention and Perception: Attention and  perception normal.        Mood and Affect: Mood and affect normal.        Speech: Speech normal.        Behavior: Behavior normal. Behavior is cooperative.        Thought Content: Thought content normal.        Cognition and Memory: Cognition and memory normal.        Judgment: Judgment normal.     Results for orders placed or performed in visit on 05/12/18  IGP, Aptima HPV, rfx 16/18,45  Result Value Ref Range   Interpretation EPCA,LSIL (A)    Category LSIL (A)    Adequacy ENDO    Clinician Provided ICD10 Comment    Performed by: Comment    QC reviewed by: Comment    Electronically signed by: Comment    PATHOLOGIST PROVIDED ICD10: Comment    Note: Comment    Test Methodology Comment    HPV Aptima Negative Negative  Pathology  Result Value Ref Range   . Comment    . Comment    . Comment    . Comment    . Comment    . Comment    . Comment   Pathology  Result Value Ref Range   . Comment    . Comment    . Comment    . Comment    . Comment    . Comment    . Comment        Pertinent labs & imaging results that were available during my care of the patient were reviewed by me and considered in my medical decision making.  Assessment & Plan:  Keauna was seen today for muscle aches.  Diagnoses and all orders for this visit:  Fibromyalgia Polyarthritis Myalgia Stop atorvastatin and start over the counter Red Yeast Rice. Will check below labs today. May need referral to rheumatology. Continue Tylenol as needed. Report any new or worsening symptoms.  -     ANA,IFA RA Diag Pnl w/rflx Tit/Patn -     C-reactive protein -     CK     Continue all other maintenance medications.  Follow up plan: Return in about 3 months (around 12/16/2018), or if symptoms worsen or fail to improve, for MYLAGIA.  Educational handout given for muscle pain  The above assessment and management plan was discussed with the patient. The patient verbalized understanding of and has agreed to  the management plan. Patient is aware to call the clinic if symptoms persist or worsen. Patient is aware when to return to the clinic for a follow-up visit. Patient educated on when it is appropriate to go to the emergency department.   Monia Pouch, FNP-C [facility] [phone]

## 2018-09-15 NOTE — Patient Instructions (Signed)

## 2018-09-17 LAB — ANA,IFA RA DIAG PNL W/RFLX TIT/PATN
ANA Titer 1: NEGATIVE
Cyclic Citrullin Peptide Ab: 6 units (ref 0–19)
Rhuematoid fact SerPl-aCnc: <10 IU/mL (ref 0.0–13.9)

## 2018-09-17 LAB — C-REACTIVE PROTEIN: CRP: 2 mg/L (ref 0–10)

## 2018-09-17 LAB — CK: Total CK: 103 U/L (ref 32–182)

## 2018-10-09 ENCOUNTER — Encounter: Payer: Self-pay | Admitting: *Deleted

## 2018-12-15 ENCOUNTER — Telehealth: Payer: Self-pay | Admitting: Family Medicine

## 2018-12-15 NOTE — Telephone Encounter (Signed)
She reported she was not taking the gabapentin at her last appointment and had tried cymbalta in the past without success. I can send in medications for her toenails but she has to have labs first because the meds can damage liver. I was going to refer to rheumatology if ANA was positive but she does not have insurance coverage at this time. Ask her if she would like to be seen, if not, I can do topical therapy for her nails.

## 2018-12-15 NOTE — Telephone Encounter (Signed)
Aware and will keep appt  

## 2018-12-16 ENCOUNTER — Other Ambulatory Visit: Payer: Self-pay

## 2018-12-16 ENCOUNTER — Ambulatory Visit: Payer: Self-pay | Admitting: Family Medicine

## 2018-12-17 ENCOUNTER — Encounter: Payer: Self-pay | Admitting: Family Medicine

## 2018-12-17 ENCOUNTER — Ambulatory Visit (INDEPENDENT_AMBULATORY_CARE_PROVIDER_SITE_OTHER): Payer: Self-pay | Admitting: Family Medicine

## 2018-12-17 ENCOUNTER — Telehealth: Payer: Self-pay | Admitting: Family Medicine

## 2018-12-17 VITALS — BP 122/78 | HR 68 | Temp 99.1°F | Resp 20 | Ht 70.0 in | Wt 229.0 lb

## 2018-12-17 DIAGNOSIS — M130 Polyarthritis, unspecified: Secondary | ICD-10-CM

## 2018-12-17 DIAGNOSIS — E7800 Pure hypercholesterolemia, unspecified: Secondary | ICD-10-CM

## 2018-12-17 DIAGNOSIS — K219 Gastro-esophageal reflux disease without esophagitis: Secondary | ICD-10-CM

## 2018-12-17 DIAGNOSIS — M797 Fibromyalgia: Secondary | ICD-10-CM

## 2018-12-17 MED ORDER — CELECOXIB 50 MG PO CAPS: 50.0000 mg | ORAL_CAPSULE | Freq: Two times a day (BID) | ORAL | 3 refills | Status: AC

## 2018-12-17 MED ORDER — GABAPENTIN 300 MG/6ML PO SOLN: 100.0000 mg | Freq: Three times a day (TID) | ORAL | 3 refills | Status: DC

## 2018-12-17 MED ORDER — ATORVASTATIN CALCIUM 20 MG PO TABS
20.0000 mg | ORAL_TABLET | Freq: Every day | ORAL | 3 refills | Status: DC
Start: 2018-12-17 — End: 2019-07-27

## 2018-12-17 NOTE — Telephone Encounter (Signed)
Patient states that [facility] does not do gabapentin solution.   Patient asked "can I break open capsules and take them that way"

## 2018-12-18 LAB — CMP14+EGFR
ALT: 16 IU/L (ref 0–32)
AST: 18 IU/L (ref 0–40)
Albumin/Globulin Ratio: 1.5 (ref 1.2–2.2)
Albumin: 4 g/dL (ref 3.8–4.8)
Alkaline Phosphatase: 66 IU/L (ref 39–117)
BUN/Creatinine Ratio: 18 (ref 9–23)
BUN: 13 mg/dL (ref 6–24)
Bilirubin Total: <0.2 mg/dL (ref 0.0–1.2)
CO2: 25 mmol/L (ref 20–29)
Calcium: 9.1 mg/dL (ref 8.7–10.2)
Chloride: 102 mmol/L (ref 96–106)
Creatinine, Ser: 0.71 mg/dL (ref 0.57–1.00)
GFR calc Af Amer: 120 mL/min/{1.73_m2} (ref >59)
GFR calc non Af Amer: 104 mL/min/{1.73_m2} (ref >59)
Globulin, Total: 2.6 g/dL (ref 1.5–4.5)
Glucose: 79 mg/dL (ref 65–99)
Potassium: 4.5 mmol/L (ref 3.5–5.2)
Sodium: 138 mmol/L (ref 134–144)
Total Protein: 6.6 g/dL (ref 6.0–8.5)

## 2018-12-18 LAB — LIPID PANEL
Chol/HDL Ratio: 5.4 ratio — ABNORMAL HIGH (ref 0.0–4.4)
Cholesterol, Total: 217 mg/dL — ABNORMAL HIGH (ref 100–199)
HDL: 40 mg/dL (ref >39)
LDL Chol Calc (NIH): 155 mg/dL — ABNORMAL HIGH (ref 0–99)
Triglycerides: 120 mg/dL (ref 0–149)
VLDL Cholesterol Cal: 22 mg/dL (ref 5–40)

## 2018-12-18 NOTE — Progress Notes (Signed)
Subjective:  [name], female    DOB: 09/14/1975, 43 y.o.   MRN: [id number]  Patient Care Team: Baruch Gouty, FNP as PCP - General (Family Medicine)   Chief Complaint:  Medical Management of Chronic Issues (3 mo ) and Hyperlipidemia   HPI: Emily Boone is a 43 y.o. female presenting on 12/17/2018 for Medical Management of Chronic Issues (3 mo ) and Hyperlipidemia   1. Pure hypercholesterolemia  Compliant with medications - Yes Current medications - atorvastatin 20 mg Side effects from medications - No Diet - generally healthy Exercise - none  Lab Results  Component Value Date   CHOL 217 (H) 12/17/2018   HDL 40 12/17/2018   LDLCALC 205 (H) 05/01/2018   TRIG 120 12/17/2018   CHOLHDL 5.4 (H) 12/17/2018     Family and personal medical history reviewed. Smoking and ETOH history reviewed.    2. Fibromyalgia   3. Polyarthritis  Ongoing fibromyalgia and polyarthritis. ANA was negative. Rheumatology would not see pt due to negative ANA. Pt unable to get Lyrica due to cost. Pt was unable to tolerate Cymbalta. Could not swallow gabaepentin because it was a capsule. Pt felt as if it was getting stuck in her throat. Has tried several medications without relief of symptoms. Willing to trial liquid gabapentin and daily NSAID.    4. GERD without esophagitis  Compliant with medications - Yes Current medications - omeprazole  Adverse side effects - No Cough - No Sore throat - No Voice change - No Hemoptysis - No Dysphagia or dyspepsia - No Water brash - No Red Flags (weight loss, hematochezia, melena, weight loss, early satiety, fevers, odynophagia, or persistent vomiting) - No      Relevant past medical, surgical, family, and social history reviewed and updated as indicated.  Allergies and medications reviewed and updated. Date reviewed: Chart in Epic.   Past Medical History:  Diagnosis Date   Anxiety    Depression    GERD (gastroesophageal reflux  disease)    History of kidney stones    PTSD (post-traumatic stress disorder)     Past Surgical History:  Procedure Laterality Date   CHOLECYSTECTOMY     EPIGASTRIC HERNIA REPAIR N/A 12/14/2016   Procedure: OPEN REPAIR OF EPIGASTRIC HERNIA ;  Surgeon: Clovis Riley, MD;  Location: [facility];  Service: General;  Laterality: N/A;   HERNIA REPAIR     INSERTION OF MESH N/A 12/14/2016   Procedure: INSERTION OF MESH;  Surgeon: Clovis Riley, MD;  Location: [facility];  Service: General;  Laterality: N/A;   KNEE ARTHROSCOPY Right    TUBAL LIGATION     UMBILICAL HERNIA REPAIR N/A 12/14/2016   Procedure: OPEN REPAIR OF UMBILICAL HERNIA;  Surgeon: Clovis Riley, MD;  Location: [facility];  Service: General;  Laterality: N/A;    Social History   Socioeconomic History   Marital status: Married    Spouse name: Not on file   Number of children: Not on file   Years of education: Not on file   Highest education level: Not on file  Occupational History   Not on file  Social Needs   Financial resource strain: Not on file   Food insecurity    Worry: Not on file    Inability: Not on file   Transportation needs    Medical: Not on file    Non-medical: Not on file  Tobacco Use   Smoking status: Former Smoker    Packs/day:  0.10    Years: 17.00    Pack years: 1.70    Types: Cigarettes    Quit date: 09/01/2015    Years since quitting: 3.2   Smokeless tobacco: Never Used  Substance and Sexual Activity   Alcohol use: No   Drug use: No   Sexual activity: Not on file  Lifestyle   Physical activity    Days per week: Not on file    Minutes per session: Not on file   Stress: Not on file  Relationships   Social connections    Talks on phone: Not on file    Gets together: Not on file    Attends religious service: Not on file    Active member of club or organization: Not on file    Attends meetings of clubs or organizations: Not on file    Relationship status: Not on  file   Intimate partner violence    Fear of current or ex partner: Not on file    Emotionally abused: Not on file    Physically abused: Not on file    Forced sexual activity: Not on file  Other Topics Concern   Not on file  Social History Narrative   Not on file    Outpatient Encounter Medications as of 12/17/2018  Medication Sig   atorvastatin (LIPITOR) 20 MG tablet Take 1 tablet (20 mg total) by mouth daily.   fluticasone (FLONASE) 50 MCG/ACT nasal spray Place 2 sprays into both nostrils daily.   Melatonin 3 MG CAPS Take 1 capsule by mouth at bedtime.   Multiple Vitamins-Minerals (MULTIVITAMIN ADULT) CHEW Chew 1 each by mouth daily.   omeprazole (PRILOSEC) 20 MG capsule Take 20 mg by mouth 2 (two) times daily before a meal.    [DISCONTINUED] atorvastatin (LIPITOR) 20 MG tablet Take 1 tablet (20 mg total) by mouth daily.   [DISCONTINUED] ibuprofen (ADVIL) 600 MG tablet Take 600 mg by mouth at bedtime as needed.   celecoxib (CELEBREX) 50 MG capsule Take 1 capsule (50 mg total) by mouth 2 (two) times daily.   gabapentin (NEURONTIN) 300 MG/6ML solution Take 2 mLs (100 mg total) by mouth 3 (three) times daily.   [DISCONTINUED] acetaminophen (TYLENOL) 500 MG tablet Take 500 mg by mouth every 6 (six) hours as needed.   No facility-administered encounter medications on file as of 12/17/2018.     Allergies  Allergen Reactions   Clindamycin/Lincomycin Other (See Comments)    "stuck in throat" cannot take pill form; can take as liquid   Penicillins Swelling and Rash    Childhood reaction and family history  Has patient had a PCN reaction causing immediate rash, facial/tongue/throat swelling, SOB or lightheadedness with hypotension: Unknown Has patient had a PCN reaction causing severe rash involving mucus membranes or skin necrosis: Unknown Has patient had a PCN reaction that required hospitalization: Unknown Has patient had a PCN reaction occurring within the last 10 years:  No If all of the above answers are "NO", then may proceed with Cephalosporin use.     Review of Systems  Constitutional: Negative for activity change, appetite change, chills, diaphoresis, fatigue, fever and unexpected weight change.  HENT: Negative.   Eyes: Negative.  Negative for photophobia, pain, redness, itching and visual disturbance.  Respiratory: Negative for cough, chest tightness and shortness of breath.   Cardiovascular: Negative for chest pain, palpitations and leg swelling.  Gastrointestinal: Negative for abdominal distention, abdominal pain, anal bleeding, blood in stool, constipation, diarrhea, nausea, rectal pain and vomiting.  Endocrine: Negative.  Negative for cold intolerance, heat intolerance, polydipsia, polyphagia and polyuria.  Genitourinary: Negative for decreased urine volume, difficulty urinating, dysuria, flank pain, frequency and urgency.  Musculoskeletal: Positive for arthralgias, back pain, gait problem (at times due to hip pain.) and myalgias. Negative for joint swelling, neck pain and neck stiffness.  Skin: Negative.  Negative for color change, pallor, rash and wound.  Allergic/Immunologic: Negative.   Neurological: Positive for weakness (in arms and hands due to arthritic pain). Negative for dizziness, tremors, seizures, syncope, facial asymmetry, speech difficulty, light-headedness, numbness and headaches.  Hematological: Negative.   Psychiatric/Behavioral: Positive for dysphoric mood and sleep disturbance. Negative for confusion, hallucinations and suicidal ideas.  All other systems reviewed and are negative.       Objective:  BP 122/78    Pulse 68    Temp 99.1 F (37.3 C)    Resp 20    Ht _0  (1.778 m)    Wt 229 lb (103.9 kg)    LMP 12/13/2018    SpO2 98%    BMI 32.86 kg/m    Wt Readings from Last 3 Encounters:  12/17/18 229 lb (103.9 kg)  09/15/18 218 lb (98.9 kg)  05/12/18 223 lb (101.2 kg)    Physical Exam Vitals signs and nursing note  reviewed.  Constitutional:      General: She is not in acute distress.    Appearance: Normal appearance. She is well-developed and well-groomed. She is obese. She is not ill-appearing, toxic-appearing or diaphoretic.  HENT:     Head: Normocephalic and atraumatic.     Jaw: There is normal jaw occlusion.     Right Ear: Hearing normal.     Left Ear: Hearing normal.     Nose: Nose normal.     Mouth/Throat:     Lips: Pink.     Mouth: Mucous membranes are moist.     Pharynx: Oropharynx is clear. Uvula midline.  Eyes:     General: Lids are normal.     Extraocular Movements: Extraocular movements intact.     Conjunctiva/sclera: Conjunctivae normal.     Pupils: Pupils are equal, round, and reactive to light.  Neck:     Musculoskeletal: Normal range of motion and neck supple.     Thyroid: No thyroid mass, thyromegaly or thyroid tenderness.     Vascular: No carotid bruit or JVD.     Trachea: Trachea and phonation normal.  Cardiovascular:     Rate and Rhythm: Normal rate and regular rhythm.     Chest Wall: PMI is not displaced.     Pulses: Normal pulses.     Heart sounds: Normal heart sounds. No murmur. No friction rub. No gallop.   Pulmonary:     Effort: Pulmonary effort is normal. No respiratory distress.     Breath sounds: Normal breath sounds. No wheezing.  Abdominal:     General: Bowel sounds are normal. There is no distension or abdominal bruit.     Palpations: Abdomen is soft. There is no hepatomegaly or splenomegaly.     Tenderness: There is no abdominal tenderness. There is no right CVA tenderness or left CVA tenderness.     Hernia: No hernia is present.  Musculoskeletal: Normal range of motion.        General: Tenderness present. No swelling, deformity or signs of injury.     Right wrist: She exhibits tenderness. She exhibits normal range of motion, no bony tenderness, no swelling, no effusion, no crepitus, no deformity and  no laceration.     Right hip: She exhibits tenderness.  She exhibits normal range of motion, normal strength, no bony tenderness, no swelling, no crepitus, no deformity and no laceration.     Left hip: She exhibits tenderness. She exhibits normal range of motion, normal strength, no bony tenderness, no swelling, no crepitus, no deformity and no laceration.     Cervical back: Normal.     Lumbar back: She exhibits tenderness. She exhibits normal range of motion, no bony tenderness, no swelling, no edema, no deformity, no laceration, no pain, no spasm and normal pulse.     Right forearm: Normal.     Right hand: She exhibits tenderness. She exhibits normal range of motion, no bony tenderness, normal two-point discrimination, normal capillary refill, no deformity, no laceration and no swelling. Normal sensation noted. Normal strength noted.     Right lower leg: No edema.     Left lower leg: No edema.  Lymphadenopathy:     Cervical: No cervical adenopathy.  Skin:    General: Skin is warm and dry.     Capillary Refill: Capillary refill takes less than 2 seconds.     Coloration: Skin is not cyanotic, jaundiced or pale.     Findings: No rash.  Neurological:     General: No focal deficit present.     Mental Status: She is alert and oriented to person, place, and time.     Cranial Nerves: Cranial nerves are intact.     Sensory: Sensation is intact.     Motor: Motor function is intact.     Coordination: Coordination is intact.     Gait: Gait is intact.     Deep Tendon Reflexes: Reflexes are normal and symmetric.  Psychiatric:        Attention and Perception: Attention and perception normal.        Mood and Affect: Mood and affect normal.        Speech: Speech normal.        Behavior: Behavior normal. Behavior is cooperative.        Thought Content: Thought content normal.        Cognition and Memory: Cognition and memory normal.        Judgment: Judgment normal.     Results for orders placed or performed in visit on 12/17/18  CMP14+EGFR  Result  Value Ref Range   Glucose 79 65 - 99 mg/dL   BUN 13 6 - 24 mg/dL   Creatinine, Ser 0.71 0.57 - 1.00 mg/dL   GFR calc non Af Amer 104 >59 mL/min/1.73   GFR calc Af Amer 120 >59 mL/min/1.73   BUN/Creatinine Ratio 18 9 - 23   Sodium 138 134 - 144 mmol/L   Potassium 4.5 3.5 - 5.2 mmol/L   Chloride 102 96 - 106 mmol/L   CO2 25 20 - 29 mmol/L   Calcium 9.1 8.7 - 10.2 mg/dL   Total Protein 6.6 6.0 - 8.5 g/dL   Albumin 4.0 3.8 - 4.8 g/dL   Globulin, Total 2.6 1.5 - 4.5 g/dL   Albumin/Globulin Ratio 1.5 1.2 - 2.2   Bilirubin Total <0.2 0.0 - 1.2 mg/dL   Alkaline Phosphatase 66 39 - 117 IU/L   AST 18 0 - 40 IU/L   ALT 16 0 - 32 IU/L  Lipid panel  Result Value Ref Range   Cholesterol, Total 217 (H) 100 - 199 mg/dL   Triglycerides 120 0 - 149 mg/dL   HDL 40 >39  mg/dL   VLDL Cholesterol Cal 22 5 - 40 mg/dL   LDL Chol Calc (NIH) 155 (H) 0 - 99 mg/dL   Chol/HDL Ratio 5.4 (H) 0.0 - 4.4 ratio       Pertinent labs & imaging results that were available during my care of the patient were reviewed by me and considered in my medical decision making.  Assessment & Plan:  Emily Boone was seen today for medical management of chronic issues and hyperlipidemia.  Diagnoses and all orders for this visit:  Pure hypercholesterolemia Diet encouraged - increase intake of fresh fruits and vegetables, increase intake of lean proteins. Bake, broil, or grill foods. Avoid fried, greasy, and fatty foods. Avoid fast foods. Increase intake of fiber-rich whole grains. Exercise encouraged - at least 150 minutes per week and advance as tolerated.  Goal BMI < 25. Continue medications as prescribed. Follow up in 3-6 months as discussed.  -     atorvastatin (LIPITOR) 20 MG tablet; Take 1 tablet (20 mg total) by mouth daily. -     CMP14+EGFR -     Lipid panel  Fibromyalgia Polyarthritis Ongoing multiple joint and muscular pain. No new injuries. Point tenderness at multiple sites. Has tried and failed several medications.  Unable to get into rheumatology due to negative ANA. Offered referral to neurology, declined today. Wants to trial daily NSAID and liquid gabapentin. Will trial below. Report any new or worsening symptoms. Follow up in 3 months or sooner if needed.  -     gabapentin (NEURONTIN) 300 MG/6ML solution; Take 2 mLs (100 mg total) by mouth 3 (three) times daily. -     celecoxib (CELEBREX) 50 MG capsule; Take 1 capsule (50 mg total) by mouth 2 (two) times daily.  GERD without esophagitis Diet discussed. Avoid fried, spicy, fatty, and acidic foods. Avoid caffeine, nicotine, and alcohol. Do not eat 2-3 hours before bedtime and stay upright for at least 1-2 hours after eating. Eat small frequent meals. Avoid NSAID's like motrin and aleve. Medications as prescribed. Report any new or worsening symptoms. Follow up as discussed or sooner if needed.      Continue all other maintenance medications.  Follow up plan: Return in about 6 months (around 06/16/2019), or if symptoms worsen or fail to improve, for lipids, fibro.  Continue healthy lifestyle choices, including diet (rich in fruits, vegetables, and lean proteins, and low in salt and simple carbohydrates) and exercise (at least 30 minutes of moderate physical activity daily).  The above assessment and management plan was discussed with the patient. The patient verbalized understanding of and has agreed to the management plan. Patient is aware to call the clinic if they develop any new symptoms or if symptoms persist or worsen. Patient is aware when to return to the clinic for a follow-up visit. Patient educated on when it is appropriate to go to the emergency department.   Monia Pouch, FNP-C [facility] [phone]

## 2019-01-06 ENCOUNTER — Encounter: Payer: Self-pay | Admitting: Family Medicine

## 2019-01-06 ENCOUNTER — Ambulatory Visit (INDEPENDENT_AMBULATORY_CARE_PROVIDER_SITE_OTHER): Payer: Self-pay | Admitting: Family Medicine

## 2019-01-06 DIAGNOSIS — Z8619 Personal history of other infectious and parasitic diseases: Secondary | ICD-10-CM

## 2019-01-06 DIAGNOSIS — H6690 Otitis media, unspecified, unspecified ear: Secondary | ICD-10-CM

## 2019-01-06 MED ORDER — CEFDINIR 250 MG/5ML PO SUSR: 300.0000 mg | Freq: Two times a day (BID) | ORAL | 0 refills | Status: AC

## 2019-01-06 MED ORDER — FLUCONAZOLE 150 MG PO TABS: 150.0000 mg | ORAL_TABLET | Freq: Once | ORAL | 0 refills | Status: AC

## 2019-01-06 NOTE — Patient Instructions (Signed)
Otitis Media, Adult  Otitis media means that the middle ear is red and swollen (inflamed) and full of fluid. The condition usually goes away on its own. Follow these instructions at home:  Take over-the-counter and prescription medicines only as told by your doctor.  If you were prescribed an antibiotic medicine, take it as told by your doctor. Do not stop taking the antibiotic even if you start to feel better.  Keep all follow-up visits as told by your doctor. This is important. Contact a doctor if:  You have bleeding from your nose.  There is a lump on your neck.  You are not getting better in 5 days.  You feel worse instead of better. Get help right away if:  You have pain that is not helped with medicine.  You have swelling, redness, or pain around your ear.  You get a stiff neck.  You cannot move part of your face (paralyzed).  You notice that the bone behind your ear hurts when you touch it.  You get a very bad headache. Summary  Otitis media means that the middle ear is red, swollen, and full of fluid.  This condition usually goes away on its own. In some cases, treatment may be needed.  If you were prescribed an antibiotic medicine, take it as told by your doctor. This information is not intended to replace advice given to you by your health care provider. Make sure you discuss any questions you have with your health care provider. Document Released: 09/05/2007 Document Revised: 03/01/2017 Document Reviewed: 04/09/2016 Elsevier Patient Education  2020 [employer]  

## 2019-01-06 NOTE — Progress Notes (Signed)
Virtual Visit via Telephone Note  I connected with Emily Boone on 01/06/19 at 1:05 PM by telephone and verified that I am speaking with the correct person using two identifiers. Emily Boone is currently located at home and nobody is currently with her during this visit. The provider, Loman Brooklyn, FNP is located in their home at time of visit.  I discussed the limitations, risks, security and privacy concerns of performing an evaluation and management service by telephone and the availability of in person appointments. I also discussed with the patient that there may be a patient responsible charge related to this service. The patient expressed understanding and agreed to proceed.  Subjective: PCP: Baruch Gouty, FNP  Chief Complaint  Patient presents with  . Ear Fullness   Patient c/o fluid in the right ear. States when she swallows or bends over it sounds like rice crispies in her ear. Symptoms began 1 week ago and are gradually worsening since that time.She is also starting to have pain in her neck below her ear. Ear history: previous ear infections. She reports she was outside on Sunday all day and forgot to use her Flonase which resulted in a raw nose yesterday. She has tried instilling alcohol in the ear to dry it up several times but this has not been effective.    ROS: Per HPI  Current Outpatient Medications:  .  atorvastatin (LIPITOR) 20 MG tablet, Take 1 tablet (20 mg total) by mouth daily., Disp: 90 tablet, Rfl: 3 .  cefdinir (OMNICEF) 250 MG/5ML suspension, Take 6 mLs (300 mg total) by mouth 2 (two) times daily for 7 days., Disp: 100 mL, Rfl: 0 .  celecoxib (CELEBREX) 50 MG capsule, Take 1 capsule (50 mg total) by mouth 2 (two) times daily., Disp: 60 capsule, Rfl: 3 .  fluconazole (DIFLUCAN) 150 MG tablet, Take 1 tablet (150 mg total) by mouth once for 1 dose., Disp: 1 tablet, Rfl: 0 .  fluticasone (FLONASE) 50 MCG/ACT nasal spray, Place 2 sprays into both nostrils  daily., Disp: 16 g, Rfl: 6 .  gabapentin (NEURONTIN) 300 MG/6ML solution, Take 2 mLs (100 mg total) by mouth 3 (three) times daily., Disp: 180 mL, Rfl: 3 .  Melatonin 3 MG CAPS, Take 1 capsule by mouth at bedtime., Disp: , Rfl:  .  Multiple Vitamins-Minerals (MULTIVITAMIN ADULT) CHEW, Chew 1 each by mouth daily., Disp: , Rfl:  .  omeprazole (PRILOSEC) 20 MG capsule, Take 20 mg by mouth 2 (two) times daily before a meal. , Disp: , Rfl:   Allergies  Allergen Reactions  . Clindamycin/Lincomycin Other (See Comments)    "stuck in throat" cannot take pill form; can take as liquid  . Penicillins Swelling and Rash    Childhood reaction and family history  Has patient had a PCN reaction causing immediate rash, facial/tongue/throat swelling, SOB or lightheadedness with hypotension: Unknown Has patient had a PCN reaction causing severe rash involving mucus membranes or skin necrosis: Unknown Has patient had a PCN reaction that required hospitalization: Unknown Has patient had a PCN reaction occurring within the last 10 years: No If all of the above answers are "NO", then may proceed with Cephalosporin use.    Past Medical History:  Diagnosis Date  . Anxiety   . Depression   . GERD (gastroesophageal reflux disease)   . History of kidney stones   . PTSD (post-traumatic stress disorder)     Observations/Objective: A&O  No respiratory distress or wheezing audible over the  phone Mood, judgement, and thought processes all WNL  Assessment and Plan: 1. Acute otitis media, unspecified otitis media type - Education provided on otitis media. Patient reports similar symptoms at the beginning of the year when she saw Mrs. Rakes; upon chart review she did also have otitis media at that time.  - cefdinir (OMNICEF) 250 MG/5ML suspension; Take 6 mLs (300 mg total) by mouth 2 (two) times daily for 7 days.  Dispense: 100 mL; Refill: 0  2. History of candidal vulvovaginitis - fluconazole (DIFLUCAN) 150 MG  tablet; Take 1 tablet (150 mg total) by mouth once for 1 dose.  Dispense: 1 tablet; Refill: 0   Follow Up Instructions:  I discussed the assessment and treatment plan with the patient. The patient was provided an opportunity to ask questions and all were answered. The patient agreed with the plan and demonstrated an understanding of the instructions.   The patient was advised to call back or seek an in-person evaluation if the symptoms worsen or if the condition fails to improve as anticipated.  The above assessment and management plan was discussed with the patient. The patient verbalized understanding of and has agreed to the management plan. Patient is aware to call the clinic if symptoms persist or worsen. Patient is aware when to return to the clinic for a follow-up visit. Patient educated on when it is appropriate to go to the emergency department.   Time call ended: 1:14 PM  I provided 10 minutes of non-face-to-face time during this encounter.  Hendricks Limes, MSN, APRN, FNP-C [facility] 01/06/19

## 2019-06-15 ENCOUNTER — Ambulatory Visit: Payer: Self-pay | Admitting: Family Medicine

## 2019-06-18 ENCOUNTER — Ambulatory Visit (INDEPENDENT_AMBULATORY_CARE_PROVIDER_SITE_OTHER): Payer: Self-pay | Admitting: Family Medicine

## 2019-06-18 ENCOUNTER — Other Ambulatory Visit: Payer: Self-pay

## 2019-06-18 ENCOUNTER — Encounter: Payer: Self-pay | Admitting: Family Medicine

## 2019-06-18 VITALS — BP 114/75 | HR 84 | Temp 97.8°F | Resp 20 | Ht 70.0 in | Wt 233.0 lb

## 2019-06-18 DIAGNOSIS — R350 Frequency of micturition: Secondary | ICD-10-CM

## 2019-06-18 DIAGNOSIS — Z0000 Encounter for general adult medical examination without abnormal findings: Secondary | ICD-10-CM

## 2019-06-18 DIAGNOSIS — N898 Other specified noninflammatory disorders of vagina: Secondary | ICD-10-CM

## 2019-06-18 DIAGNOSIS — R5383 Other fatigue: Secondary | ICD-10-CM

## 2019-06-18 DIAGNOSIS — M2550 Pain in unspecified joint: Secondary | ICD-10-CM

## 2019-06-18 DIAGNOSIS — L680 Hirsutism: Secondary | ICD-10-CM

## 2019-06-18 DIAGNOSIS — E7800 Pure hypercholesterolemia, unspecified: Secondary | ICD-10-CM

## 2019-06-18 DIAGNOSIS — Z6833 Body mass index (BMI) 33.0-33.9, adult: Secondary | ICD-10-CM

## 2019-06-18 DIAGNOSIS — R87612 Low grade squamous intraepithelial lesion on cytologic smear of cervix (LGSIL): Secondary | ICD-10-CM

## 2019-06-18 LAB — URINALYSIS, COMPLETE
Bilirubin, UA: NEGATIVE
Glucose, UA: NEGATIVE
Leukocytes,UA: NEGATIVE
Nitrite, UA: NEGATIVE
Specific Gravity, UA: 1.025 (ref 1.005–1.030)
Urobilinogen, Ur: 0.2 mg/dL (ref 0.2–1.0)
pH, UA: 5.5 (ref 5.0–7.5)

## 2019-06-18 LAB — MICROSCOPIC EXAMINATION: Renal Epithel, UA: NONE SEEN /hpf

## 2019-06-18 LAB — WET PREP FOR TRICH, YEAST, CLUE
Clue Cell Exam: NEGATIVE
Trichomonas Exam: NEGATIVE
Yeast Exam: NEGATIVE

## 2019-06-18 MED ORDER — EFLORNITHINE HCL 13.9 % EX CREA: 1.0000 "application " | TOPICAL_CREAM | Freq: Two times a day (BID) | CUTANEOUS | 5 refills | Status: AC

## 2019-06-18 NOTE — Progress Notes (Addendum)
Subjective:  [name], female    DOB: [birth year], 44 y.o.   MRN: [id number]  Patient Care Team: Sharion Balloon, FNP as PCP - General (Family Medicine)   Chief Complaint:  Gynecologic Exam and Annual Exam   HPI: Emily Boone is a 44 y.o. female presenting on 06/18/2019 for Gynecologic Exam and Annual Exam   Patient presents for 6 month follow up Pap smear.  Gynecologic Exam The patient's pertinent negatives include no pelvic pain or vaginal discharge. This is a recurrent problem. The current episode started more than 1 month ago. The problem occurs intermittently. The problem has been waxing and waning. The patient is experiencing no pain. She is not pregnant. Associated symptoms include back pain and frequency. Pertinent negatives include no abdominal pain, chills, constipation, diarrhea, dysuria, fever, flank pain, hematuria, nausea, urgency or vomiting. The vaginal discharge was milky. She has not been passing clots. She has not been passing tissue. She has tried nothing for the symptoms. She is sexually active.   1. Annual physical exam   2. Low grade squamous intraepithelial lesion on cytologic smear of cervix (LGSIL) Repeat Pap today  3. BMI 33.0-33.9,adult Patient has noticed a 15 pound weight gain.  4. Fatigue, unspecified type Still ongoing. Has been trying to eat healthier and stay active but exercise seems to exacerbate her symptoms.   5. Multiple joint pain Ongoing fibromyalgia. ANA was negative. Pt reports ongoing and worsening symptoms. Unable to see rheumatology due to negative ANA. Pt has been taking liquid gabapentin but not often as this makes her very sleepy.   6. Urinary frequency Urinary frequency that seems to be worse at night. No other urinary symptoms.   7. Pure hypercholesterolemia On statin therapy. Does try to watch diet and stay active.       Relevant past medical, surgical, family, and social history reviewed and updated  as indicated.  Allergies and medications reviewed and updated. Date reviewed: Chart in Epic.   Past Medical History:  Diagnosis Date  . Anxiety   . Depression   . GERD (gastroesophageal reflux disease)   . History of kidney stones   . PTSD (post-traumatic stress disorder)     Past Surgical History:  Procedure Laterality Date  . CHOLECYSTECTOMY    . EPIGASTRIC HERNIA REPAIR N/A 12/14/2016   Procedure: OPEN REPAIR OF EPIGASTRIC HERNIA ;  Surgeon: Clovis Riley, MD;  Location: [facility];  Service: General;  Laterality: N/A;  . HERNIA REPAIR    . INSERTION OF MESH N/A 12/14/2016   Procedure: INSERTION OF MESH;  Surgeon: Clovis Riley, MD;  Location: [facility];  Service: General;  Laterality: N/A;  . KNEE ARTHROSCOPY Right   . TUBAL LIGATION    . UMBILICAL HERNIA REPAIR N/A 12/14/2016   Procedure: OPEN REPAIR OF UMBILICAL HERNIA;  Surgeon: Clovis Riley, MD;  Location: [facility];  Service: General;  Laterality: N/A;    Social History   Socioeconomic History  . Marital status: Married    Spouse name: Not on file  . Number of children: Not on file  . Years of education: Not on file  . Highest education level: Not on file  Occupational History  . Not on file  Tobacco Use  . Smoking status: Former Smoker    Packs/day: 0.10    Years: 17.00    Pack years: 1.70    Types: Cigarettes    Quit date: 09/01/2015    Years since quitting: 3.7  .  Smokeless tobacco: Never Used  Substance and Sexual Activity  . Alcohol use: No  . Drug use: No  . Sexual activity: Not on file  Other Topics Concern  . Not on file  Social History Narrative  . Not on file   Social Determinants of Health   Financial Resource Strain:   . Difficulty of Paying Living Expenses:   Food Insecurity:   . Worried About Charity fundraiser in the Last Year:   . Arboriculturist in the Last Year:   Transportation Needs:   . Film/video editor (Medical):   Marland Kitchen Lack of Transportation (Non-Medical):   Physical  Activity:   . Days of Exercise per Week:   . Minutes of Exercise per Session:   Stress:   . Feeling of Stress :   Social Connections:   . Frequency of Communication with Friends and Family:   . Frequency of Social Gatherings with Friends and Family:   . Attends Religious Services:   . Active Member of Clubs or Organizations:   . Attends Archivist Meetings:   Marland Kitchen Marital Status:   Intimate Partner Violence:   . Fear of Current or Ex-Partner:   . Emotionally Abused:   Marland Kitchen Physically Abused:   . Sexually Abused:     Outpatient Encounter Medications as of 06/18/2019  Medication Sig  . atorvastatin (LIPITOR) 20 MG tablet Take 1 tablet (20 mg total) by mouth daily.  . fluticasone (FLONASE) 50 MCG/ACT nasal spray Place 2 sprays into both nostrils daily.  Marland Kitchen gabapentin (NEURONTIN) 300 MG/6ML solution Take 2 mLs (100 mg total) by mouth 3 (three) times daily.  Marland Kitchen omeprazole (PRILOSEC) 20 MG capsule Take 20 mg by mouth 2 (two) times daily before a meal.   . Eflornithine HCl 13.9 % cream Apply 1 application topically 2 (two) times daily with a meal.  . [DISCONTINUED] Melatonin 3 MG CAPS Take 1 capsule by mouth at bedtime.  . [DISCONTINUED] Multiple Vitamins-Minerals (MULTIVITAMIN ADULT) CHEW Chew 1 each by mouth daily.   No facility-administered encounter medications on file as of 06/18/2019.    Allergies  Allergen Reactions  . Clindamycin/Lincomycin Other (See Comments)    "stuck in throat" cannot take pill form; can take as liquid  . Penicillins Swelling and Rash    Childhood reaction and family history  Has patient had a PCN reaction causing immediate rash, facial/tongue/throat swelling, SOB or lightheadedness with hypotension: Unknown Has patient had a PCN reaction causing severe rash involving mucus membranes or skin necrosis: Unknown Has patient had a PCN reaction that required hospitalization: Unknown Has patient had a PCN reaction occurring within the last 10 years: No If all  of the above answers are "NO", then may proceed with Cephalosporin use.     Review of Systems  Constitutional: Positive for activity change, fatigue and unexpected weight change (gain of 15 pounds). Negative for appetite change, chills, diaphoresis and fever.  HENT: Negative.   Eyes: Negative.  Negative for photophobia and visual disturbance.  Respiratory: Negative for cough, chest tightness and shortness of breath.   Cardiovascular: Negative for chest pain, palpitations and leg swelling.  Gastrointestinal: Negative for abdominal pain, blood in stool, constipation, diarrhea, nausea and vomiting.  Endocrine: Positive for polyuria. Negative for polyphagia.  Genitourinary: Positive for frequency. Negative for decreased urine volume, difficulty urinating, dyspareunia, dysuria, flank pain, genital sores, hematuria, menstrual problem, pelvic pain, urgency, vaginal bleeding, vaginal discharge and vaginal pain.  Musculoskeletal: Positive for arthralgias, back pain  and myalgias. Negative for gait problem, joint swelling, neck pain and neck stiffness.  Skin:       Dark hair growth under chin  Allergic/Immunologic: Negative.   Neurological: Negative for dizziness, tremors, seizures, syncope, facial asymmetry, speech difficulty, weakness, light-headedness and numbness.  Hematological: Negative.   Psychiatric/Behavioral: Positive for sleep disturbance. Negative for confusion, hallucinations and suicidal ideas.  All other systems reviewed and are negative.       Objective:  BP 114/75   Pulse 84   Temp 97.8 F (36.6 C)   Resp 20   Ht 5' 10" (1.778 m)   Wt 233 lb (105.7 kg)   LMP 06/01/2019 Comment: they vary ( 2- 5 days)   SpO2 98%   BMI 33.43 kg/m    Wt Readings from Last 3 Encounters:  06/18/19 233 lb (105.7 kg)  12/17/18 229 lb (103.9 kg)  09/15/18 218 lb (98.9 kg)    Physical Exam Vitals and nursing note reviewed.  Constitutional:      General: She is not in acute distress.     Appearance: Normal appearance. She is well-developed and well-groomed. She is obese. She is not ill-appearing, toxic-appearing or diaphoretic.  HENT:     Head: Normocephalic and atraumatic.     Jaw: There is normal jaw occlusion.     Right Ear: Hearing, tympanic membrane, ear canal and external ear normal.     Left Ear: Hearing, tympanic membrane, ear canal and external ear normal.     Nose: Nose normal.     Mouth/Throat:     Lips: Pink.     Mouth: Mucous membranes are moist.     Pharynx: Oropharynx is clear. Uvula midline.  Eyes:     General: Lids are normal.     Extraocular Movements: Extraocular movements intact.     Conjunctiva/sclera: Conjunctivae normal.     Pupils: Pupils are equal, round, and reactive to light.  Neck:     Thyroid: No thyroid mass, thyromegaly or thyroid tenderness.     Vascular: No carotid bruit or JVD.     Trachea: Trachea and phonation normal.  Cardiovascular:     Rate and Rhythm: Normal rate and regular rhythm.     Chest Wall: PMI is not displaced.     Pulses: Normal pulses.     Heart sounds: Normal heart sounds. No murmur. No friction rub. No gallop.   Pulmonary:     Effort: Pulmonary effort is normal. No respiratory distress.     Breath sounds: Examination of the right-upper field reveals rhonchi. Rhonchi (RUL) present. No wheezing.  Abdominal:     General: Bowel sounds are normal. There is no distension or abdominal bruit.     Palpations: Abdomen is soft. There is no hepatomegaly or splenomegaly.     Tenderness: There is no abdominal tenderness. There is no right CVA tenderness or left CVA tenderness.     Hernia: No hernia is present. There is no hernia in the left inguinal area or right inguinal area.  Genitourinary:    General: Normal vulva.     Exam position: Lithotomy position.     Pubic Area: No rash or pubic lice.      Tanner stage (genital): 5.     Labia:        Right: No rash, tenderness, lesion or injury.        Left: No rash,  tenderness, lesion or injury.      Urethra: No prolapse, urethral pain, urethral swelling or urethral  lesion.     Vagina: No signs of injury and foreign body. Vaginal discharge (thin, white discharge) present. No erythema, tenderness, bleeding, lesions or prolapsed vaginal walls.     Cervix: Normal.     Uterus: Normal.      Adnexa: Right adnexa normal and left adnexa normal.  Musculoskeletal:     Right shoulder: Decreased range of motion.     Left shoulder: Decreased range of motion.     Right hand: Decreased strength.     Cervical back: Normal range of motion and neck supple.     Right lower leg: No edema.     Left lower leg: No edema.     Comments: Limited passive ROM in shoulders bilaterally.  Lymphadenopathy:     Cervical: No cervical adenopathy.     Lower Body: No right inguinal adenopathy. No left inguinal adenopathy.  Skin:    General: Skin is warm and dry.     Capillary Refill: Capillary refill takes less than 2 seconds.     Coloration: Skin is not cyanotic, jaundiced or pale.     Findings: No rash.  Neurological:     General: No focal deficit present.     Mental Status: She is alert and oriented to person, place, and time.     Cranial Nerves: Cranial nerves are intact. No cranial nerve deficit.     Sensory: Sensation is intact. No sensory deficit.     Motor: Motor function is intact. No weakness.     Coordination: Coordination is intact. Coordination normal.     Gait: Gait is intact. Gait normal.     Deep Tendon Reflexes: Reflexes are normal and symmetric. Reflexes normal.  Psychiatric:        Attention and Perception: Attention and perception normal.        Mood and Affect: Mood and affect normal.        Speech: Speech normal.        Behavior: Behavior normal. Behavior is cooperative.        Thought Content: Thought content normal.        Cognition and Memory: Cognition and memory normal.        Judgment: Judgment normal.     Results for orders placed or performed  in visit on 12/17/18  CMP14+EGFR  Result Value Ref Range   Glucose 79 65 - 99 mg/dL   BUN 13 6 - 24 mg/dL   Creatinine, Ser 0.71 0.57 - 1.00 mg/dL   GFR calc non Af Amer 104 >59 mL/min/1.73   GFR calc Af Amer 120 >59 mL/min/1.73   BUN/Creatinine Ratio 18 9 - 23   Sodium 138 134 - 144 mmol/L   Potassium 4.5 3.5 - 5.2 mmol/L   Chloride 102 96 - 106 mmol/L   CO2 25 20 - 29 mmol/L   Calcium 9.1 8.7 - 10.2 mg/dL   Total Protein 6.6 6.0 - 8.5 g/dL   Albumin 4.0 3.8 - 4.8 g/dL   Globulin, Total 2.6 1.5 - 4.5 g/dL   Albumin/Globulin Ratio 1.5 1.2 - 2.2   Bilirubin Total <0.2 0.0 - 1.2 mg/dL   Alkaline Phosphatase 66 39 - 117 IU/L   AST 18 0 - 40 IU/L   ALT 16 0 - 32 IU/L  Lipid panel  Result Value Ref Range   Cholesterol, Total 217 (H) 100 - 199 mg/dL   Triglycerides 120 0 - 149 mg/dL   HDL 40 >39 mg/dL   VLDL Cholesterol Cal 22 5 -  40 mg/dL   LDL Chol Calc (NIH) 155 (H) 0 - 99 mg/dL   Chol/HDL Ratio 5.4 (H) 0.0 - 4.4 ratio     Wet prep and urinalysis unremarkable in office.   Pertinent labs & imaging results that were available during my care of the patient were reviewed by me and considered in my medical decision making.  Assessment & Plan:  Hadlei was seen today for gynecologic exam and annual exam.  Diagnoses and all orders for this visit:  Annual physical exam Health maintenance discussed. Diet and exercise encouraged. Labs pending.  -     CBC with Differential/Platelet -     CMP14+EGFR -     Lipid panel -     Thyroid Panel With TSH -     Vitamin B12 -     VITAMIN D 25 Hydroxy (Vit-D Deficiency, Fractures) -     IGP, Aptima HPV, rfx 16/18,45  Low grade squamous intraepithelial lesion on cytologic smear of cervix (LGSIL) -     IGP, Aptima HPV, rfx 16/18,45  BMI 33.0-33.9,adult Diet and exercise encouraged. Labs pending.  -     CBC with Differential/Platelet -     CMP14+EGFR -     Lipid panel -     Thyroid Panel With TSH  Fatigue, unspecified type Labs pending.   -     CBC with Differential/Platelet -     CMP14+EGFR -     Vitamin B12 -     VITAMIN D 25 Hydroxy (Vit-D Deficiency, Fractures)  Multiple joint pain Continued pain. Will check below today.  -     CMP14+EGFR -     Vitamin B12 -     VITAMIN D 25 Hydroxy (Vit-D Deficiency, Fractures)  Urinary frequency Urinalysis unremarkable in office. Avoid excessive caffeine and oral intake prior to going to bed.  -     Urinalysis, Complete  Pure hypercholesterolemia Diet encouraged - increase intake of fresh fruits and vegetables, increase intake of lean proteins. Bake, broil, or grill foods. Avoid fried, greasy, and fatty foods. Avoid fast foods. Increase intake of fiber-rich whole grains. Exercise encouraged - at least 150 minutes per week and advance as tolerated.  Goal BMI < 25. Continue medications as prescribed. Follow up in 3-6 months as discussed.  -     Lipid panel  Vaginal discharge Wet prep negative. Likely hormonal discharge as her cycle just ended.  -     WET PREP FOR TRICH, YEAST, CLUE  Hirsutism Will trial below. Pt aware to report any new, worsening, or persistent symptoms.  -     Eflornithine HCl 13.9 % cream; Apply 1 application topically 2 (two) times daily with a meal.     Continue all other maintenance medications.  Follow up plan: Return in 6 months (on 12/19/2019), or if symptoms worsen or fail to improve.  Continue healthy lifestyle choices, including diet (rich in fruits, vegetables, and lean proteins, and low in salt and simple carbohydrates) and exercise (at least 30 minutes of moderate physical activity daily).  Educational handout given for heart healthy diet.  The above assessment and management plan was discussed with the patient. The patient verbalized understanding of and has agreed to the management plan. Patient is aware to call the clinic if they develop any new symptoms or if symptoms persist or worsen. Patient is aware when to return to the clinic for a  follow-up visit. Patient educated on when it is appropriate to go to the emergency department.  Robynn Pane, FNP student [facility] [phone]  I personally was present during the history, physical exam, and medical decision-making activities of this service and have verified that the service and findings are accurately documented in the nurse practitioner student's note.  Monia Pouch, FNP-C [facility] [address] [place], [place] [address] [phone]

## 2019-06-18 NOTE — Patient Instructions (Signed)
Health Maintenance, Female Adopting a healthy lifestyle and getting preventive care are important in promoting health and wellness. Ask your health care provider about:  The right schedule for you to have regular tests and exams.  Things you can do on your own to prevent diseases and keep yourself healthy. What should I know about diet, weight, and exercise? Eat a healthy diet   Eat a diet that includes plenty of vegetables, fruits, low-fat dairy products, and lean protein.  Do not eat a lot of foods that are high in solid fats, added sugars, or sodium. Maintain a healthy weight Body mass index (BMI) is used to identify weight problems. It estimates body fat based on height and weight. Your health care provider can help determine your BMI and help you achieve or maintain a healthy weight. Get regular exercise Get regular exercise. This is one of the most important things you can do for your health. Most adults should:  Exercise for at least 150 minutes each week. The exercise should increase your heart rate and make you sweat (moderate-intensity exercise).  Do strengthening exercises at least twice a week. This is in addition to the moderate-intensity exercise.  Spend less time sitting. Even light physical activity can be beneficial. Watch cholesterol and blood lipids Have your blood tested for lipids and cholesterol at 45 years of age, then have this test every 5 years. Have your cholesterol levels checked more often if:  Your lipid or cholesterol levels are high.  You are older than 45 years of age.  You are at high risk for heart disease. What should I know about cancer screening? Depending on your health history and family history, you may need to have cancer screening at various ages. This may include screening for:  Breast cancer.  Cervical cancer.  Colorectal cancer.  Skin cancer.  Lung cancer. What should I know about heart disease, diabetes, and high blood  pressure? Blood pressure and heart disease  High blood pressure causes heart disease and increases the risk of stroke. This is more likely to develop in people who have high blood pressure readings, are of African descent, or are overweight.  Have your blood pressure checked: ? Every 3-5 years if you are 18-39 years of age. ? Every year if you are 40 years old or older. Diabetes Have regular diabetes screenings. This checks your fasting blood sugar level. Have the screening done:  Once every three years after age 40 if you are at a normal weight and have a low risk for diabetes.  More often and at a younger age if you are overweight or have a high risk for diabetes. What should I know about preventing infection? Hepatitis B If you have a higher risk for hepatitis B, you should be screened for this virus. Talk with your health care provider to find out if you are at risk for hepatitis B infection. Hepatitis C Testing is recommended for:  Everyone born from 1945 through 1965.  Anyone with known risk factors for hepatitis C. Sexually transmitted infections (STIs)  Get screened for STIs, including gonorrhea and chlamydia, if: ? You are sexually active and are younger than 45 years of age. ? You are older than 45 years of age and your health care provider tells you that you are at risk for this type of infection. ? Your sexual activity has changed since you were last screened, and you are at increased risk for chlamydia or gonorrhea. Ask your health care provider if   you are at risk.  Ask your health care provider about whether you are at high risk for HIV. Your health care provider may recommend a prescription medicine to help prevent HIV infection. If you choose to take medicine to prevent HIV, you should first get tested for HIV. You should then be tested every 3 months for as long as you are taking the medicine. Pregnancy  If you are about to stop having your period (premenopausal) and  you may become pregnant, seek counseling before you get pregnant.  Take 400 to 800 micrograms (mcg) of folic acid every day if you become pregnant.  Ask for birth control (contraception) if you want to prevent pregnancy. Osteoporosis and menopause Osteoporosis is a disease in which the bones lose minerals and strength with aging. This can result in bone fractures. If you are 65 years old or older, or if you are at risk for osteoporosis and fractures, ask your health care provider if you should:  Be screened for bone loss.  Take a calcium or vitamin D supplement to lower your risk of fractures.  Be given hormone replacement therapy (HRT) to treat symptoms of menopause. Follow these instructions at home: Lifestyle  Do not use any products that contain nicotine or tobacco, such as cigarettes, e-cigarettes, and chewing tobacco. If you need help quitting, ask your health care provider.  Do not use street drugs.  Do not share needles.  Ask your health care provider for help if you need support or information about quitting drugs. Alcohol use  Do not drink alcohol if: ? Your health care provider tells you not to drink. ? You are pregnant, may be pregnant, or are planning to become pregnant.  If you drink alcohol: ? Limit how much you use to 0-1 drink a day. ? Limit intake if you are breastfeeding.  Be aware of how much alcohol is in your drink. In the [place] one drink equals one 12 oz bottle of beer (355 mL), one 5 oz glass of wine (148 mL), or one 1 oz glass of hard liquor (44 mL). General instructions  Schedule regular health, dental, and eye exams.  Stay current with your vaccines.  Tell your health care provider if: ? You often feel depressed. ? You have ever been abused or do not feel safe at home. Summary  Adopting a healthy lifestyle and getting preventive care are important in promoting health and wellness.  Follow your health care provider's instructions about healthy  diet, exercising, and getting tested or screened for diseases.  Follow your health care provider's instructions on monitoring your cholesterol and blood pressure. This information is not intended to replace advice given to you by your health care provider. Make sure you discuss any questions you have with your health care provider. Document Revised: 03/12/2018 Document Reviewed: 03/12/2018 Elsevier Patient Education  2020 [employer]  

## 2019-06-19 LAB — CMP14+EGFR
ALT: 12 IU/L (ref 0–32)
AST: 18 IU/L (ref 0–40)
Albumin/Globulin Ratio: 1.6 (ref 1.2–2.2)
Albumin: 4.2 g/dL (ref 3.8–4.8)
Alkaline Phosphatase: 77 IU/L (ref 39–117)
BUN/Creatinine Ratio: 10 (ref 9–23)
BUN: 9 mg/dL (ref 6–24)
Bilirubin Total: <0.2 mg/dL (ref 0.0–1.2)
CO2: 22 mmol/L (ref 20–29)
Calcium: 9.7 mg/dL (ref 8.7–10.2)
Chloride: 102 mmol/L (ref 96–106)
Creatinine, Ser: 0.89 mg/dL (ref 0.57–1.00)
GFR calc Af Amer: 91 mL/min/{1.73_m2} (ref >59)
GFR calc non Af Amer: 79 mL/min/{1.73_m2} (ref >59)
Globulin, Total: 2.7 g/dL (ref 1.5–4.5)
Glucose: 84 mg/dL (ref 65–99)
Potassium: 5.1 mmol/L (ref 3.5–5.2)
Sodium: 137 mmol/L (ref 134–144)
Total Protein: 6.9 g/dL (ref 6.0–8.5)

## 2019-06-19 LAB — CBC WITH DIFFERENTIAL/PLATELET
Basophils Absolute: 0.1 10*3/uL (ref 0.0–0.2)
Basos: 1 %
EOS (ABSOLUTE): 0.2 10*3/uL (ref 0.0–0.4)
Eos: 2 %
Hematocrit: 37.1 % (ref 34.0–46.6)
Hemoglobin: 12.2 g/dL (ref 11.1–15.9)
Immature Grans (Abs): 0 10*3/uL (ref 0.0–0.1)
Immature Granulocytes: 0 %
Lymphocytes Absolute: 2.4 10*3/uL (ref 0.7–3.1)
Lymphs: 25 %
MCH: 27.5 pg (ref 26.6–33.0)
MCHC: 32.9 g/dL (ref 31.5–35.7)
MCV: 84 fL (ref 79–97)
Monocytes Absolute: 0.8 10*3/uL (ref 0.1–0.9)
Monocytes: 8 %
Neutrophils Absolute: 6.1 10*3/uL (ref 1.4–7.0)
Neutrophils: 64 %
Platelets: 292 10*3/uL (ref 150–450)
RBC: 4.44 x10E6/uL (ref 3.77–5.28)
RDW: 13.9 % (ref 11.7–15.4)
WBC: 9.6 10*3/uL (ref 3.4–10.8)

## 2019-06-19 LAB — THYROID PANEL WITH TSH
Free Thyroxine Index: 2 (ref 1.2–4.9)
T3 Uptake Ratio: 27 % (ref 24–39)
T4, Total: 7.5 ug/dL (ref 4.5–12.0)
TSH: 1.88 u[IU]/mL (ref 0.450–4.500)

## 2019-06-19 LAB — LIPID PANEL
Chol/HDL Ratio: 8.1 ratio — ABNORMAL HIGH (ref 0.0–4.4)
Cholesterol, Total: 275 mg/dL — ABNORMAL HIGH (ref 100–199)
HDL: 34 mg/dL — ABNORMAL LOW (ref >39)
LDL Chol Calc (NIH): 214 mg/dL — ABNORMAL HIGH (ref 0–99)
Triglycerides: 144 mg/dL (ref 0–149)
VLDL Cholesterol Cal: 27 mg/dL (ref 5–40)

## 2019-06-19 LAB — URINE CULTURE: Organism ID, Bacteria: NO GROWTH

## 2019-06-19 LAB — VITAMIN D 25 HYDROXY (VIT D DEFICIENCY, FRACTURES): Vit D, 25-Hydroxy: 26.6 ng/mL — ABNORMAL LOW (ref 30.0–100.0)

## 2019-06-19 LAB — VITAMIN B12: Vitamin B-12: 329 pg/mL (ref 232–1245)

## 2019-06-23 LAB — IGP, APTIMA HPV, RFX 16/18,45: HPV Aptima: NEGATIVE

## 2019-07-27 ENCOUNTER — Other Ambulatory Visit: Payer: Self-pay | Admitting: *Deleted

## 2019-07-27 DIAGNOSIS — E7800 Pure hypercholesterolemia, unspecified: Secondary | ICD-10-CM

## 2019-07-27 MED ORDER — ATORVASTATIN CALCIUM 20 MG PO TABS: 20.0000 mg | ORAL_TABLET | Freq: Every day | ORAL | 1 refills | Status: DC

## 2019-12-22 ENCOUNTER — Ambulatory Visit: Payer: Self-pay | Admitting: Family

## 2020-11-18 ENCOUNTER — Ambulatory Visit (INDEPENDENT_AMBULATORY_CARE_PROVIDER_SITE_OTHER): Payer: BC Managed Care – PPO | Admitting: Family Medicine

## 2020-11-18 ENCOUNTER — Encounter: Payer: Self-pay | Admitting: Family Medicine

## 2020-11-18 ENCOUNTER — Ambulatory Visit (INDEPENDENT_AMBULATORY_CARE_PROVIDER_SITE_OTHER): Payer: BC Managed Care – PPO

## 2020-11-18 ENCOUNTER — Other Ambulatory Visit: Payer: Self-pay

## 2020-11-18 VITALS — BP 132/81 | HR 77 | Temp 97.0°F | Resp 20 | Ht 70.0 in | Wt 227.0 lb

## 2020-11-18 DIAGNOSIS — L659 Nonscarring hair loss, unspecified: Secondary | ICD-10-CM | POA: Diagnosis not present

## 2020-11-18 DIAGNOSIS — H0520 Unspecified exophthalmos: Secondary | ICD-10-CM | POA: Diagnosis not present

## 2020-11-18 DIAGNOSIS — R5383 Other fatigue: Secondary | ICD-10-CM

## 2020-11-18 DIAGNOSIS — M79672 Pain in left foot: Secondary | ICD-10-CM

## 2020-11-18 DIAGNOSIS — R6889 Other general symptoms and signs: Secondary | ICD-10-CM

## 2020-11-18 DIAGNOSIS — Z1231 Encounter for screening mammogram for malignant neoplasm of breast: Secondary | ICD-10-CM

## 2020-11-18 DIAGNOSIS — M7989 Other specified soft tissue disorders: Secondary | ICD-10-CM | POA: Diagnosis not present

## 2020-11-18 DIAGNOSIS — R234 Changes in skin texture: Secondary | ICD-10-CM

## 2020-11-18 IMAGING — DX DG FOOT COMPLETE 3+V*L*
3 series · 3 of 3 positions shown · non-contrast
Comparison: None.

CLINICAL DATA: Pain and swelling of the foot.

EXAM:
LEFT FOOT - COMPLETE 3+ VIEW

[foot ap]
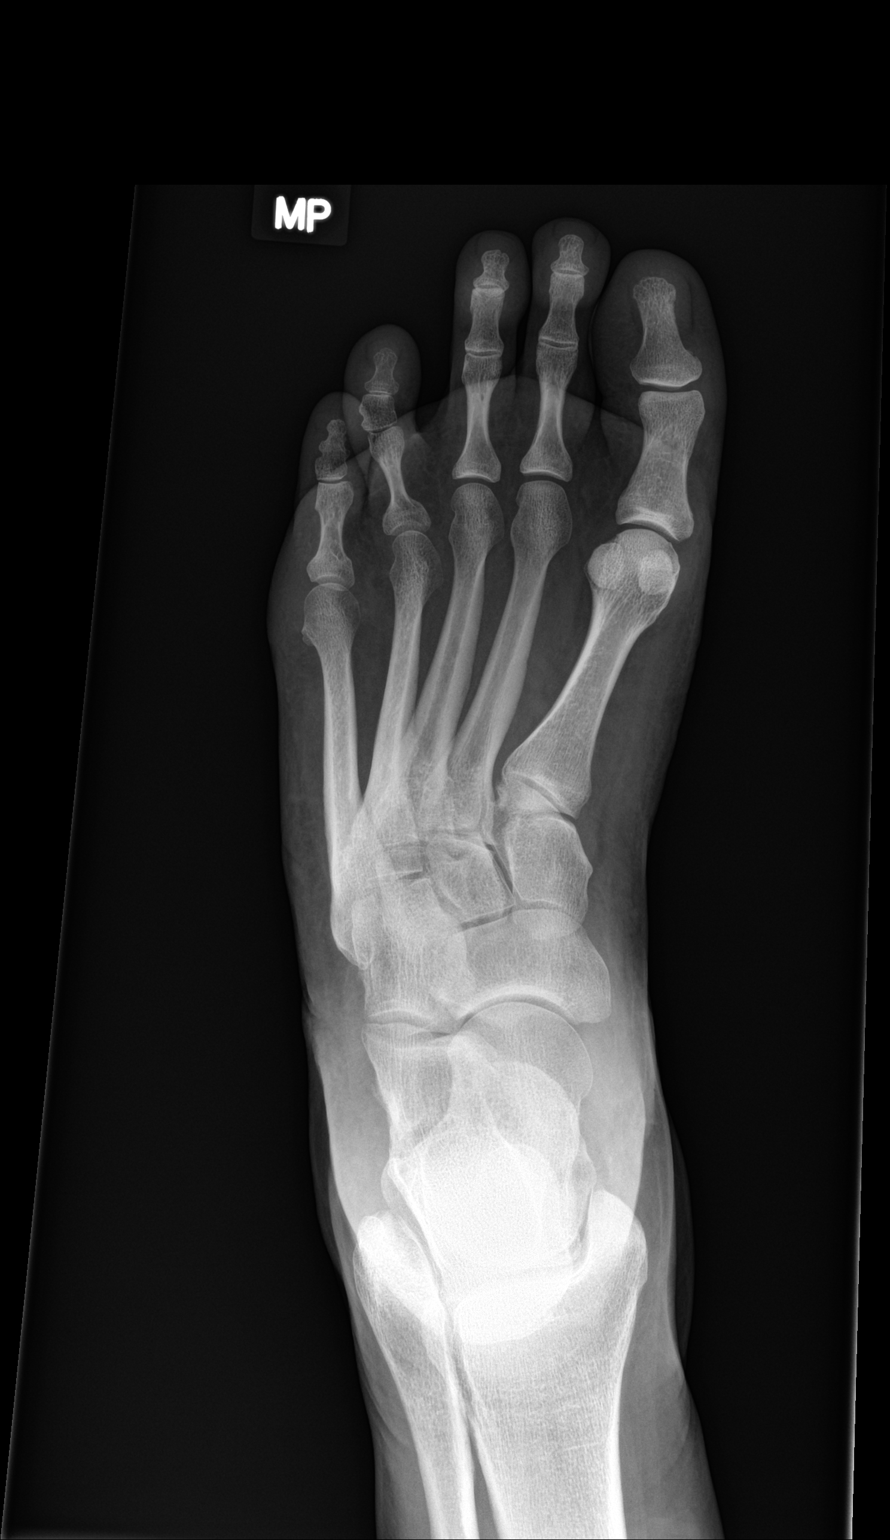

[foot obl]
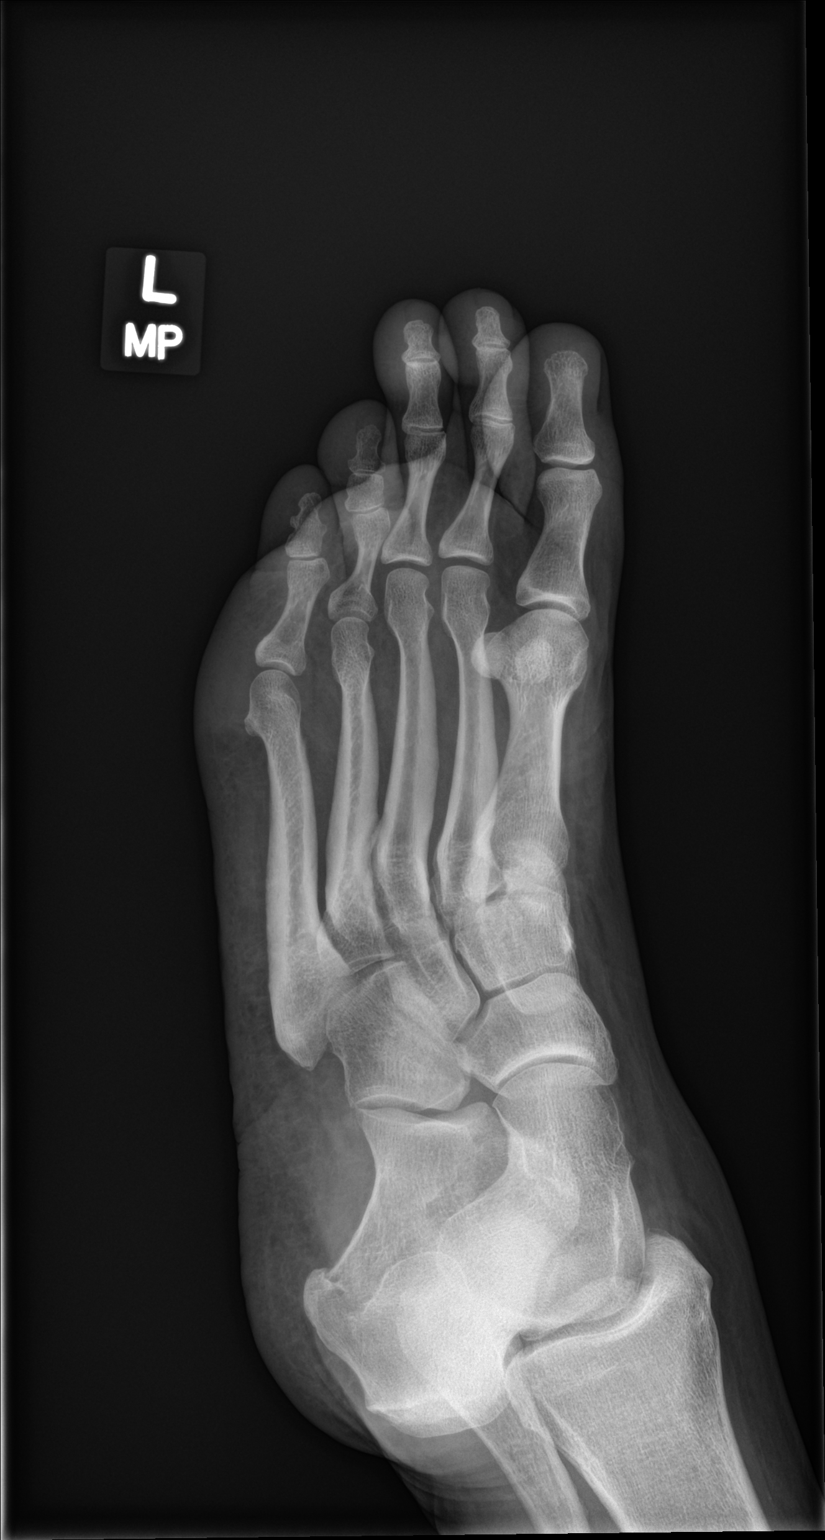

[foot lat]
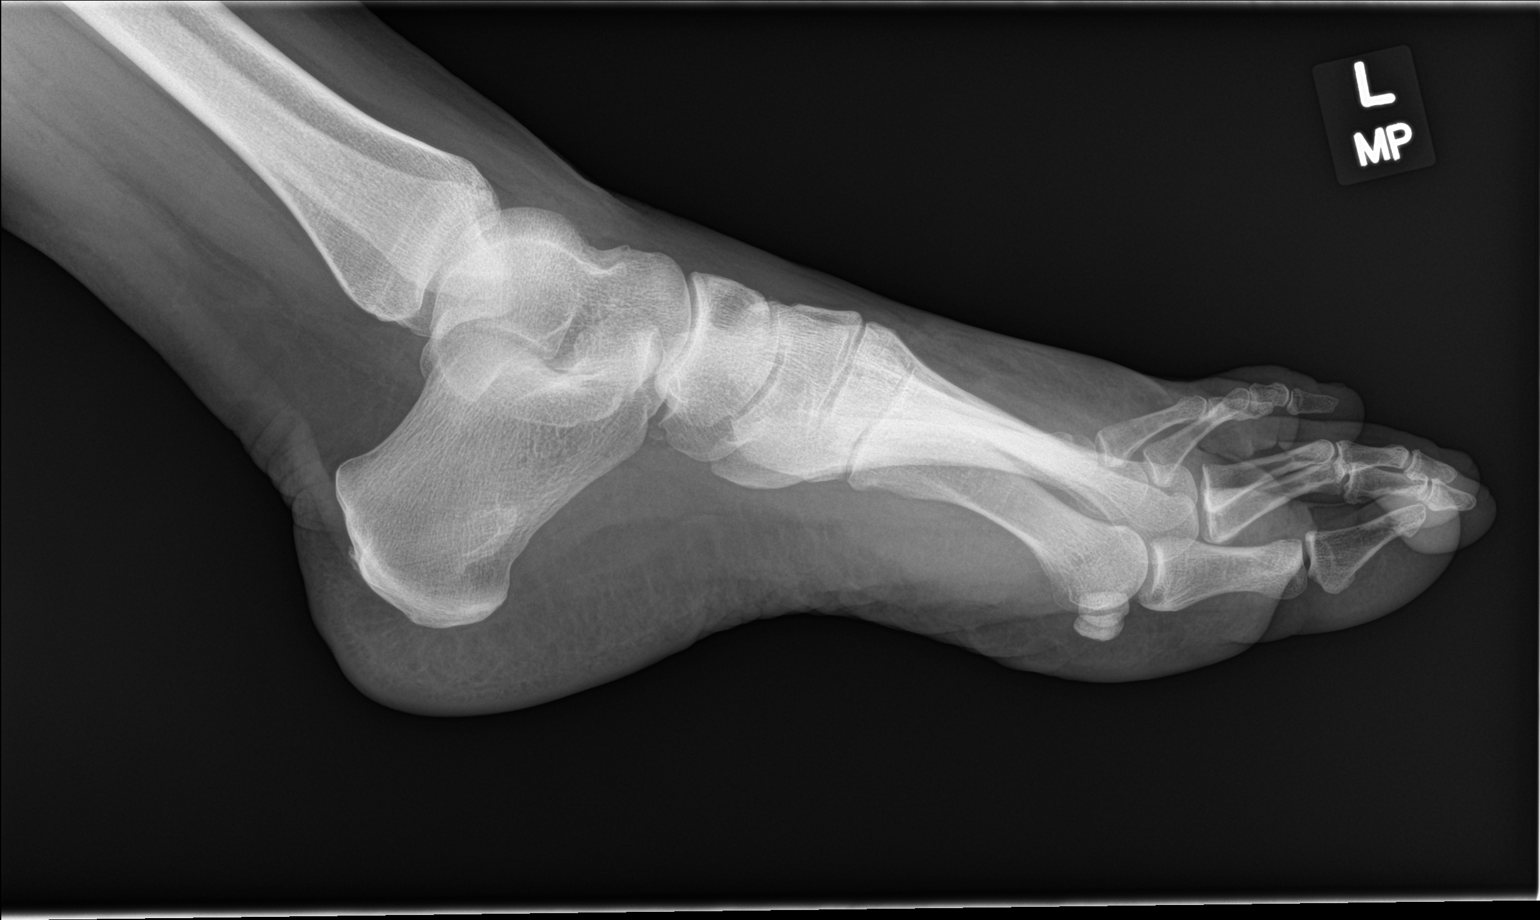

[3 of 3 positions shown; findings below may reference images not displayed]

FINDINGS: No acute fracture or subluxation. Soft tissue swelling noted along
the dorsal aspect of the forefoot. There is no radiopaque foreign
body or soft tissue gas.
IMPRESSION: Soft tissue swelling.  No evidence for acute osseous abnormality.

## 2020-11-18 NOTE — Patient Instructions (Signed)
You had labs performed today.  You will be contacted with the abnormal results once they are available, usually in the next 3 business days for routine lab work.  If you had STI testing, a pap smear, or a biopsy performed, expect to be contacted in about 7-10 days.  If results are normal, you will not be notified.  It was a pleasure seeing you today, Emily Boone.  Information regarding what we discussed is included in this packet.  Please make an appointment to see me in 3 months for physical.    If you had labs performed today, you will be contacted with the abnormal results once they are available, usually in the next 3 business days for routine lab work.  If you had STI testing, a pap smear, or a biopsy performed, expect to be contacted in about 7-10 days. If results are normal, you will not be notified.    In a few days you may receive a survey in the mail or online from [employer] regarding your visit with [place] today. Please take a moment to fill this out. Your feedback is very important to our office. It can help [place] better understand your needs as well as improve your experience and satisfaction. Thank you for taking your time to complete it. We care about you.  Because of recent events of COVID-19 ("Coronavirus"), please follow CDC recommendations:   Wash your hand frequently Avoid touching your face Stay away from people who are sick If you have symptoms such as fever, cough, shortness of breath then call your healthcare provider for further guidance If you are sick, STAY AT HOME, unless otherwise directed by your healthcare provider. Follow directions from state and national officials regarding staying safe    Please feel free to call our office if any questions or concerns arise.  Warm Regards, Monia Pouch, FNP-C [facility] [address] [place], [place] [address] [phone]

## 2020-11-18 NOTE — Progress Notes (Signed)
Subjective:  [name], female    DOB: [birth year], 45 y.o.   MRN: [id number]  Patient Care Team: Baruch Gouty, FNP as PCP - General (Family Medicine)   Chief Complaint:  Foot Pain and thyroid check   HPI: Emily Boone is a 45 y.o. female presenting on 11/18/2020 for Foot Pain and thyroid check   Pt presents today with concerns about her thyroid. States she was seen by her eye doctor and was told she had early signs of thyroid eye disease. She states she does experience visual changes at times and her eyes water. She did not notice her left eye was larger than her right until pointed out at visit. She states over the last several months she had had increased fatigue, heat intolerance, dry skin, brittle nails, hair loss, and slight changes in her bowel habits. She denies prior or family history of thyroid disease. Last thyroid panel 06/2019 normal. She states she is not as active as she used to be and lacks concentration at times. She also reports ongoing left foot pain with swelling for over a month.  Foot Pain This is a recurrent problem. The current episode started more than 1 month ago. The problem occurs intermittently. The problem has been waxing and waning. Associated symptoms include arthralgias, a change in bowel habit, fatigue, myalgias and a visual change. Pertinent negatives include no abdominal pain, anorexia, chest pain, chills, congestion, coughing, diaphoresis, fever, headaches, joint swelling, nausea, neck pain, numbness, rash, sore throat, swollen glands, urinary symptoms, vertigo, vomiting or weakness. The symptoms are aggravated by walking, twisting and standing. She has tried acetaminophen, position changes and immobilization for the symptoms. The treatment provided mild relief.  Relevant past medical, surgical, family, and social history reviewed and updated as indicated.  Allergies and medications reviewed and updated. Data reviewed: Chart in  Epic.   Past Medical History:  Diagnosis Date   Anxiety    Depression    GERD (gastroesophageal reflux disease)    History of kidney stones    PTSD (post-traumatic stress disorder)     Past Surgical History:  Procedure Laterality Date   CHOLECYSTECTOMY     EPIGASTRIC HERNIA REPAIR N/A 12/14/2016   Procedure: OPEN REPAIR OF EPIGASTRIC HERNIA ;  Surgeon: Clovis Riley, MD;  Location: [facility];  Service: General;  Laterality: N/A;   HERNIA REPAIR     INSERTION OF MESH N/A 12/14/2016   Procedure: INSERTION OF MESH;  Surgeon: Clovis Riley, MD;  Location: [facility];  Service: General;  Laterality: N/A;   KNEE ARTHROSCOPY Right    TUBAL LIGATION     UMBILICAL HERNIA REPAIR N/A 12/14/2016   Procedure: OPEN REPAIR OF UMBILICAL HERNIA;  Surgeon: Clovis Riley, MD;  Location: [facility];  Service: General;  Laterality: N/A;    Social History   Socioeconomic History   Marital status: Married    Spouse name: Not on file   Number of children: Not on file   Years of education: Not on file   Highest education level: Not on file  Occupational History   Not on file  Tobacco Use   Smoking status: Former    Packs/day: 0.10    Years: 17.00    Pack years: 1.70    Types: Cigarettes    Quit date: 09/01/2015    Years since quitting: 5.2   Smokeless tobacco: Never  Vaping Use   Vaping Use: Former  Substance and Sexual Activity   Alcohol  use: No   Drug use: No   Sexual activity: Not on file  Other Topics Concern   Not on file  Social History Narrative   Not on file   Social Determinants of Health   Financial Resource Strain: Not on file  Food Insecurity: Not on file  Transportation Needs: Not on file  Physical Activity: Not on file  Stress: Not on file  Social Connections: Not on file  Intimate Partner Violence: Not on file    Outpatient Encounter Medications as of 11/18/2020  Medication Sig   atorvastatin (LIPITOR) 20 MG tablet Take 1 tablet (20 mg total) by mouth daily.  (Patient not taking: Reported on 11/18/2020)   fluticasone (FLONASE) 50 MCG/ACT nasal spray Place 2 sprays into both nostrils daily. (Patient not taking: Reported on 11/18/2020)   gabapentin (NEURONTIN) 300 MG/6ML solution Take 2 mLs (100 mg total) by mouth 3 (three) times daily. (Patient not taking: Reported on 11/18/2020)   omeprazole (PRILOSEC) 20 MG capsule Take 20 mg by mouth 2 (two) times daily before a meal.  (Patient not taking: Reported on 11/18/2020)   No facility-administered encounter medications on file as of 11/18/2020.    Allergies  Allergen Reactions   Clindamycin/Lincomycin Other (See Comments)    "stuck in throat" cannot take pill form; can take as liquid   Penicillins Swelling and Rash    Childhood reaction and family history  Has patient had a PCN reaction causing immediate rash, facial/tongue/throat swelling, SOB or lightheadedness with hypotension: Unknown Has patient had a PCN reaction causing severe rash involving mucus membranes or skin necrosis: Unknown Has patient had a PCN reaction that required hospitalization: Unknown Has patient had a PCN reaction occurring within the last 10 years: No If all of the above answers are "NO", then may proceed with Cephalosporin use.     Review of Systems  Constitutional:  Positive for activity change and fatigue. Negative for appetite change, chills, diaphoresis, fever and unexpected weight change.  HENT: Negative.  Negative for congestion and sore throat.   Eyes:  Positive for visual disturbance. Negative for photophobia, pain, discharge, redness and itching.       Proptosis of left eye  Respiratory:  Negative for cough, chest tightness and shortness of breath.   Cardiovascular:  Negative for chest pain, palpitations and leg swelling.  Gastrointestinal:  Positive for change in bowel habit. Negative for abdominal pain, anorexia, blood in stool, constipation, diarrhea, nausea and vomiting.  Endocrine: Positive for heat intolerance.  Negative for cold intolerance, polydipsia, polyphagia and polyuria.  Genitourinary:  Negative for decreased urine volume, difficulty urinating, dyspareunia, dysuria, enuresis, flank pain, frequency, genital sores, hematuria, menstrual problem, pelvic pain, urgency, vaginal bleeding, vaginal discharge and vaginal pain.  Musculoskeletal:  Positive for arthralgias, gait problem and myalgias. Negative for back pain, joint swelling, neck pain and neck stiffness.  Skin:  Negative for rash.       Dry skin, hair loss, nails brittle  Allergic/Immunologic: Negative.   Neurological:  Negative for dizziness, vertigo, tremors, seizures, syncope, facial asymmetry, speech difficulty, weakness, light-headedness, numbness and headaches.  Hematological: Negative.   Psychiatric/Behavioral:  Positive for decreased concentration and sleep disturbance. Negative for agitation, behavioral problems, confusion, dysphoric mood, hallucinations, self-injury and suicidal ideas. The patient is not nervous/anxious and is not hyperactive.   All other systems reviewed and are negative.      Objective:  BP 132/81   Pulse 77   Temp (!) 97 F (36.1 C)   Resp 20  Ht '5\' 10"'$  (1.778 m)   Wt 227 lb (103 kg)   SpO2 98%   BMI 32.57 kg/m    Wt Readings from Last 3 Encounters:  11/18/20 227 lb (103 kg)  06/18/19 233 lb (105.7 kg)  12/17/18 229 lb (103.9 kg)    Physical Exam Vitals and nursing note reviewed.  Constitutional:      General: She is not in acute distress.    Appearance: Normal appearance. She is well-developed and well-groomed. She is obese. She is not ill-appearing, toxic-appearing or diaphoretic.  HENT:     Head: Normocephalic and atraumatic.     Jaw: There is normal jaw occlusion.     Right Ear: Hearing normal.     Left Ear: Hearing normal.     Nose: Nose normal.     Mouth/Throat:     Lips: Pink.     Mouth: Mucous membranes are moist.     Pharynx: Oropharynx is clear. Uvula midline.  Eyes:      General: Lids are normal. No allergic shiner, visual field deficit or scleral icterus.       Right eye: No foreign body, discharge or hordeolum.        Left eye: No foreign body, discharge or hordeolum.     Extraocular Movements: Extraocular movements intact.     Conjunctiva/sclera: Conjunctivae normal.     Pupils: Pupils are equal, round, and reactive to light.     Comments: Slight proptosis of left eye  Neck:     Thyroid: No thyroid mass, thyromegaly or thyroid tenderness.     Vascular: No carotid bruit or JVD.     Trachea: Trachea and phonation normal.  Cardiovascular:     Rate and Rhythm: Normal rate and regular rhythm.     Chest Wall: PMI is not displaced.     Pulses: Normal pulses.     Heart sounds: Normal heart sounds. No murmur heard.   No friction rub. No gallop.  Pulmonary:     Effort: Pulmonary effort is normal. No respiratory distress.     Breath sounds: Normal breath sounds. No wheezing.  Abdominal:     General: Bowel sounds are normal. There is no distension or abdominal bruit.     Palpations: Abdomen is soft. There is no hepatomegaly or splenomegaly.     Tenderness: There is no abdominal tenderness. There is no right CVA tenderness or left CVA tenderness.     Hernia: No hernia is present.  Musculoskeletal:     Cervical back: Normal range of motion and neck supple.     Right lower leg: Normal.     Left lower leg: Normal.     Right foot: Normal.     Left foot: Decreased range of motion (4th and 5th toes). Normal capillary refill. Swelling (minimal lateral foot swelling) and tenderness (lateral foot) present. No deformity, bunion, Charcot foot, foot drop, prominent metatarsal heads, laceration, bony tenderness or crepitus. Normal pulse.  Lymphadenopathy:     Cervical: No cervical adenopathy.  Skin:    General: Skin is warm and dry.     Capillary Refill: Capillary refill takes less than 2 seconds.     Coloration: Skin is not cyanotic, jaundiced or pale.     Findings:  No rash.  Neurological:     General: No focal deficit present.     Mental Status: She is alert and oriented to person, place, and time.     Cranial Nerves: Cranial nerves are intact. No cranial nerve deficit.  Sensory: Sensation is intact. No sensory deficit.     Motor: Motor function is intact. No weakness.     Coordination: Coordination is intact. Coordination normal.     Gait: Gait is intact. Gait normal.     Deep Tendon Reflexes: Reflexes are normal and symmetric. Reflexes normal.  Psychiatric:        Attention and Perception: Attention and perception normal.        Mood and Affect: Mood and affect normal.        Speech: Speech normal.        Behavior: Behavior normal. Behavior is cooperative.        Thought Content: Thought content normal.        Cognition and Memory: Cognition and memory normal.        Judgment: Judgment normal.    Results for orders placed or performed in visit on 06/18/19  WET PREP FOR [place], YEAST, CLUE   Specimen: Vaginal Fluid   VAGINAL FLUI  Result Value Ref Range   Trichomonas Exam Negative Negative   Yeast Exam Negative Negative   Clue Cell Exam Negative Negative  Urine Culture   Specimen: Urine   UR  Result Value Ref Range   Urine Culture, Routine Final report    Organism ID, Bacteria No growth   Microscopic Examination   URINE  Result Value Ref Range   WBC, UA 0-5 0 - 5 /hpf   RBC 3-10 (A) 0 - 2 /hpf   Epithelial Cells (non renal) 0-10 0 - 10 /hpf   Renal Epithel, UA None seen None seen /hpf   Mucus, UA Present Not Estab.   Bacteria, UA Few None seen/Few  CBC with Differential/Platelet  Result Value Ref Range   WBC 9.6 3.4 - 10.8 x10E3/uL   RBC 4.44 3.77 - 5.28 x10E6/uL   Hemoglobin 12.2 11.1 - 15.9 g/dL   Hematocrit 37.1 34.0 - 46.6 %   MCV 84 79 - 97 fL   MCH 27.5 26.6 - 33.0 pg   MCHC 32.9 31.5 - 35.7 g/dL   RDW 13.9 11.7 - 15.4 %   Platelets 292 150 - 450 x10E3/uL   Neutrophils 64 Not Estab. %   Lymphs 25 Not Estab. %    Monocytes 8 Not Estab. %   Eos 2 Not Estab. %   Basos 1 Not Estab. %   Neutrophils Absolute 6.1 1.4 - 7.0 x10E3/uL   Lymphocytes Absolute 2.4 0.7 - 3.1 x10E3/uL   Monocytes Absolute 0.8 0.1 - 0.9 x10E3/uL   EOS (ABSOLUTE) 0.2 0.0 - 0.4 x10E3/uL   Basophils Absolute 0.1 0.0 - 0.2 x10E3/uL   Immature Granulocytes 0 Not Estab. %   Immature Grans (Abs) 0.0 0.0 - 0.1 x10E3/uL  CMP14+EGFR  Result Value Ref Range   Glucose 84 65 - 99 mg/dL   BUN 9 6 - 24 mg/dL   Creatinine, Ser 0.89 0.57 - 1.00 mg/dL   GFR calc non Af Amer 79 >59 mL/min/1.73   GFR calc Af Amer 91 >59 mL/min/1.73   BUN/Creatinine Ratio 10 9 - 23   Sodium 137 134 - 144 mmol/L   Potassium 5.1 3.5 - 5.2 mmol/L   Chloride 102 96 - 106 mmol/L   CO2 22 20 - 29 mmol/L   Calcium 9.7 8.7 - 10.2 mg/dL   Total Protein 6.9 6.0 - 8.5 g/dL   Albumin 4.2 3.8 - 4.8 g/dL   Globulin, Total 2.7 1.5 - 4.5 g/dL   Albumin/Globulin Ratio 1.6 1.2 -  2.2   Bilirubin Total <0.2 0.0 - 1.2 mg/dL   Alkaline Phosphatase 77 39 - 117 IU/L   AST 18 0 - 40 IU/L   ALT 12 0 - 32 IU/L  Lipid panel  Result Value Ref Range   Cholesterol, Total 275 (H) 100 - 199 mg/dL   Triglycerides 144 0 - 149 mg/dL   HDL 34 (L) >39 mg/dL   VLDL Cholesterol Cal 27 5 - 40 mg/dL   LDL Chol Calc (NIH) 214 (H) 0 - 99 mg/dL   Chol/HDL Ratio 8.1 (H) 0.0 - 4.4 ratio  Thyroid Panel With TSH  Result Value Ref Range   TSH 1.880 0.450 - 4.500 uIU/mL   T4, Total 7.5 4.5 - 12.0 ug/dL   T3 Uptake Ratio 27 24 - 39 %   Free Thyroxine Index 2.0 1.2 - 4.9  Vitamin B12  Result Value Ref Range   Vitamin B-12 329 232 - 1,245 pg/mL  VITAMIN D 25 Hydroxy (Vit-D Deficiency, Fractures)  Result Value Ref Range   Vit D, 25-Hydroxy 26.6 (L) 30.0 - 100.0 ng/mL  Urinalysis, Complete  Result Value Ref Range   Specific Gravity, UA 1.025 1.005 - 1.030   pH, UA 5.5 5.0 - 7.5   Color, UA Yellow Yellow   Appearance Ur Clear Clear   Leukocytes,UA Negative Negative   Protein,UA 1+ (A)  Negative/Trace   Glucose, UA Negative Negative   Ketones, UA Trace (A) Negative   RBC, UA 2+ (A) Negative   Bilirubin, UA Negative Negative   Urobilinogen, Ur 0.2 0.2 - 1.0 mg/dL   Nitrite, UA Negative Negative   Microscopic Examination See below:   IGP, Aptima HPV, rfx 16/18,45  Result Value Ref Range   Interpretation NILM    Category NIL    [facility]    Clinician Provided ICD10 Comment    Performed by: Comment    Note: Comment    Test Methodology Comment    HPV Aptima Negative Negative     X-Ray: left foot: Concerning for fracture of proximal 4th and 5th metatarsals. Preliminary x-ray reading by Monia Pouch, FNP-C, [facility].   Pertinent labs & imaging results that were available during my care of the patient were reviewed by me and considered in my medical decision making.  Assessment & Plan:  Areil was seen today for foot pain and thyroid check.  Diagnoses and all orders for this visit:  Proptosis Slight proptosis of left eye. Will evaluate for thyroid abnormalities.  -     Thyroid Panel With TSH -     Thyroid antibodies  Hair loss Other fatigue Changes in skin texture Heat intolerance Multiple complaints concerning for underlying thyroid disease. Last thyroid panel 06/2019 was normal. Will check below labs today. Treatment pending results.  -     Thyroid Panel With TSH -     VITAMIN D 25 Hydroxy (Vit-D Deficiency, Fractures) -     CBC with Differential/Platelet -     CMP14+EGFR -     Thyroid antibodies  Pain in left foot Imaging concerning for fractures of 4th and 5th metatarsals. Will await radiology reading. Supportive shoe discussed in detail. Symptomatic care discussed in detail.  -     DG Foot Complete Left  Screening for breast cancer Mammogram ordered.    Continue all other maintenance medications.  Follow up plan: Return in about 3 months (around 02/18/2021), or if symptoms worsen or fail to improve, for CPE.   Continue healthy lifestyle  choices, including  diet (rich in fruits, vegetables, and lean proteins, and low in salt and simple carbohydrates) and exercise (at least 30 minutes of moderate physical activity daily).   The above assessment and management plan was discussed with the patient. The patient verbalized understanding of and has agreed to the management plan. Patient is aware to call the clinic if they develop any new symptoms or if symptoms persist or worsen. Patient is aware when to return to the clinic for a follow-up visit. Patient educated on when it is appropriate to go to the emergency department.   Monia Pouch, FNP-C [facility] [phone]

## 2020-11-19 LAB — CMP14+EGFR
ALT: 14 IU/L (ref 0–32)
AST: 19 IU/L (ref 0–40)
Albumin/Globulin Ratio: 1.7 (ref 1.2–2.2)
Albumin: 4.3 g/dL (ref 3.8–4.8)
Alkaline Phosphatase: 74 IU/L (ref 44–121)
BUN/Creatinine Ratio: 14 (ref 9–23)
BUN: 12 mg/dL (ref 6–24)
Bilirubin Total: <0.2 mg/dL (ref 0.0–1.2)
CO2: 21 mmol/L (ref 20–29)
Calcium: 9.3 mg/dL (ref 8.7–10.2)
Chloride: 100 mmol/L (ref 96–106)
Creatinine, Ser: 0.83 mg/dL (ref 0.57–1.00)
Globulin, Total: 2.6 g/dL (ref 1.5–4.5)
Glucose: 85 mg/dL (ref 65–99)
Potassium: 5 mmol/L (ref 3.5–5.2)
Sodium: 137 mmol/L (ref 134–144)
Total Protein: 6.9 g/dL (ref 6.0–8.5)
eGFR: 88 mL/min/{1.73_m2} (ref >59)

## 2020-11-19 LAB — THYROID ANTIBODIES
Thyroglobulin Antibody: <1 IU/mL (ref 0.0–0.9)
Thyroperoxidase Ab SerPl-aCnc: <8 IU/mL (ref 0–34)

## 2020-11-19 LAB — CBC WITH DIFFERENTIAL/PLATELET
Basophils Absolute: 0.1 10*3/uL (ref 0.0–0.2)
Basos: 1 %
EOS (ABSOLUTE): 0.2 10*3/uL (ref 0.0–0.4)
Eos: 2 %
Hematocrit: 38.7 % (ref 34.0–46.6)
Hemoglobin: 12.5 g/dL (ref 11.1–15.9)
Immature Grans (Abs): 0 10*3/uL (ref 0.0–0.1)
Immature Granulocytes: 0 %
Lymphocytes Absolute: 2.4 10*3/uL (ref 0.7–3.1)
Lymphs: 28 %
MCH: 26.7 pg (ref 26.6–33.0)
MCHC: 32.3 g/dL (ref 31.5–35.7)
MCV: 83 fL (ref 79–97)
Monocytes Absolute: 0.6 10*3/uL (ref 0.1–0.9)
Monocytes: 7 %
Neutrophils Absolute: 5.4 10*3/uL (ref 1.4–7.0)
Neutrophils: 62 %
Platelets: 328 10*3/uL (ref 150–450)
RBC: 4.68 x10E6/uL (ref 3.77–5.28)
RDW: 15.6 % — ABNORMAL HIGH (ref 11.7–15.4)
WBC: 8.7 10*3/uL (ref 3.4–10.8)

## 2020-11-19 LAB — THYROID PANEL WITH TSH
Free Thyroxine Index: 1.6 (ref 1.2–4.9)
T3 Uptake Ratio: 24 % (ref 24–39)
T4, Total: 6.7 ug/dL (ref 4.5–12.0)
TSH: 1.94 u[IU]/mL (ref 0.450–4.500)

## 2020-11-19 LAB — VITAMIN D 25 HYDROXY (VIT D DEFICIENCY, FRACTURES): Vit D, 25-Hydroxy: 22 ng/mL — ABNORMAL LOW (ref 30.0–100.0)

## 2020-11-22 ENCOUNTER — Telehealth: Payer: Self-pay | Admitting: Family Medicine

## 2020-11-22 NOTE — Telephone Encounter (Signed)
See note from result/ xray

## 2020-11-22 NOTE — Telephone Encounter (Signed)
She needs to see podiatry for orthotics or [facility] and have inserts [facility].

## 2020-11-22 NOTE — Telephone Encounter (Signed)
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 Pt says she hasnt heard anything and really needs some advise soon.  Please advise and call patient.

## 2020-11-22 NOTE — Telephone Encounter (Signed)
Pt aware of recommendations

## 2020-11-22 NOTE — Progress Notes (Signed)
Good support shoes or inserts to help with symptoms.

## 2020-11-23 ENCOUNTER — Telehealth: Payer: Self-pay | Admitting: Family Medicine

## 2020-11-23 DIAGNOSIS — S92355A Nondisplaced fracture of fifth metatarsal bone, left foot, initial encounter for closed fracture: Secondary | ICD-10-CM | POA: Diagnosis not present

## 2020-11-23 NOTE — Telephone Encounter (Signed)
Did they get more images?  It did not show up on our films and radiology read the images as normal. I was concerned it was fractured, I am glad she was able to see ortho so soon.

## 2020-12-14 DIAGNOSIS — M79672 Pain in left foot: Secondary | ICD-10-CM | POA: Diagnosis not present

## 2020-12-14 DIAGNOSIS — M84375A Stress fracture, left foot, initial encounter for fracture: Secondary | ICD-10-CM | POA: Diagnosis not present

## 2021-03-01 ENCOUNTER — Ambulatory Visit
Admission: RE | Admit: 2021-03-01 | Discharge: 2021-03-01 | Disposition: A | Discharge status: Home / Self Care | Payer: PRIVATE HEALTH INSURANCE | Source: Ambulatory Visit | Attending: Family Medicine | Admitting: Family Medicine

## 2021-03-01 ENCOUNTER — Encounter: Payer: BC Managed Care – PPO | Admitting: Family Medicine

## 2021-03-01 ENCOUNTER — Other Ambulatory Visit: Payer: Self-pay

## 2021-03-01 DIAGNOSIS — Z1231 Encounter for screening mammogram for malignant neoplasm of breast: Secondary | ICD-10-CM

## 2021-03-01 IMAGING — MG MM DIGITAL SCREENING BILAT W/ TOMO AND CAD
8 series · 8 of 24 positions shown · non-contrast
Comparison: Previous exam(s).

CLINICAL DATA: Screening.

EXAM:
DIGITAL SCREENING BILATERAL MAMMOGRAM WITH TOMOSYNTHESIS AND CAD
TECHNIQUE: Bilateral screening digital craniocaudal and mediolateral oblique
mammograms were obtained. Bilateral screening digital breast
tomosynthesis was performed. The images were evaluated with
computer-aided detection.

[R CC synth-2D]
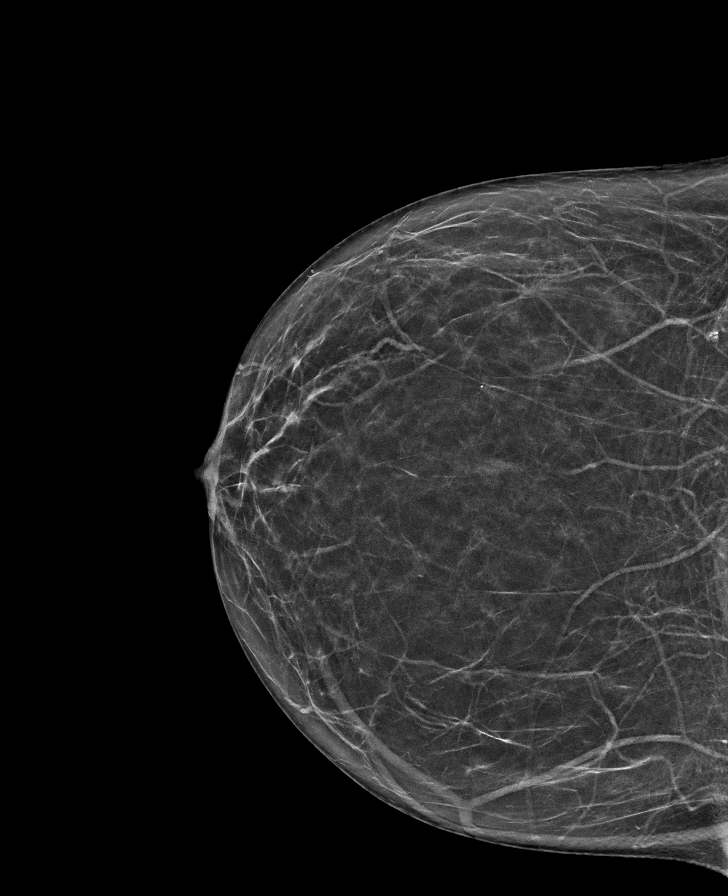

[L CC synth-2D]
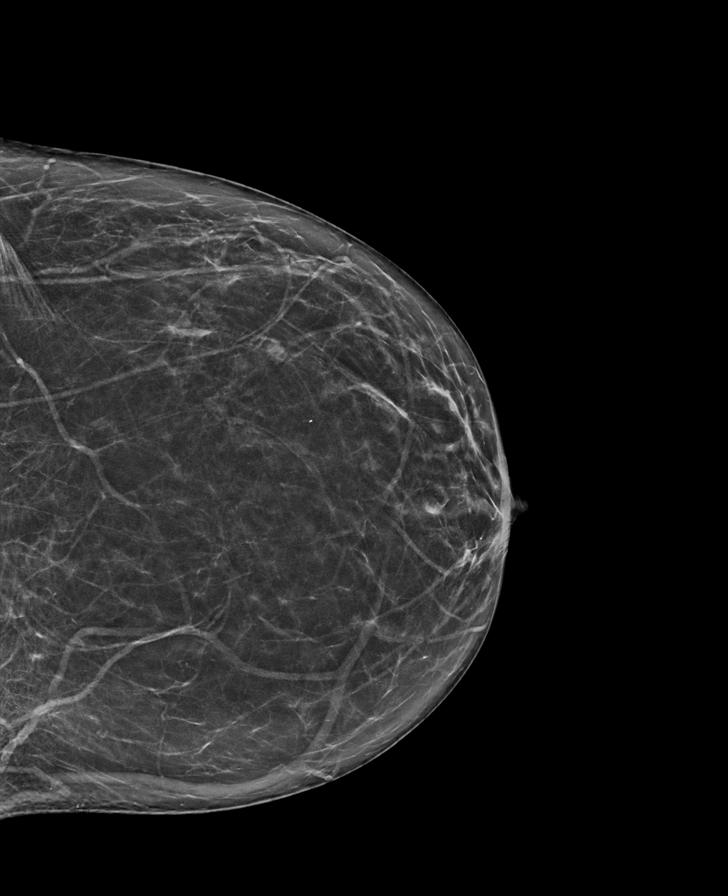

[L MLO synth-2D]
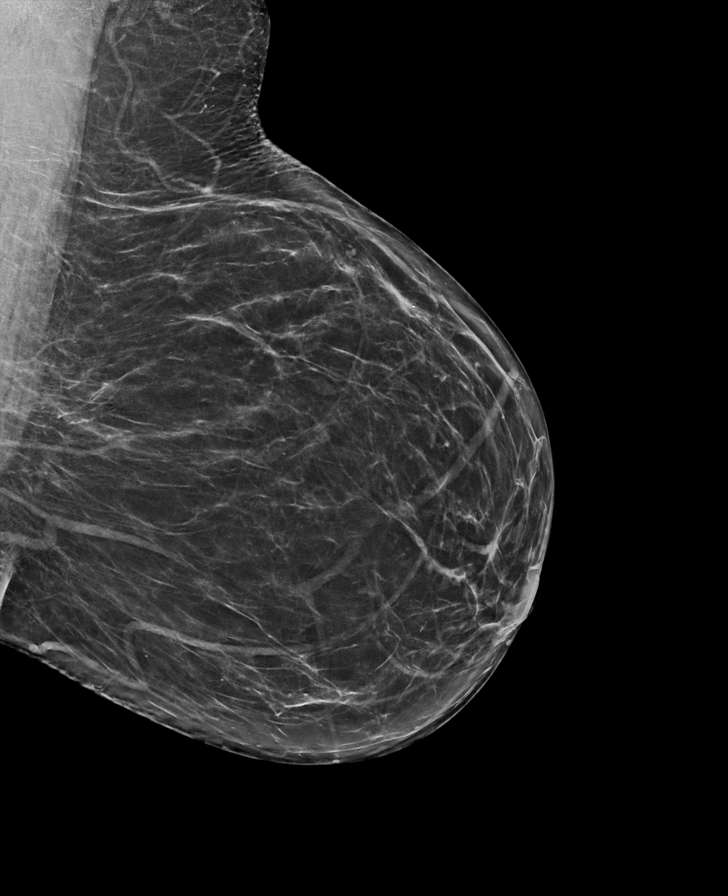

[R MLO synth-2D]
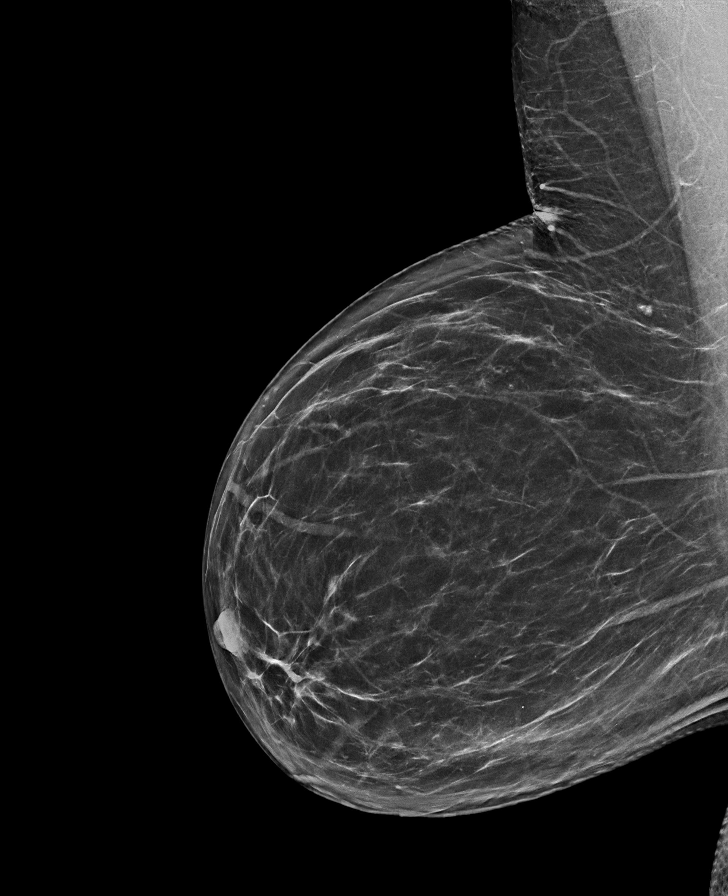

[L MLO tomo · tomo slice 38/75.0]
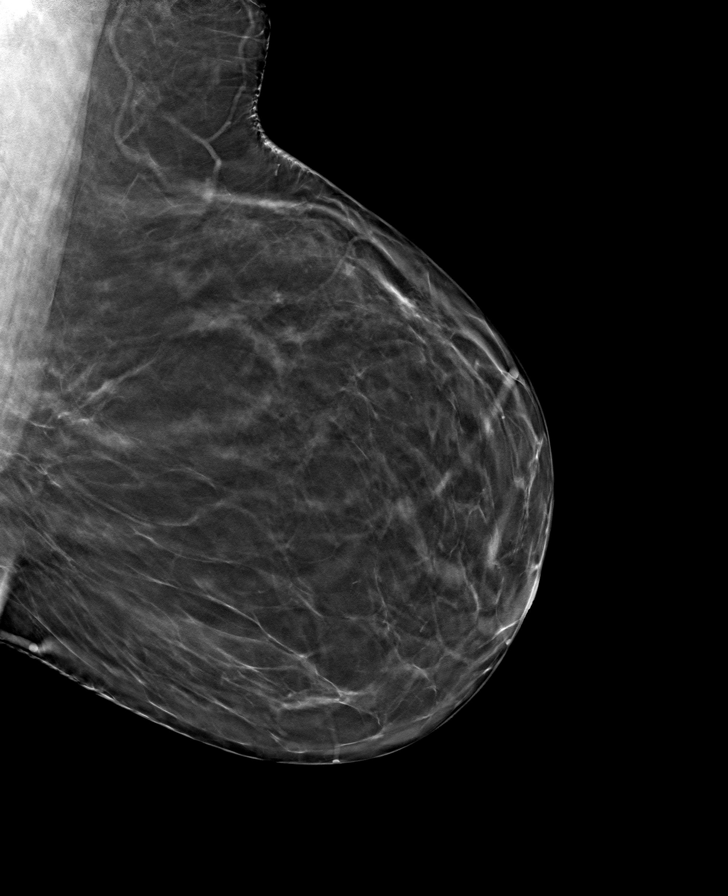

[L CC tomo · tomo slice 34/67.0]
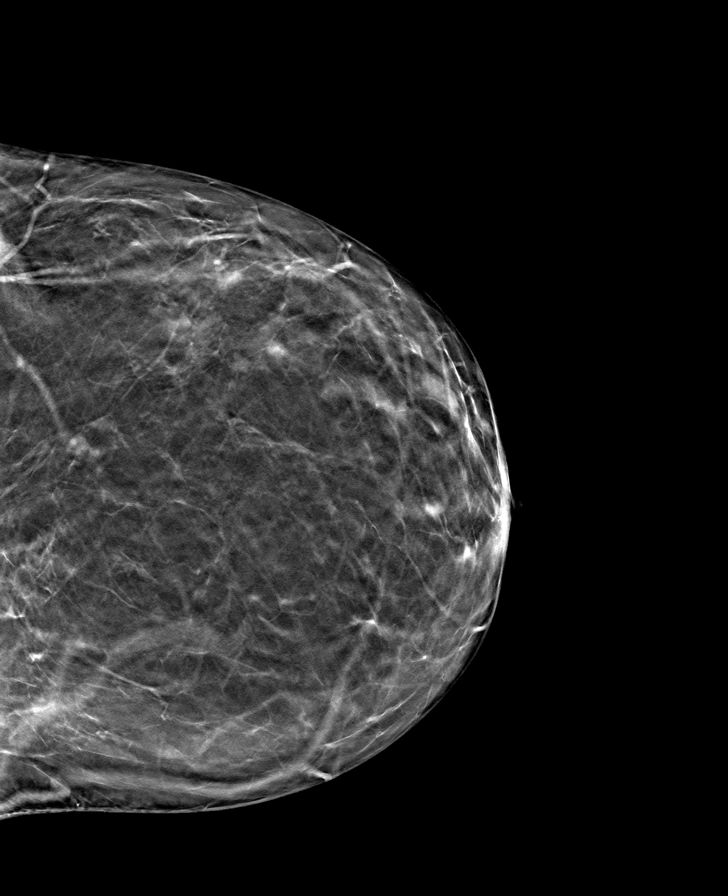

[R CC tomo · tomo slice 33/64.0]
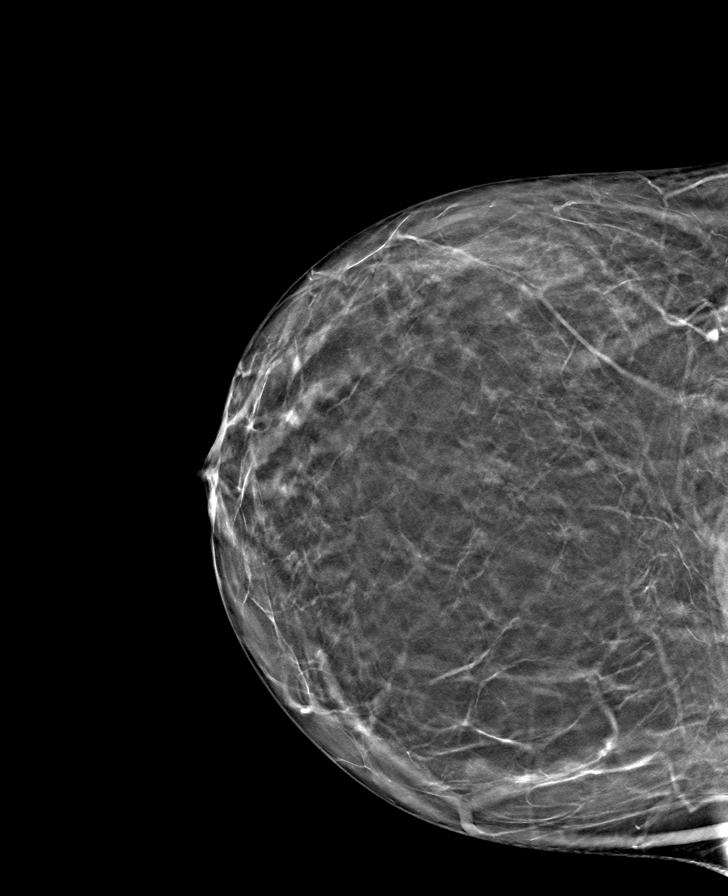

[R MLO tomo · tomo slice 38/75.0]
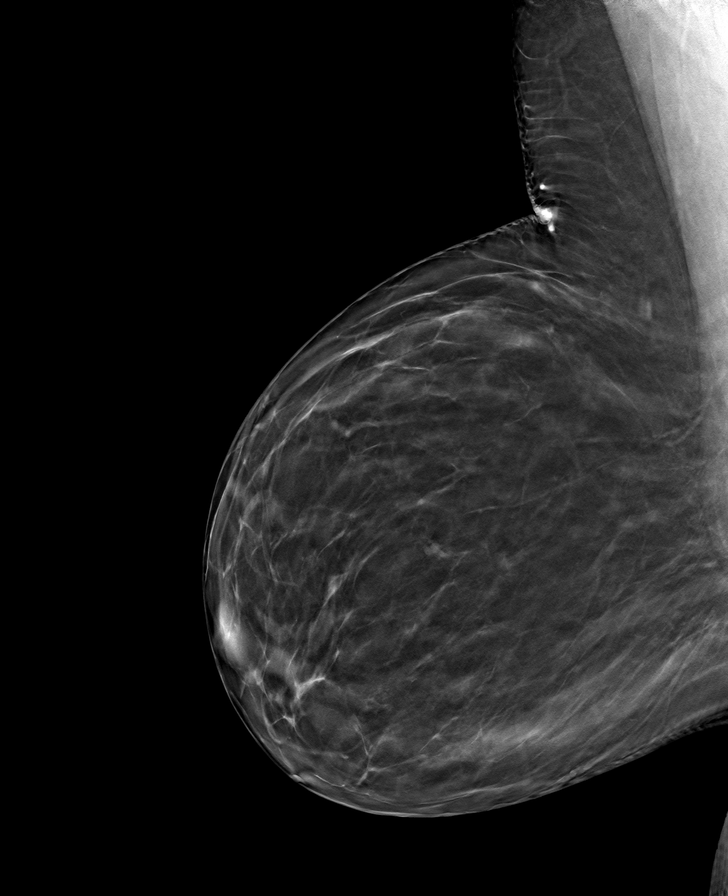

[8 of 24 positions shown; findings below may reference images not displayed]

ACR Breast Density Category b: There are scattered areas of
fibroglandular density.
FINDINGS: There are no findings suspicious for malignancy.
IMPRESSION: No mammographic evidence of malignancy. A result letter of this
screening mammogram will be mailed directly to the patient.

RECOMMENDATION:
Screening mammogram in one year. (Code:[BY])

BI-RADS CATEGORY  1: Negative.

## 2021-03-09 ENCOUNTER — Other Ambulatory Visit: Payer: Self-pay

## 2021-03-09 ENCOUNTER — Ambulatory Visit (INDEPENDENT_AMBULATORY_CARE_PROVIDER_SITE_OTHER): Payer: BC Managed Care – PPO | Admitting: Family Medicine

## 2021-03-09 ENCOUNTER — Encounter: Payer: Self-pay | Admitting: Family Medicine

## 2021-03-09 VITALS — BP 122/86 | HR 69 | Temp 98.1°F | Ht 70.0 in | Wt 230.0 lb

## 2021-03-09 DIAGNOSIS — Z1159 Encounter for screening for other viral diseases: Secondary | ICD-10-CM | POA: Diagnosis not present

## 2021-03-09 DIAGNOSIS — Z0001 Encounter for general adult medical examination with abnormal findings: Secondary | ICD-10-CM

## 2021-03-09 DIAGNOSIS — E559 Vitamin D deficiency, unspecified: Secondary | ICD-10-CM | POA: Diagnosis not present

## 2021-03-09 DIAGNOSIS — Z1329 Encounter for screening for other suspected endocrine disorder: Secondary | ICD-10-CM | POA: Diagnosis not present

## 2021-03-09 DIAGNOSIS — Z0000 Encounter for general adult medical examination without abnormal findings: Secondary | ICD-10-CM | POA: Diagnosis not present

## 2021-03-09 DIAGNOSIS — H6593 Unspecified nonsuppurative otitis media, bilateral: Secondary | ICD-10-CM

## 2021-03-09 DIAGNOSIS — E7800 Pure hypercholesterolemia, unspecified: Secondary | ICD-10-CM

## 2021-03-09 DIAGNOSIS — Z1211 Encounter for screening for malignant neoplasm of colon: Secondary | ICD-10-CM

## 2021-03-09 DIAGNOSIS — Z2821 Immunization not carried out because of patient refusal: Secondary | ICD-10-CM

## 2021-03-09 DIAGNOSIS — Z1212 Encounter for screening for malignant neoplasm of rectum: Secondary | ICD-10-CM

## 2021-03-09 MED ORDER — FLUTICASONE PROPIONATE 50 MCG/ACT NA SUSP: 2.0000 | Freq: Every day | NASAL | 6 refills | Status: AC

## 2021-03-09 NOTE — Progress Notes (Signed)
Subjective:  [name], female    DOB: 11/07/1975, 45 y.o.   MRN: [id number]  Patient Care Team: Baruch Gouty, FNP as PCP - General (Family Medicine)   Chief Complaint:  Annual Exam   HPI: Emily Boone is a 45 y.o. female presenting on 03/09/2021 for Annual Exam   Patient presents today for annual physical exam without PAP.  She recently had her mammogram which was normal.  She has not had her colonoscopy and would like a referral today.  States she has been doing fairly well over the last several months.  She does try to watch her diet but has not been able to lose any weight.  She describes her eating habits as light snacks throughout the day and then a meal in the evenings.  She states she walks at least 9 miles per day at work but does not exercise outside of work.  She declines all vaccinations today. She does report fullness in the ears without pain.  She does have vitamin D deficiency and is on repletion therapy.  She has not been taking her cholesterol medicines in several months.    Relevant past medical, surgical, family, and social history reviewed and updated as indicated.  Allergies and medications reviewed and updated. Data reviewed: Chart in Epic.   Past Medical History:  Diagnosis Date   Anxiety    Depression    GERD (gastroesophageal reflux disease)    History of kidney stones    PTSD (post-traumatic stress disorder)     Past Surgical History:  Procedure Laterality Date   CHOLECYSTECTOMY     EPIGASTRIC HERNIA REPAIR N/A 12/14/2016   Procedure: OPEN REPAIR OF EPIGASTRIC HERNIA ;  Surgeon: Clovis Riley, MD;  Location: [facility];  Service: General;  Laterality: N/A;   HERNIA REPAIR     INSERTION OF MESH N/A 12/14/2016   Procedure: INSERTION OF MESH;  Surgeon: Clovis Riley, MD;  Location: [facility];  Service: General;  Laterality: N/A;   KNEE ARTHROSCOPY Right    TUBAL LIGATION     UMBILICAL HERNIA REPAIR N/A 12/14/2016   Procedure:  OPEN REPAIR OF UMBILICAL HERNIA;  Surgeon: Clovis Riley, MD;  Location: [facility];  Service: General;  Laterality: N/A;    Social History   Socioeconomic History   Marital status: Married    Spouse name: Not on file   Number of children: Not on file   Years of education: Not on file   Highest education level: Not on file  Occupational History   Not on file  Tobacco Use   Smoking status: Former    Packs/day: 0.10    Years: 17.00    Pack years: 1.70    Types: Cigarettes    Quit date: 09/01/2015    Years since quitting: 5.5   Smokeless tobacco: Never  Vaping Use   Vaping Use: Former  Substance and Sexual Activity   Alcohol use: No   Drug use: No   Sexual activity: Not on file  Other Topics Concern   Not on file  Social History Narrative   Not on file   Social Determinants of Health   Financial Resource Strain: Not on file  Food Insecurity: Not on file  Transportation Needs: Not on file  Physical Activity: Not on file  Stress: Not on file  Social Connections: Not on file  Intimate Partner Violence: Not on file    Outpatient Encounter Medications as of 03/09/2021  Medication  Sig   fluticasone (FLONASE) 50 MCG/ACT nasal spray Place 2 sprays into both nostrils daily.   omeprazole (PRILOSEC) 20 MG capsule Take 20 mg by mouth 2 (two) times daily before a meal.   [DISCONTINUED] atorvastatin (LIPITOR) 20 MG tablet Take 1 tablet (20 mg total) by mouth daily. (Patient not taking: Reported on 11/18/2020)   [DISCONTINUED] fluticasone (FLONASE) 50 MCG/ACT nasal spray Place 2 sprays into both nostrils daily. (Patient not taking: Reported on 11/18/2020)   [DISCONTINUED] gabapentin (NEURONTIN) 300 MG/6ML solution Take 2 mLs (100 mg total) by mouth 3 (three) times daily. (Patient not taking: Reported on 11/18/2020)   No facility-administered encounter medications on file as of 03/09/2021.    Allergies  Allergen Reactions   Clindamycin/Lincomycin Other (See Comments)    "stuck in  throat" cannot take pill form; can take as liquid   Penicillins Swelling and Rash    Childhood reaction and family history  Has patient had a PCN reaction causing immediate rash, facial/tongue/throat swelling, SOB or lightheadedness with hypotension: Unknown Has patient had a PCN reaction causing severe rash involving mucus membranes or skin necrosis: Unknown Has patient had a PCN reaction that required hospitalization: Unknown Has patient had a PCN reaction occurring within the last 10 years: No If all of the above answers are "NO", then may proceed with Cephalosporin use.     Review of Systems  Constitutional:  Negative for activity change, appetite change, chills, diaphoresis, fatigue, fever and unexpected weight change.  HENT:  Positive for ear pain.   Eyes: Negative.  Negative for photophobia and visual disturbance.  Respiratory:  Negative for cough, chest tightness and shortness of breath.   Cardiovascular:  Negative for chest pain, palpitations and leg swelling.  Gastrointestinal:  Negative for abdominal pain, blood in stool, constipation, diarrhea, nausea, rectal pain and vomiting.  Endocrine: Negative.  Negative for cold intolerance, heat intolerance, polydipsia, polyphagia and polyuria.  Genitourinary:  Negative for decreased urine volume, difficulty urinating, dysuria, frequency and urgency.  Musculoskeletal:  Positive for arthralgias, back pain and myalgias. Negative for gait problem, joint swelling, neck pain and neck stiffness.  Skin: Negative.   Allergic/Immunologic: Negative.   Neurological:  Negative for dizziness, tremors, seizures, syncope, facial asymmetry, speech difficulty, weakness, light-headedness, numbness and headaches.  Hematological: Negative.   Psychiatric/Behavioral:  Negative for confusion, hallucinations, sleep disturbance and suicidal ideas.   All other systems reviewed and are negative.      Objective:  BP 122/86   Pulse 69   Temp 98.1 F (36.7 C)    Ht _0  (1.778 m)   Wt 230 lb (104.3 kg)   LMP 03/06/2021 (Exact Date)   SpO2 97%   BMI 33.00 kg/m    Wt Readings from Last 3 Encounters:  03/09/21 230 lb (104.3 kg)  11/18/20 227 lb (103 kg)  06/18/19 233 lb (105.7 kg)    Physical Exam Vitals and nursing note reviewed.  Constitutional:      General: She is not in acute distress.    Appearance: Normal appearance. She is well-developed and well-groomed. She is obese. She is not ill-appearing, toxic-appearing or diaphoretic.  HENT:     Head: Normocephalic and atraumatic.     Jaw: There is normal jaw occlusion.     Right Ear: Hearing, ear canal and external ear normal. A middle ear effusion is present. Tympanic membrane is not perforated or erythematous.     Left Ear: Hearing, ear canal and external ear normal. A middle ear effusion is present.  Tympanic membrane is not perforated or erythematous.     Nose: Nose normal.     Mouth/Throat:     Lips: Pink.     Mouth: Mucous membranes are moist.     Dentition: Dental caries present.     Pharynx: Oropharynx is clear. Uvula midline.  Eyes:     General: Lids are normal.     Extraocular Movements: Extraocular movements intact.     Conjunctiva/sclera: Conjunctivae normal.     Pupils: Pupils are equal, round, and reactive to light.  Neck:     Thyroid: No thyroid mass, thyromegaly or thyroid tenderness.     Vascular: No carotid bruit or JVD.     Trachea: Trachea and phonation normal.  Cardiovascular:     Rate and Rhythm: Normal rate and regular rhythm.     Chest Wall: PMI is not displaced.     Pulses: Normal pulses.     Heart sounds: Normal heart sounds. No murmur heard.   No friction rub. No gallop.  Pulmonary:     Effort: Pulmonary effort is normal. No respiratory distress.     Breath sounds: Normal breath sounds. No wheezing.  Abdominal:     General: Bowel sounds are normal. There is no distension or abdominal bruit.     Palpations: Abdomen is soft. There is no hepatomegaly  or splenomegaly.     Tenderness: There is no abdominal tenderness. There is no right CVA tenderness or left CVA tenderness.     Hernia: No hernia is present.  Genitourinary:    Comments: Deferred Musculoskeletal:        General: Normal range of motion.     Cervical back: Normal range of motion and neck supple.     Right lower leg: No edema.     Left lower leg: No edema.  Lymphadenopathy:     Cervical: No cervical adenopathy.  Skin:    General: Skin is warm and dry.     Capillary Refill: Capillary refill takes less than 2 seconds.     Coloration: Skin is not cyanotic, jaundiced or pale.     Findings: No rash.  Neurological:     General: No focal deficit present.     Mental Status: She is alert and oriented to person, place, and time.     Sensory: Sensation is intact.     Motor: Motor function is intact.     Coordination: Coordination is intact.     Gait: Gait is intact.     Deep Tendon Reflexes: Reflexes are normal and symmetric.  Psychiatric:        Attention and Perception: Attention and perception normal.        Mood and Affect: Mood and affect normal.        Speech: Speech normal.        Behavior: Behavior normal. Behavior is cooperative.        Thought Content: Thought content normal.        Cognition and Memory: Cognition and memory normal.        Judgment: Judgment normal.    Results for orders placed or performed in visit on 11/18/20  Thyroid Panel With TSH  Result Value Ref Range   TSH 1.940 0.450 - 4.500 uIU/mL   T4, Total 6.7 4.5 - 12.0 ug/dL   T3 Uptake Ratio 24 24 - 39 %   Free Thyroxine Index 1.6 1.2 - 4.9  VITAMIN D 25 Hydroxy (Vit-D Deficiency, Fractures)  Result Value Ref Range  Vit D, 25-Hydroxy 22.0 (L) 30.0 - 100.0 ng/mL  CBC with Differential/Platelet  Result Value Ref Range   WBC 8.7 3.4 - 10.8 x10E3/uL   RBC 4.68 3.77 - 5.28 x10E6/uL   Hemoglobin 12.5 11.1 - 15.9 g/dL   Hematocrit 38.7 34.0 - 46.6 %   MCV 83 79 - 97 fL   MCH 26.7 26.6 - 33.0  pg   MCHC 32.3 31.5 - 35.7 g/dL   RDW 15.6 (H) 11.7 - 15.4 %   Platelets 328 150 - 450 x10E3/uL   Neutrophils 62 Not Estab. %   Lymphs 28 Not Estab. %   Monocytes 7 Not Estab. %   Eos 2 Not Estab. %   Basos 1 Not Estab. %   Neutrophils Absolute 5.4 1.4 - 7.0 x10E3/uL   Lymphocytes Absolute 2.4 0.7 - 3.1 x10E3/uL   Monocytes Absolute 0.6 0.1 - 0.9 x10E3/uL   EOS (ABSOLUTE) 0.2 0.0 - 0.4 x10E3/uL   Basophils Absolute 0.1 0.0 - 0.2 x10E3/uL   Immature Granulocytes 0 Not Estab. %   Immature Grans (Abs) 0.0 0.0 - 0.1 x10E3/uL  CMP14+EGFR  Result Value Ref Range   Glucose 85 65 - 99 mg/dL   BUN 12 6 - 24 mg/dL   Creatinine, Ser 0.83 0.57 - 1.00 mg/dL   eGFR 88 >59 mL/min/1.73   BUN/Creatinine Ratio 14 9 - 23   Sodium 137 134 - 144 mmol/L   Potassium 5.0 3.5 - 5.2 mmol/L   Chloride 100 96 - 106 mmol/L   CO2 21 20 - 29 mmol/L   Calcium 9.3 8.7 - 10.2 mg/dL   Total Protein 6.9 6.0 - 8.5 g/dL   Albumin 4.3 3.8 - 4.8 g/dL   Globulin, Total 2.6 1.5 - 4.5 g/dL   Albumin/Globulin Ratio 1.7 1.2 - 2.2   Bilirubin Total <0.2 0.0 - 1.2 mg/dL   Alkaline Phosphatase 74 44 - 121 IU/L   AST 19 0 - 40 IU/L   ALT 14 0 - 32 IU/L  Thyroid antibodies  Result Value Ref Range   Thyroperoxidase Ab SerPl-aCnc <8 0 - 34 IU/mL   Thyroglobulin Antibody <1.0 0.0 - 0.9 IU/mL       Pertinent labs & imaging results that were available during my care of the patient were reviewed by me and considered in my medical decision making.  Assessment & Plan:  Emily Boone was seen today for annual exam.  Diagnoses and all orders for this visit:  Annual physical exam Health maintenance along with diet and exercise discussed in detail. Labs pending. Referred for colonoscopy. Declined all vaccinations today.  -     CBC with Differential/Platelet -     CMP14+EGFR -     Lipid panel -     Thyroid Panel With TSH -     Hepatitis C antibody -     Ambulatory referral to Gastroenterology  Encounter for hepatitis C  screening test for low risk patient -     Hepatitis C antibody  Fluid level behind tympanic membrane of both ears Continue flonase.  -     fluticasone (FLONASE) 50 MCG/ACT nasal spray; Place 2 sprays into both nostrils daily.  Screening for thyroid disorder -     Thyroid Panel With TSH  Screening for colorectal cancer -     Ambulatory referral to Gastroenterology  Vaccination declined Declined all vaccinations today.   Vitamin D deficiency On repletion therapy. Will check levels today.  -     VITAMIN D 25 Hydroxy (  Vit-D Deficiency, Fractures)    Continue all other maintenance medications.  Follow up plan: Return in about 6 months (around 09/07/2021), or if symptoms worsen or fail to improve.   Continue healthy lifestyle choices, including diet (rich in fruits, vegetables, and lean proteins, and low in salt and simple carbohydrates) and exercise (at least 30 minutes of moderate physical activity daily).  Educational handout given for health maintenance   The above assessment and management plan was discussed with the patient. The patient verbalized understanding of and has agreed to the management plan. Patient is aware to call the clinic if they develop any new symptoms or if symptoms persist or worsen. Patient is aware when to return to the clinic for a follow-up visit. Patient educated on when it is appropriate to go to the emergency department.   Monia Pouch, FNP-C [facility] [phone]

## 2021-03-10 LAB — CBC WITH DIFFERENTIAL/PLATELET
Basophils Absolute: 0.1 10*3/uL (ref 0.0–0.2)
Basos: 1 %
EOS (ABSOLUTE): 0.2 10*3/uL (ref 0.0–0.4)
Eos: 2 %
Hematocrit: 35.9 % (ref 34.0–46.6)
Hemoglobin: 11.9 g/dL (ref 11.1–15.9)
Immature Grans (Abs): 0 10*3/uL (ref 0.0–0.1)
Immature Granulocytes: 0 %
Lymphocytes Absolute: 2.1 10*3/uL (ref 0.7–3.1)
Lymphs: 28 %
MCH: 27.2 pg (ref 26.6–33.0)
MCHC: 33.1 g/dL (ref 31.5–35.7)
MCV: 82 fL (ref 79–97)
Monocytes Absolute: 0.7 10*3/uL (ref 0.1–0.9)
Monocytes: 9 %
Neutrophils Absolute: 4.8 10*3/uL (ref 1.4–7.0)
Neutrophils: 60 %
Platelets: 323 10*3/uL (ref 150–450)
RBC: 4.38 x10E6/uL (ref 3.77–5.28)
RDW: 14.2 % (ref 11.7–15.4)
WBC: 7.8 10*3/uL (ref 3.4–10.8)

## 2021-03-10 LAB — CMP14+EGFR
ALT: 12 IU/L (ref 0–32)
AST: 17 IU/L (ref 0–40)
Albumin/Globulin Ratio: 1.7 (ref 1.2–2.2)
Albumin: 4.2 g/dL (ref 3.8–4.8)
Alkaline Phosphatase: 70 IU/L (ref 44–121)
BUN/Creatinine Ratio: 9 (ref 9–23)
BUN: 8 mg/dL (ref 6–24)
Bilirubin Total: <0.2 mg/dL (ref 0.0–1.2)
CO2: 24 mmol/L (ref 20–29)
Calcium: 9.6 mg/dL (ref 8.7–10.2)
Chloride: 103 mmol/L (ref 96–106)
Creatinine, Ser: 0.88 mg/dL (ref 0.57–1.00)
Globulin, Total: 2.5 g/dL (ref 1.5–4.5)
Glucose: 91 mg/dL (ref 70–99)
Potassium: 5 mmol/L (ref 3.5–5.2)
Sodium: 139 mmol/L (ref 134–144)
Total Protein: 6.7 g/dL (ref 6.0–8.5)
eGFR: 82 mL/min/{1.73_m2} (ref >59)

## 2021-03-10 LAB — THYROID PANEL WITH TSH
Free Thyroxine Index: 1.8 (ref 1.2–4.9)
T3 Uptake Ratio: 27 % (ref 24–39)
T4, Total: 6.8 ug/dL (ref 4.5–12.0)
TSH: 1.69 u[IU]/mL (ref 0.450–4.500)

## 2021-03-10 LAB — HEPATITIS C ANTIBODY: Hep C Virus Ab: <0.1 s/co ratio (ref 0.0–0.9)

## 2021-03-10 LAB — LIPID PANEL
Chol/HDL Ratio: 7.7 ratio — ABNORMAL HIGH (ref 0.0–4.4)
Cholesterol, Total: 291 mg/dL — ABNORMAL HIGH (ref 100–199)
HDL: 38 mg/dL — ABNORMAL LOW (ref >39)
LDL Chol Calc (NIH): 228 mg/dL — ABNORMAL HIGH (ref 0–99)
Triglycerides: 132 mg/dL (ref 0–149)
VLDL Cholesterol Cal: 25 mg/dL (ref 5–40)

## 2021-03-10 LAB — VITAMIN D 25 HYDROXY (VIT D DEFICIENCY, FRACTURES): Vit D, 25-Hydroxy: 25.2 ng/mL — ABNORMAL LOW (ref 30.0–100.0)

## 2021-03-10 MED ORDER — ATORVASTATIN CALCIUM 40 MG PO TABS
40.0000 mg | ORAL_TABLET | Freq: Every day | ORAL | 3 refills | Status: AC
Start: 2021-03-10 — End: ?

## 2021-03-10 NOTE — Addendum Note (Signed)
Addended by: Baruch Gouty on: 03/10/2021 07:56 AM   Modules accepted: Orders

## 2021-03-13 ENCOUNTER — Encounter: Payer: Self-pay | Admitting: Internal Medicine

## 2021-03-23 ENCOUNTER — Emergency Department (HOSPITAL_COMMUNITY): Payer: BC Managed Care – PPO

## 2021-03-23 ENCOUNTER — Observation Stay (HOSPITAL_COMMUNITY)
Admission: EM | Admit: 2021-03-23 | Discharge: 2021-03-24 | Disposition: A | Discharge status: Home / Self Care | Payer: BC Managed Care – PPO | Attending: Internal Medicine | Admitting: Internal Medicine

## 2021-03-23 ENCOUNTER — Observation Stay (HOSPITAL_COMMUNITY): Payer: BC Managed Care – PPO

## 2021-03-23 ENCOUNTER — Other Ambulatory Visit: Payer: Self-pay

## 2021-03-23 ENCOUNTER — Encounter (HOSPITAL_COMMUNITY): Payer: Self-pay | Admitting: Emergency Medicine

## 2021-03-23 DIAGNOSIS — R200 Anesthesia of skin: Secondary | ICD-10-CM | POA: Insufficient documentation

## 2021-03-23 DIAGNOSIS — M5412 Radiculopathy, cervical region: Secondary | ICD-10-CM | POA: Diagnosis present

## 2021-03-23 DIAGNOSIS — Y900 Blood alcohol level of less than 20 mg/100 ml: Secondary | ICD-10-CM | POA: Insufficient documentation

## 2021-03-23 DIAGNOSIS — R9431 Abnormal electrocardiogram [ECG] [EKG]: Secondary | ICD-10-CM | POA: Diagnosis not present

## 2021-03-23 DIAGNOSIS — I6522 Occlusion and stenosis of left carotid artery: Secondary | ICD-10-CM | POA: Diagnosis not present

## 2021-03-23 DIAGNOSIS — R519 Headache, unspecified: Secondary | ICD-10-CM | POA: Diagnosis not present

## 2021-03-23 DIAGNOSIS — K219 Gastro-esophageal reflux disease without esophagitis: Secondary | ICD-10-CM | POA: Diagnosis not present

## 2021-03-23 DIAGNOSIS — R0789 Other chest pain: Secondary | ICD-10-CM | POA: Diagnosis not present

## 2021-03-23 DIAGNOSIS — K029 Dental caries, unspecified: Secondary | ICD-10-CM | POA: Diagnosis not present

## 2021-03-23 DIAGNOSIS — R2990 Unspecified symptoms and signs involving the nervous system: Secondary | ICD-10-CM

## 2021-03-23 DIAGNOSIS — E7800 Pure hypercholesterolemia, unspecified: Secondary | ICD-10-CM

## 2021-03-23 DIAGNOSIS — R29898 Other symptoms and signs involving the musculoskeletal system: Secondary | ICD-10-CM

## 2021-03-23 DIAGNOSIS — R531 Weakness: Principal | ICD-10-CM | POA: Insufficient documentation

## 2021-03-23 DIAGNOSIS — Z87891 Personal history of nicotine dependence: Secondary | ICD-10-CM | POA: Insufficient documentation

## 2021-03-23 DIAGNOSIS — R609 Edema, unspecified: Secondary | ICD-10-CM

## 2021-03-23 DIAGNOSIS — G459 Transient cerebral ischemic attack, unspecified: Secondary | ICD-10-CM | POA: Diagnosis not present

## 2021-03-23 DIAGNOSIS — Z20822 Contact with and (suspected) exposure to covid-19: Secondary | ICD-10-CM | POA: Diagnosis not present

## 2021-03-23 DIAGNOSIS — G988 Other disorders of nervous system: Secondary | ICD-10-CM | POA: Diagnosis not present

## 2021-03-23 DIAGNOSIS — R29818 Other symptoms and signs involving the nervous system: Secondary | ICD-10-CM | POA: Diagnosis not present

## 2021-03-23 LAB — CBC
HCT: 35.4 % — ABNORMAL LOW (ref 36.0–46.0)
Hemoglobin: 11.2 g/dL — ABNORMAL LOW (ref 12.0–15.0)
MCH: 27.8 pg (ref 26.0–34.0)
MCHC: 31.6 g/dL (ref 30.0–36.0)
MCV: 87.8 fL (ref 80.0–100.0)
Platelets: 261 10*3/uL (ref 150–400)
RBC: 4.03 MIL/uL (ref 3.87–5.11)
RDW: 14.6 % (ref 11.5–15.5)
WBC: 10.2 10*3/uL (ref 4.0–10.5)
nRBC: 0 % (ref 0.0–0.2)

## 2021-03-23 LAB — URINALYSIS, MICROSCOPIC (REFLEX)
Bacteria, UA: NONE SEEN
Squamous Epithelial / HPF: NONE SEEN (ref 0–5)
WBC, UA: NONE SEEN WBC/hpf (ref 0–5)

## 2021-03-23 LAB — COMPREHENSIVE METABOLIC PANEL
ALT: 18 U/L (ref 0–44)
AST: 22 U/L (ref 15–41)
Albumin: 3.8 g/dL (ref 3.5–5.0)
Alkaline Phosphatase: 57 U/L (ref 38–126)
Anion gap: 8 (ref 5–15)
BUN: 9 mg/dL (ref 6–20)
CO2: 25 mmol/L (ref 22–32)
Calcium: 8.8 mg/dL — ABNORMAL LOW (ref 8.9–10.3)
Chloride: 104 mmol/L (ref 98–111)
Creatinine, Ser: 0.64 mg/dL (ref 0.44–1.00)
GFR, Estimated: >60 mL/min (ref >60)
Glucose, Bld: 90 mg/dL (ref 70–99)
Potassium: 4 mmol/L (ref 3.5–5.1)
Sodium: 137 mmol/L (ref 135–145)
Total Bilirubin: 0.4 mg/dL (ref 0.3–1.2)
Total Protein: 7.1 g/dL (ref 6.5–8.1)

## 2021-03-23 LAB — URINALYSIS, ROUTINE W REFLEX MICROSCOPIC
Bilirubin Urine: NEGATIVE
Glucose, UA: NEGATIVE mg/dL
Ketones, ur: NEGATIVE mg/dL
Leukocytes,Ua: NEGATIVE
Nitrite: NEGATIVE
Protein, ur: NEGATIVE mg/dL
Specific Gravity, Urine: 1.015 (ref 1.005–1.030)
pH: 6 (ref 5.0–8.0)

## 2021-03-23 LAB — TROPONIN I (HIGH SENSITIVITY)
Troponin I (High Sensitivity): 2 ng/L (ref <18)
Troponin I (High Sensitivity): 3 ng/L (ref <18)

## 2021-03-23 LAB — APTT: aPTT: 27 seconds (ref 24–36)

## 2021-03-23 LAB — RAPID URINE DRUG SCREEN, HOSP PERFORMED
Amphetamines: NOT DETECTED
Barbiturates: NOT DETECTED
Benzodiazepines: NOT DETECTED
Cocaine: NOT DETECTED
Opiates: NOT DETECTED
Tetrahydrocannabinol: NOT DETECTED

## 2021-03-23 LAB — PROTIME-INR
INR: 1 (ref 0.8–1.2)
Prothrombin Time: 12.8 seconds (ref 11.4–15.2)

## 2021-03-23 LAB — RESP PANEL BY RT-PCR (FLU A&B, COVID) ARPGX2
Influenza A by PCR: NEGATIVE
Influenza B by PCR: NEGATIVE
SARS Coronavirus 2 by RT PCR: NEGATIVE

## 2021-03-23 LAB — ETHANOL: Alcohol, Ethyl (B): <10 mg/dL (ref <10)

## 2021-03-23 IMAGING — CT CT HEAD W/O CM
3 series · 16 of 47 positions shown, 19 images · non-contrast
Comparison: None.

CLINICAL DATA: Headache, new or worsening (Age >= 50y)

EXAM:
CT HEAD WITHOUT CONTRAST
TECHNIQUE: Contiguous axial images were obtained from the base of the skull
through the vertex without intravenous contrast.

[Series 2: head w o · axial · 0.42mm/px · z∈[+4,+134]mm · 10 of 32 slices shown, 13 images]
[im 3/32  brain]
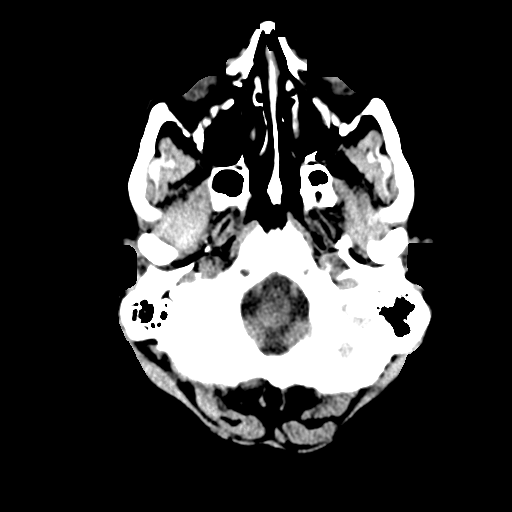
[im 3/32  bone]
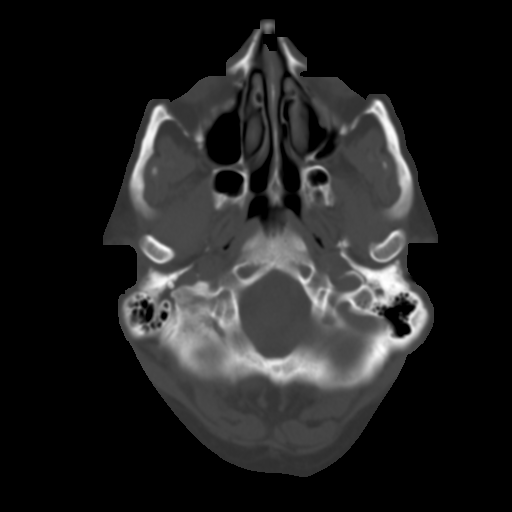
[im 6/32  brain]
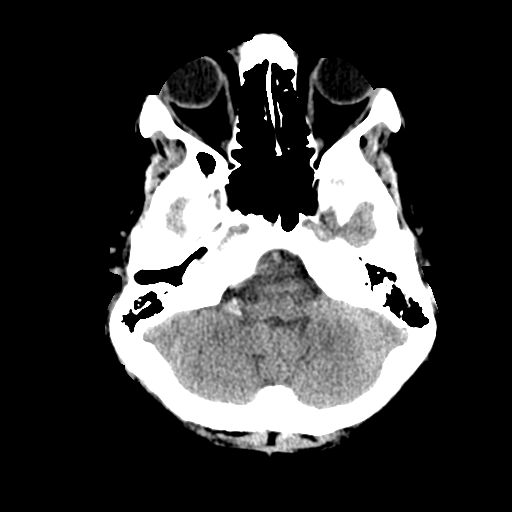
[im 9/32  brain]
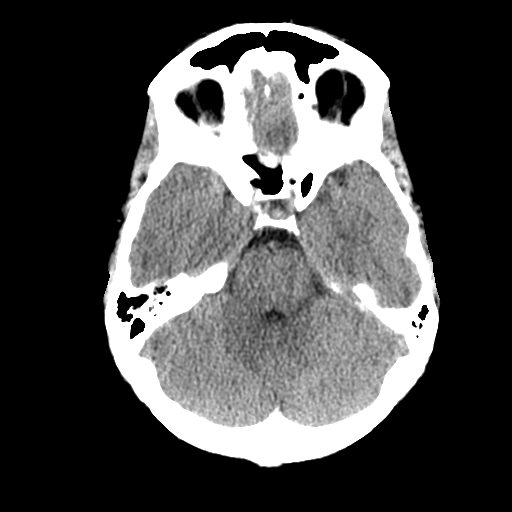
[im 11/32  brain]
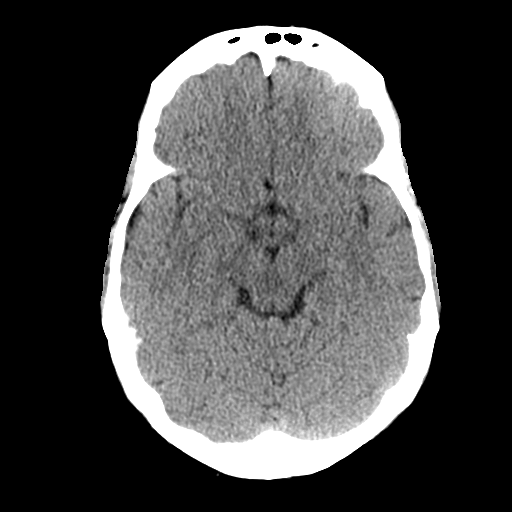
[im 14/32  brain]
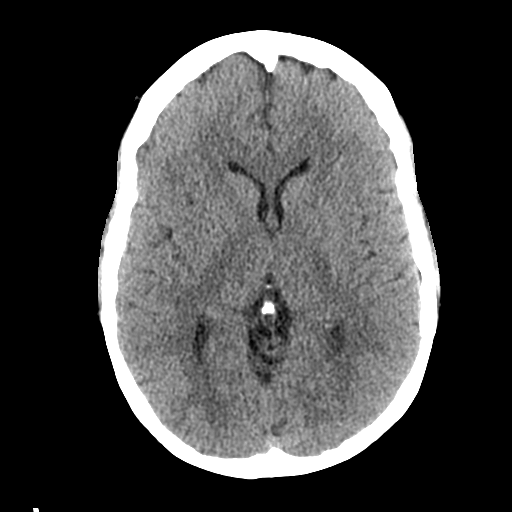
[im 14/32  bone]
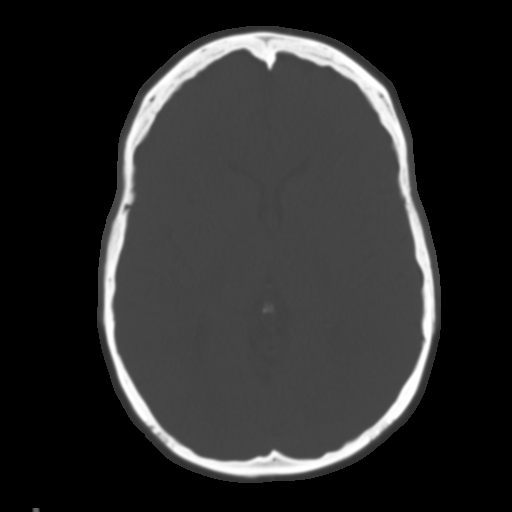
[im 18/32  brain]
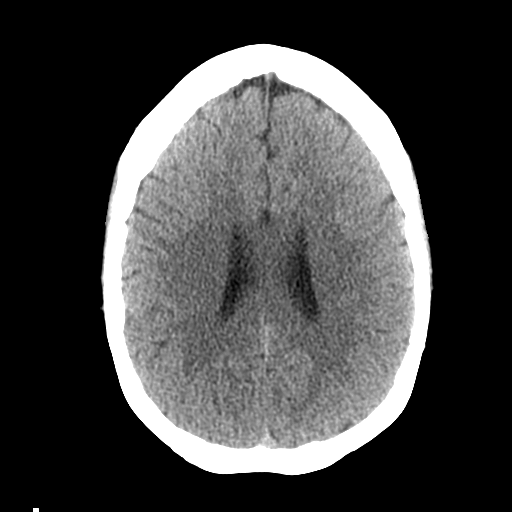
[im 21/32  brain]
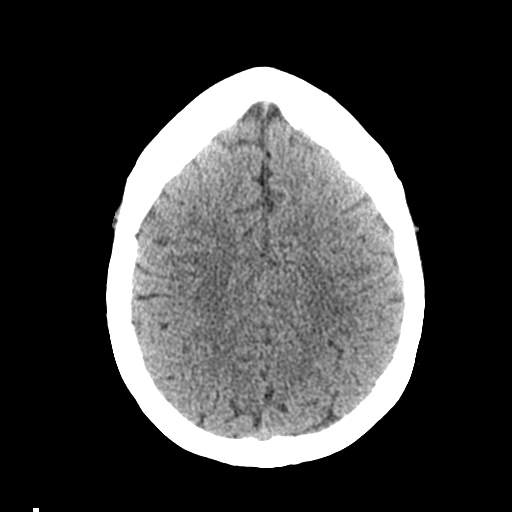
[im 24/32  brain]
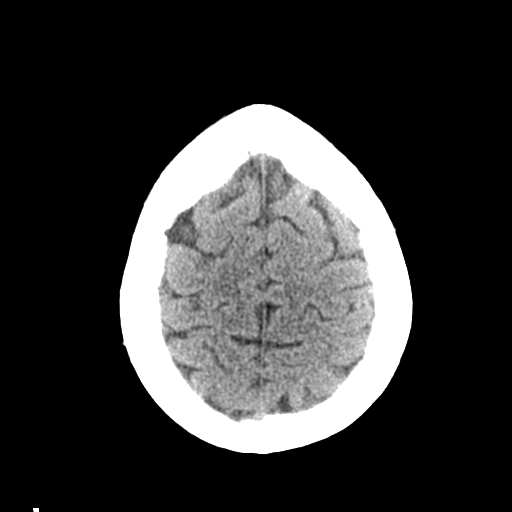
[im 26/32  brain]
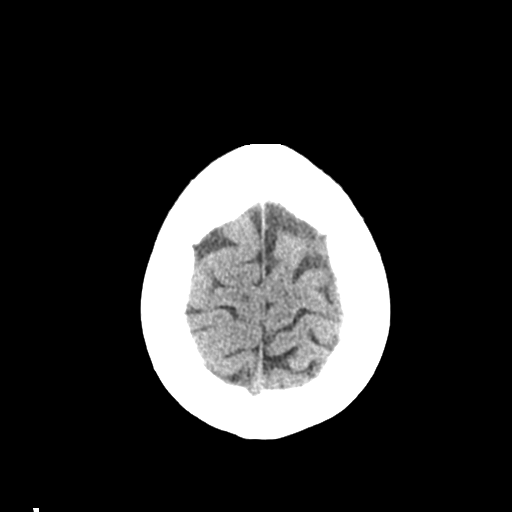
[im 26/32  bone]
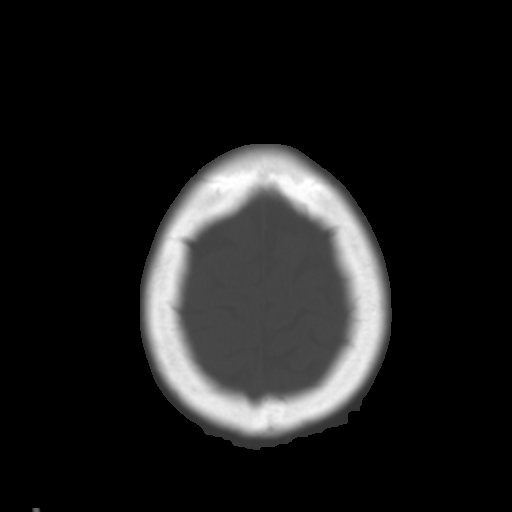
[im 29/32  brain]
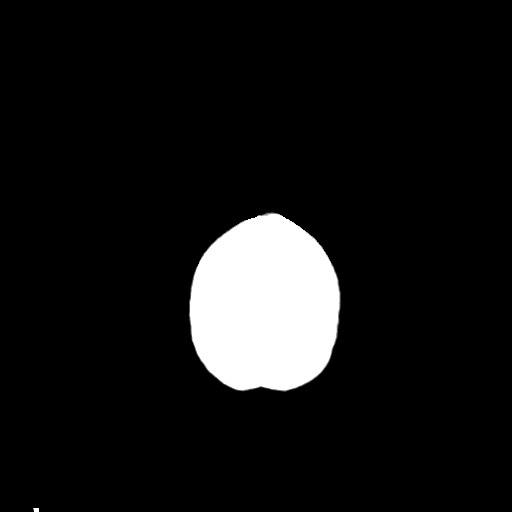

[Series 4: coronal soft · coronal · 0.33mm/px · 3 of 83 slices shown]
[im 28/83  brain]
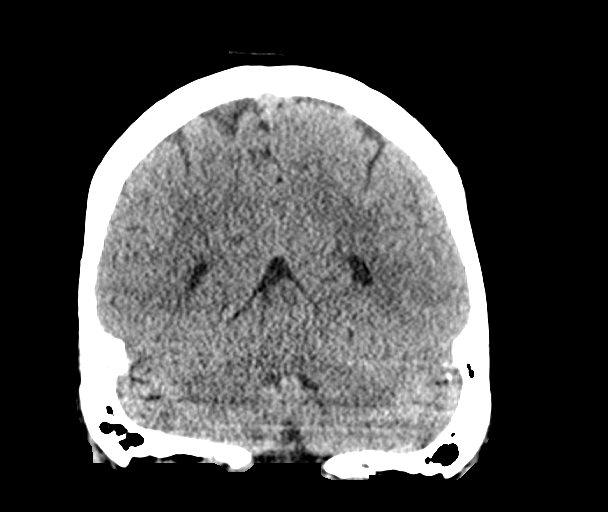
[im 37/83  brain]
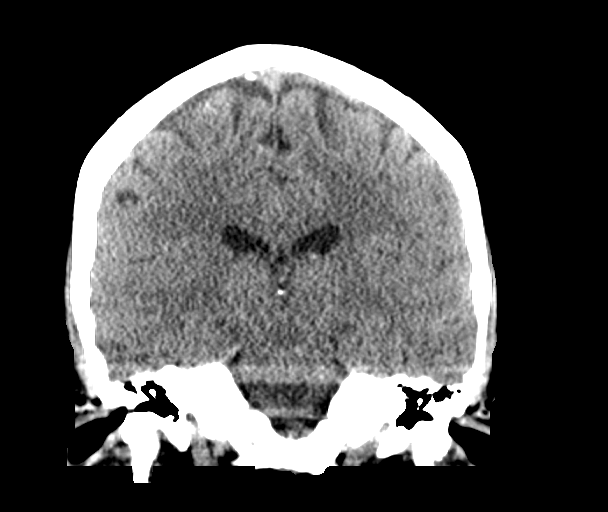
[im 46/83  brain]
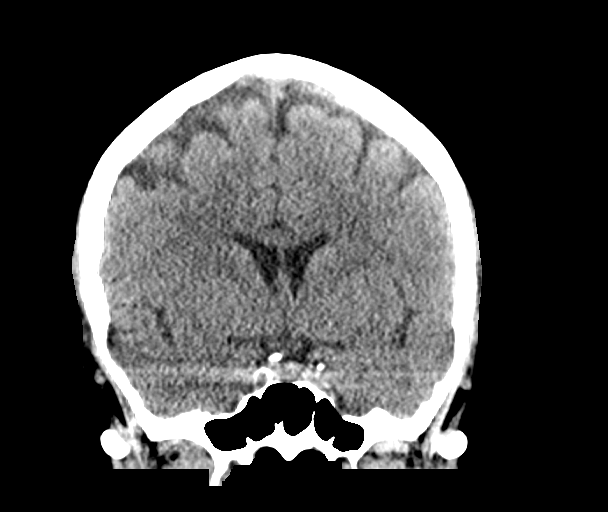

[Series 5: sagittal soft · sagittal · 0.38mm/px · 3 of 59 slices shown]
[im 20/59  brain]
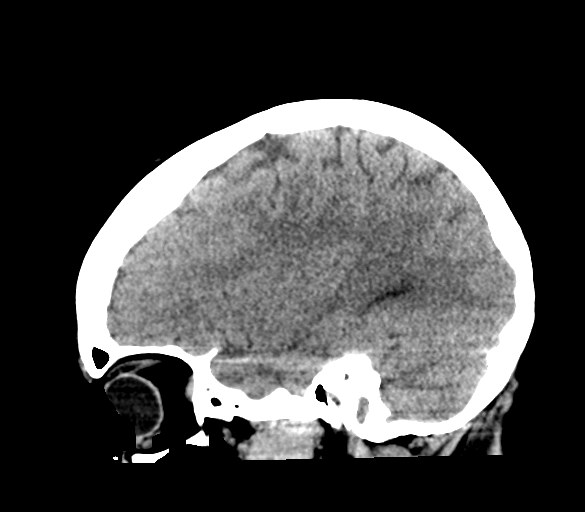
[im 30/59  brain]
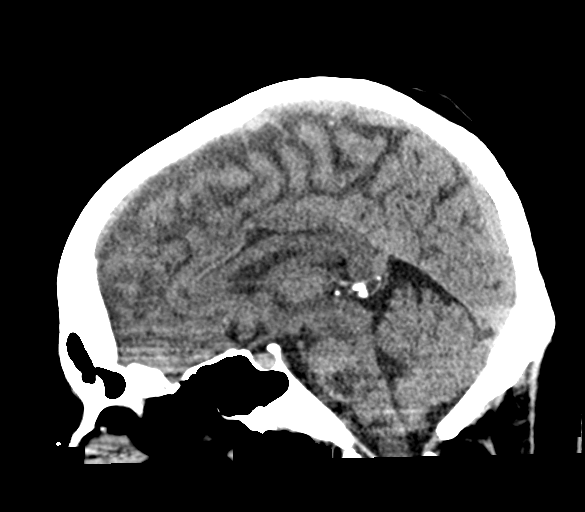
[im 39/59  brain]
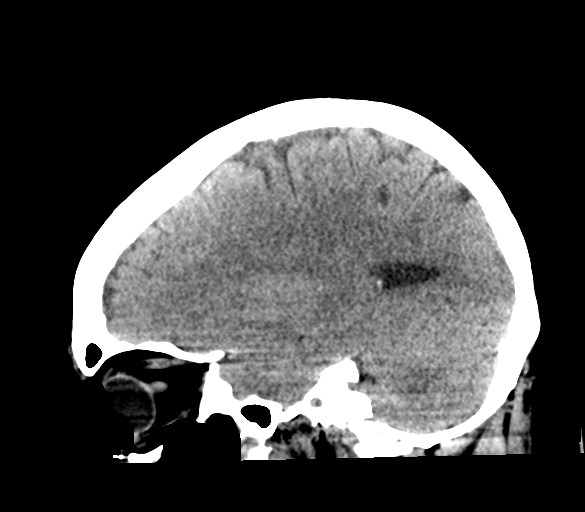

[16 of 47 positions shown; findings below may reference images not displayed]

FINDINGS: Brain: There is no acute intracranial hemorrhage, mass effect, or
edema. Gray-white differentiation is preserved. There is no
extra-axial fluid collection. Ventricles and sulci are within normal
limits in size and configuration.

Vascular: No hyperdense vessel or unexpected calcification.

Skull: Calvarium is unremarkable.

Sinuses/Orbits: No acute finding.

Other: Mastoid air cells are clear.
IMPRESSION: No acute intracranial abnormality.

## 2021-03-23 IMAGING — MR MR HEAD W/O CM
12 of 13 series · 41 of 48 positions shown · non-contrast
Comparison: CT head [DATE]

CLINICAL DATA: Headache.  Left arm weakness.  Rule out CVA

EXAM:
MRI HEAD WITHOUT CONTRAST
TECHNIQUE: Multiplanar, multiecho pulse sequences of the brain and surrounding
structures were obtained without intravenous contrast.

[Series 5: DWI · axial · 4.0mm · 0.88mm/px · z∈[-91,+47]mm · 5 of 36 slices shown (1 of 6)]
[im 1/36]
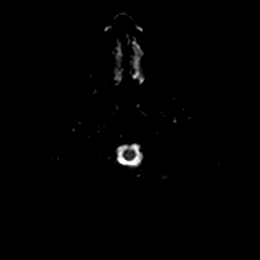
[im 9/36]
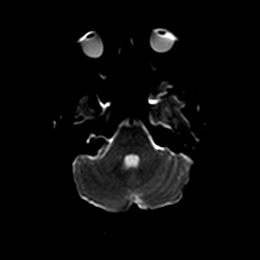
[im 18/36]
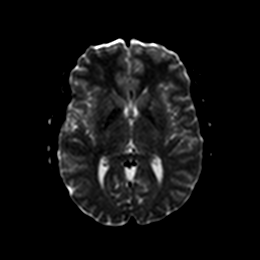
[im 27/36]
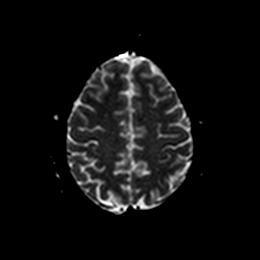
[im 36/36]
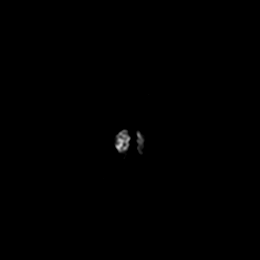

[Series 5: DWI · axial · 4.0mm · 0.88mm/px · z∈[-91,+47]mm · 5 of 36 slices shown (2 of 6)]
[im 1/36]
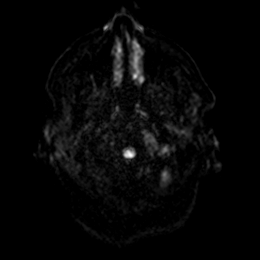
[im 9/36]
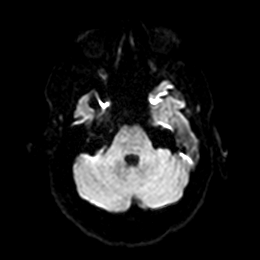
[im 18/36]
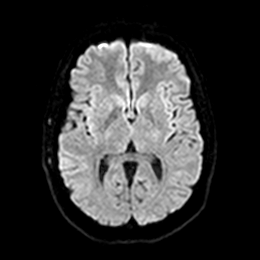
[im 27/36]
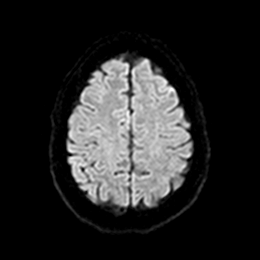
[im 36/36]
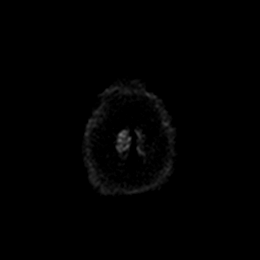

[Series 6: DWI · axial · 4.0mm · 0.88mm/px · z∈[-91,+47]mm · 5 of 36 slices shown (3 of 6)]
[im 1/36]
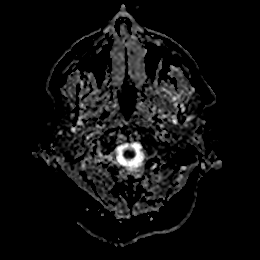
[im 9/36]
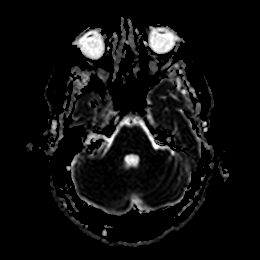
[im 18/36]
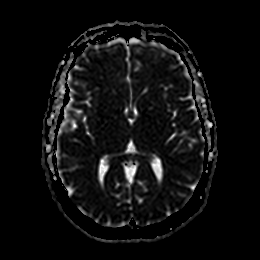
[im 27/36]
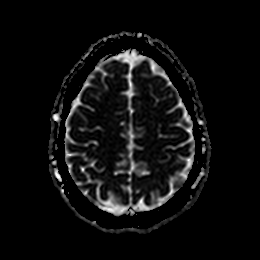
[im 36/36]
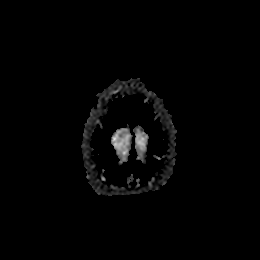

[Series 7: DWI · coronal · 5.0mm · 0.88mm/px · 3 of 28 slices shown (4 of 6)]
[im 1/28]
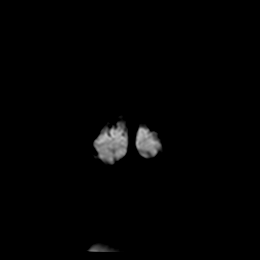
[im 14/28]
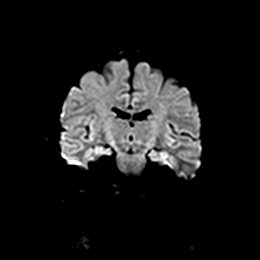
[im 28/28]
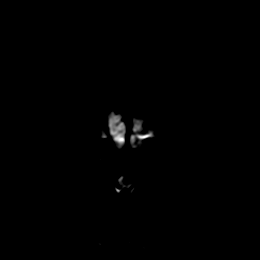

[Series 7: DWI · coronal · 5.0mm · 0.88mm/px · 3 of 28 slices shown (5 of 6)]
[im 1/28]
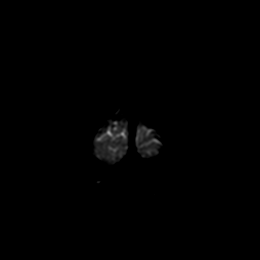
[im 14/28]
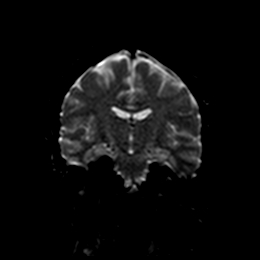
[im 28/28]
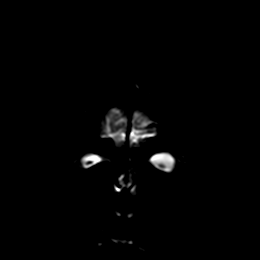

[Series 8: DWI · coronal · 5.0mm · 0.88mm/px · 3 of 28 slices shown (6 of 6)]
[im 1/28]
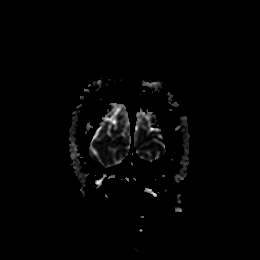
[im 14/28]
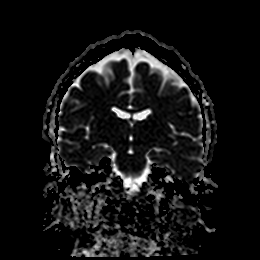
[im 28/28]
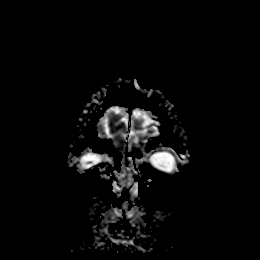

[Series 9: T1 · sagittal · 5.0mm · 0.94mm/px · 3 of 21 slices shown]
[im 1/21]
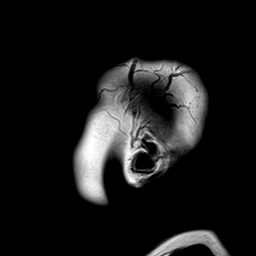
[im 11/21]
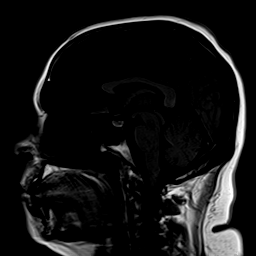
[im 21/21]
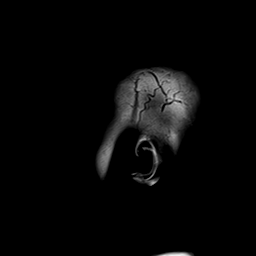

[Series 10: T2 · axial · 5.0mm · 0.72mm/px · z∈[-88,+44]mm · 2 of 20 slices shown (1 of 2)]
[im 1/20]
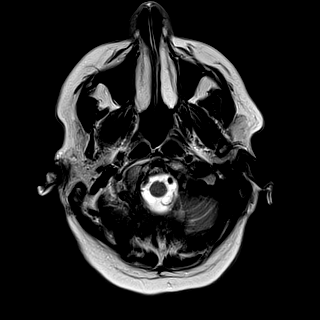
[im 20/20]
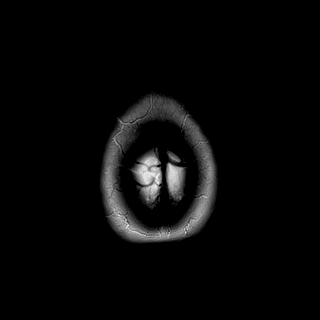

[Series 11: ax hemo · axial · 5.0mm · 0.86mm/px · z∈[-93,+50]mm · 3 of 25 slices shown]
[im 1/25]
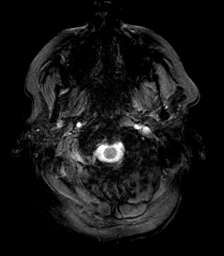
[im 13/25]
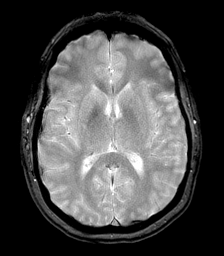
[im 25/25]
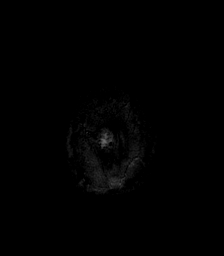

[Series 16: FLAIR · axial · 4.0mm · 0.43mm/px · z∈[-83,+40]mm · 4 of 32 slices shown (1 of 2)]
[im 1/32]
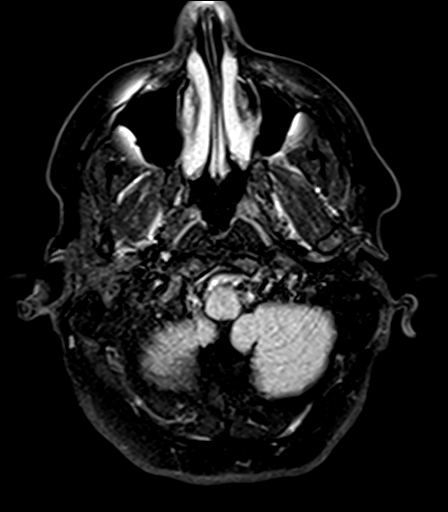
[im 11/32]
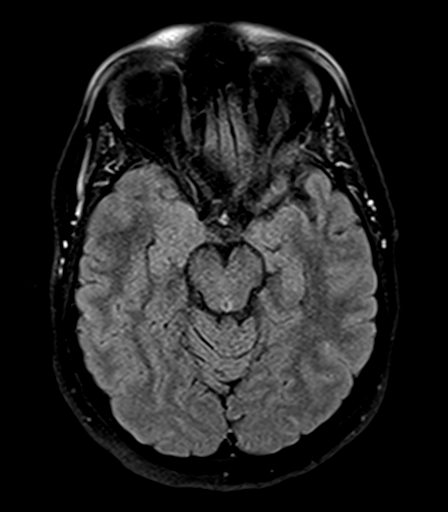
[im 21/32]
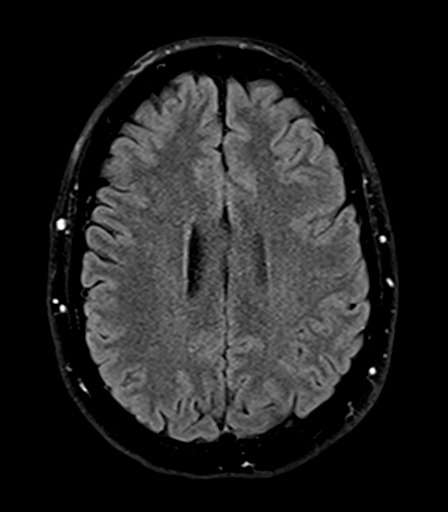
[im 32/32]
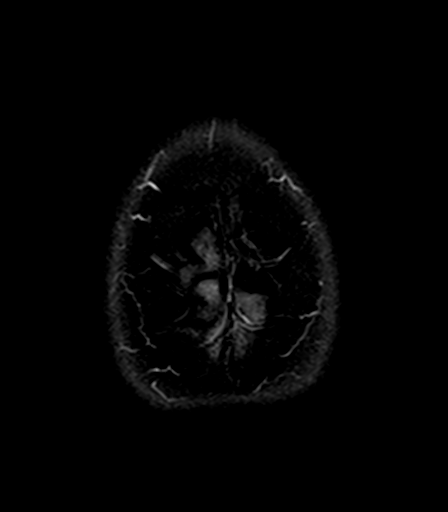

[Series 18: T2 · coronal · 5.0mm · 0.72mm/px · 3 of 26 slices shown (2 of 2)]
[im 1/26]
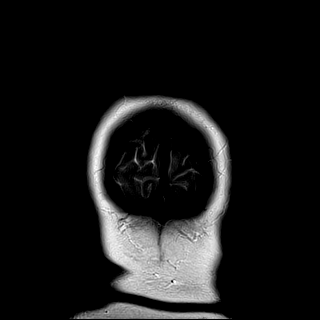
[im 13/26]
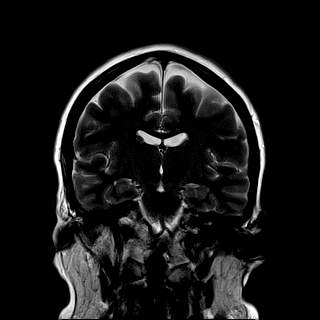
[im 26/26]
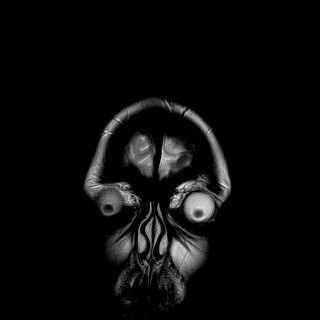

[Series 19: FLAIR · sagittal · 5.0mm · 0.94mm/px · 2 of 19 slices shown (2 of 2)]
[im 1/19]
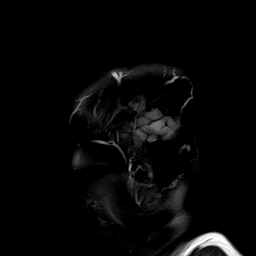
[im 19/19]
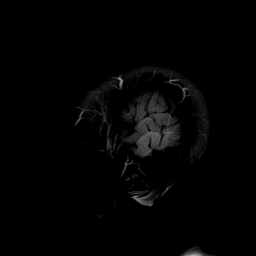

[41 of 48 positions shown; findings below may reference images not displayed]

FINDINGS: Brain: No acute infarction, hemorrhage, hydrocephalus, extra-axial
collection or mass lesion.

Vascular: Normal arterial flow voids at the skull base

Skull and upper cervical spine: No focal skeletal abnormality.

Sinuses/Orbits: Negative

Other: None
IMPRESSION: Negative MRI head without contrast.

## 2021-03-23 IMAGING — MR MR CERVICAL SPINE W/O CM
5 series · 36 of 48 positions shown · non-contrast
Comparison: None.

CLINICAL DATA: Left arm weakness and numbness. Rule out compression
fracture cervical spine

EXAM:
MRI CERVICAL SPINE WITHOUT CONTRAST
TECHNIQUE: Multiplanar, multisequence MR imaging of the cervical spine was
performed. No intravenous contrast was administered.

[Series 5: t2_tse_sag_fast · sagittal · 3.0mm · 0.43mm/px · 6 of 13 slices shown]
[im 1/13]
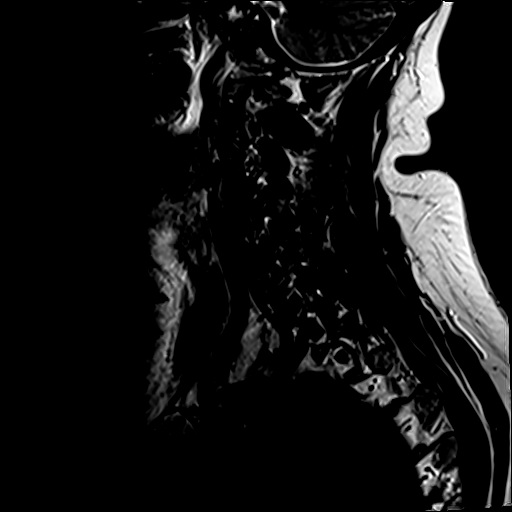
[im 3/13]
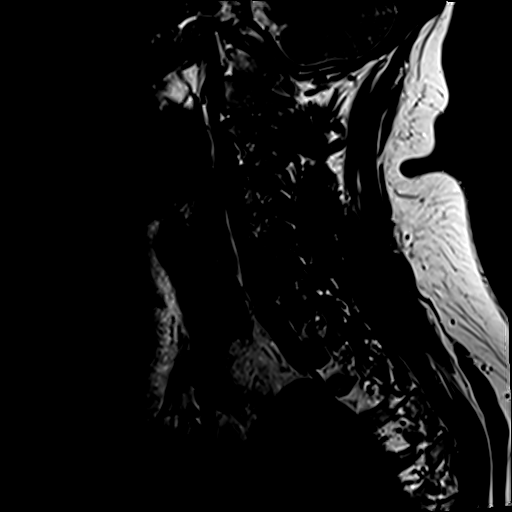
[im 5/13]
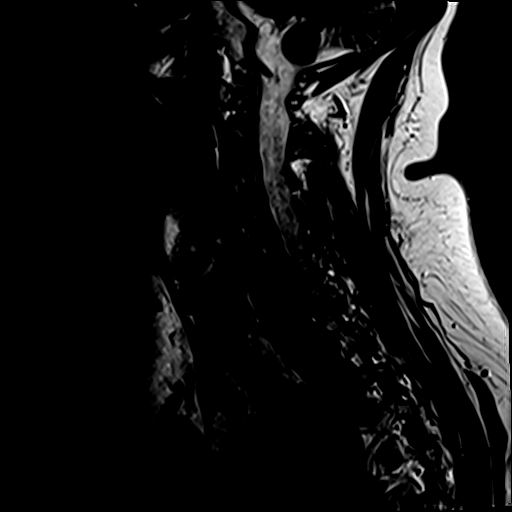
[im 8/13]
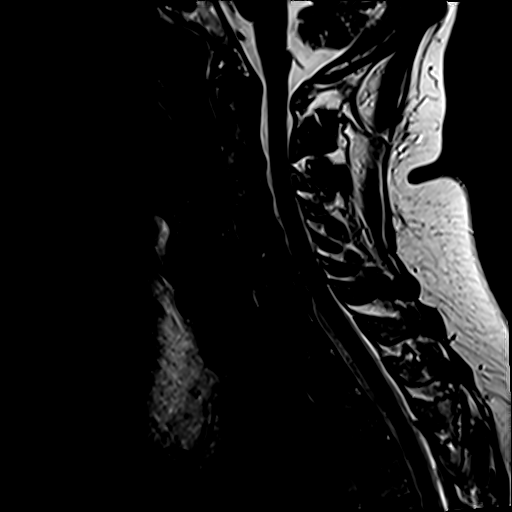
[im 10/13]
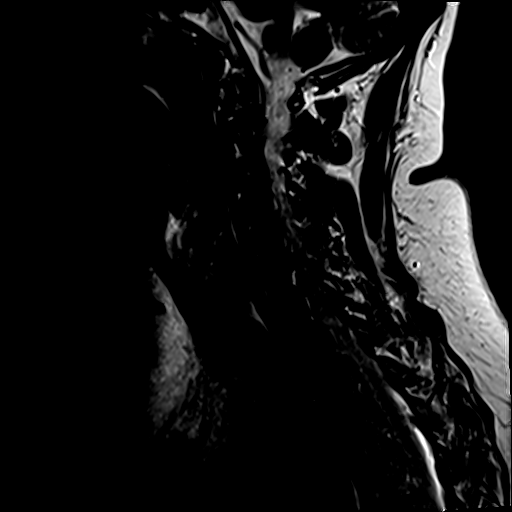
[im 13/13]
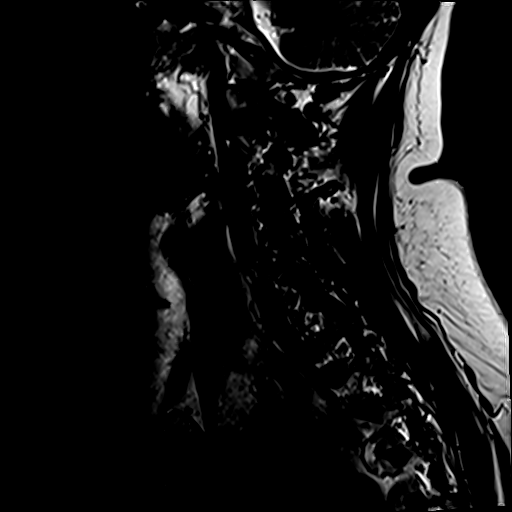

[Series 6: t1_tse_sag_fast · sagittal · 3.0mm · 0.43mm/px · 6 of 13 slices shown]
[im 1/13]
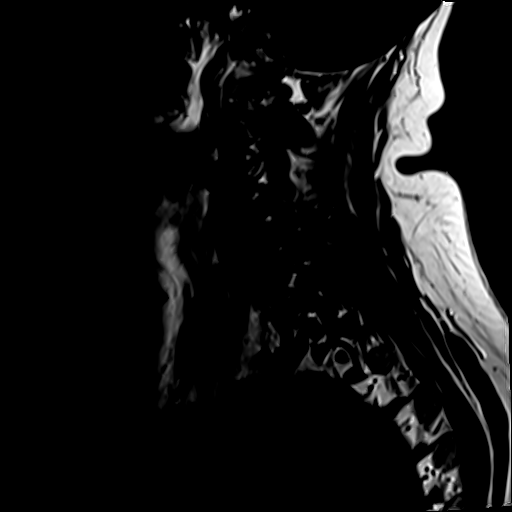
[im 3/13]
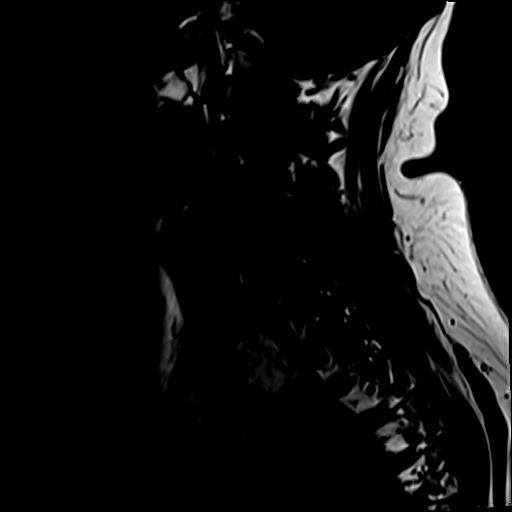
[im 5/13]
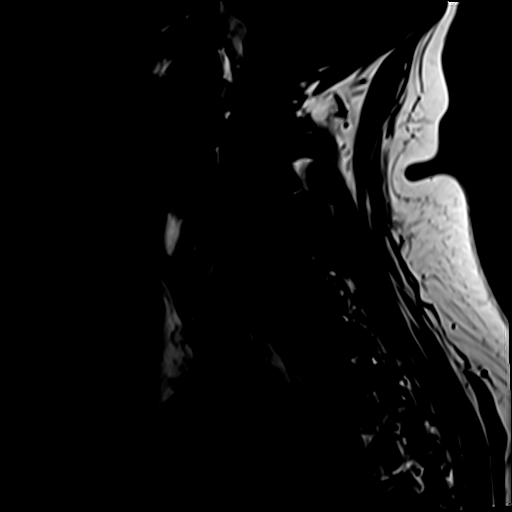
[im 8/13]
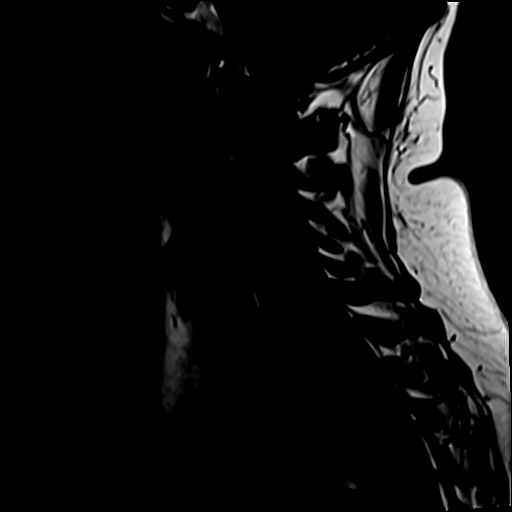
[im 10/13]
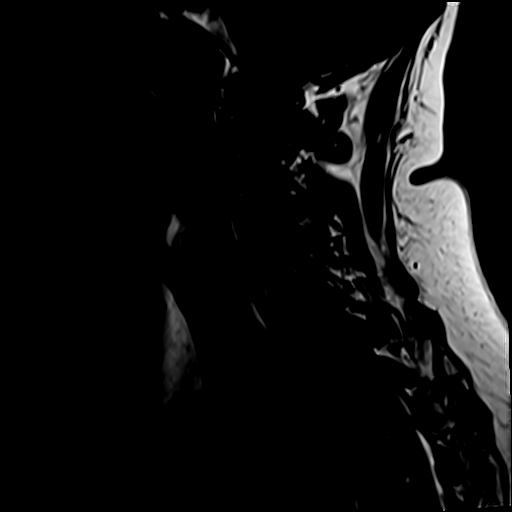
[im 13/13]
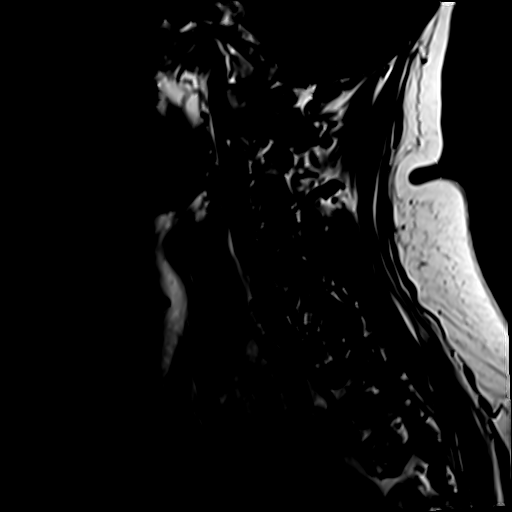

[Series 7: STIR · sagittal · 3.0mm · 0.86mm/px · 6 of 13 slices shown]
[im 1/13]
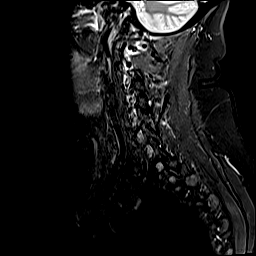
[im 3/13]
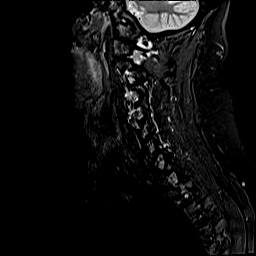
[im 5/13]
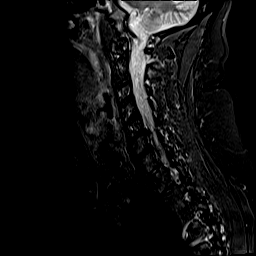
[im 8/13]
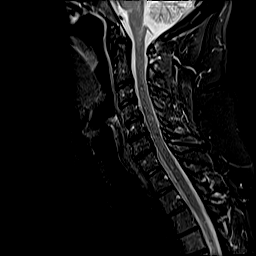
[im 10/13]
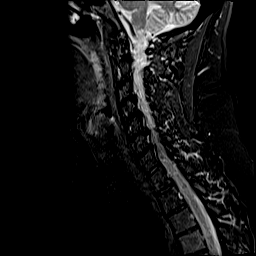
[im 13/13]
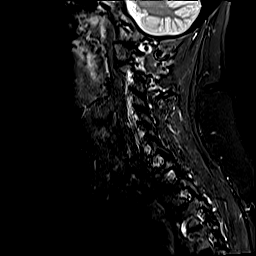

[Series 8: t2_tse_tra_fast · axial · 3.0mm · 0.78mm/px · z∈[-249,-158]mm · 10 of 30 slices shown]
[im 1/30]
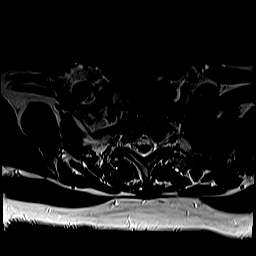
[im 3/30]
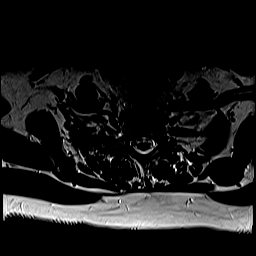
[im 5/30]
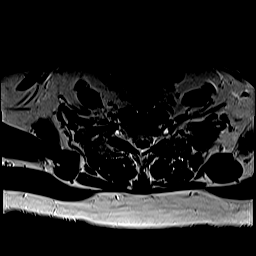
[im 9/30]
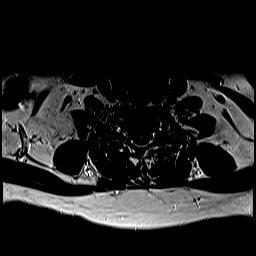
[im 13/30]
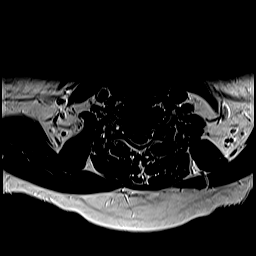
[im 15/30]
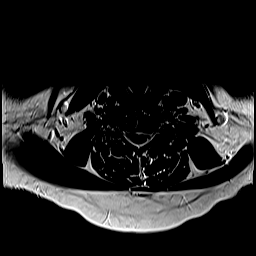
[im 17/30]
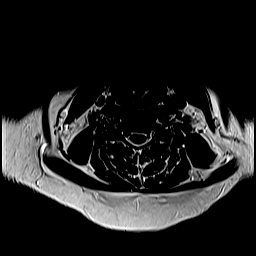
[im 21/30]
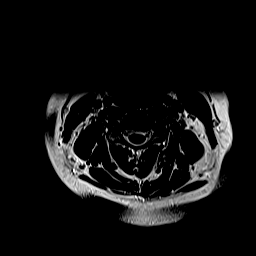
[im 25/30]
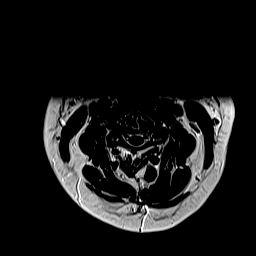
[im 30/30]
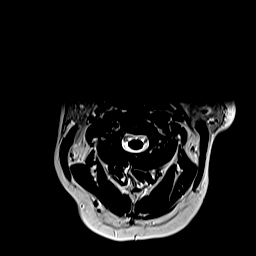

[Series 9: GRE · axial · 3.0mm · 0.78mm/px · z∈[-249,-158]mm · 8 of 30 slices shown]
[im 1/30]
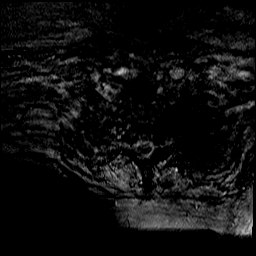
[im 5/30]
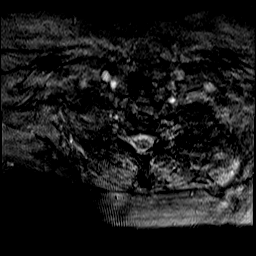
[im 9/30]
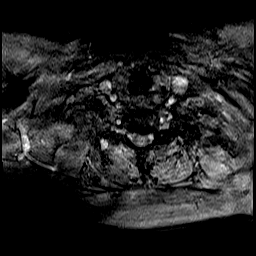
[im 13/30]
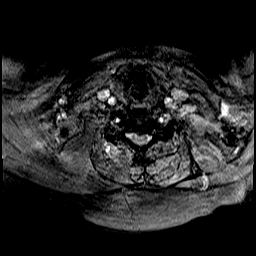
[im 17/30]
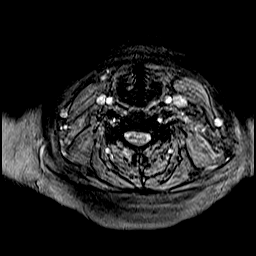
[im 21/30]
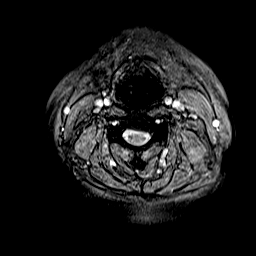
[im 25/30]
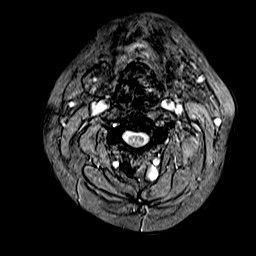
[im 30/30]
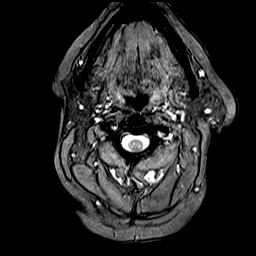

[36 of 48 positions shown; findings below may reference images not displayed]

FINDINGS: Alignment: Normal

Vertebrae: Negative for fracture or mass.  No bone marrow edema

Cord: Normal signal and morphology

Posterior Fossa, vertebral arteries, paraspinal tissues: Negative

Disc levels:

C2-3: Small central disc protrusion.  Negative for stenosis

C3-4: Moderate left foraminal narrowing due to uncinate spurring and
facet hypertrophy. Spinal canal normal in size

C4-5: Moderate left foraminal narrowing due to uncinate spurring and
facet degeneration. Spinal canal adequate in size

C5-6: Moderate left foraminal narrowing due to uncinate spurring.
Mild central canal stenosis.

C6-7: Mild left foraminal narrowing due to spurring.

C7-T1: Negative
IMPRESSION: Negative for fracture

Left foraminal narrowing C3-4, C4-5, C5-6, and C6-7 due to
asymmetric spurring.

## 2021-03-23 IMAGING — CT CT ANGIO HEAD-NECK (W OR W/O PERF)
2 of 7 series · 8 of 33 positions shown · IV contrast (Omnipaque or Isovue)
Comparison: Comparison made with prior CT and MRI from earlier the
same day.

CLINICAL DATA: Initial evaluation for neuro deficit, stroke
suspected, left upper extremity numbness.//

EXAM:
CT ANGIOGRAPHY HEAD AND NECK
TECHNIQUE: Multidetector CT imaging of the head and neck was performed using
the standard protocol during bolus administration of intravenous
contrast. Multiplanar CT image reconstructions and MIPs were
obtained to evaluate the vascular anatomy. Carotid stenosis
measurements (when applicable) are obtained utilizing NASCET
criteria, using the distal internal carotid diameter as the
denominator.
CONTRAST:  75mL OMNIPAQUE IOHEXOL 350 MG/ML SOLN

[Series 5: cta head & neck · axial · 0.54mm/px · z∈[+115,+237]mm · 2 of 184 slices shown]
[im 62/184  soft-tissue]
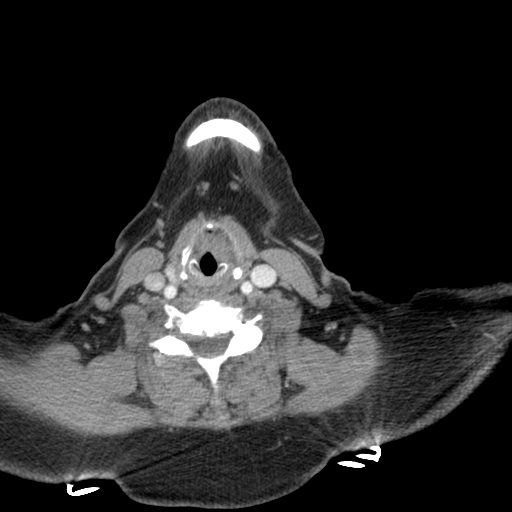
[im 123/184  soft-tissue]
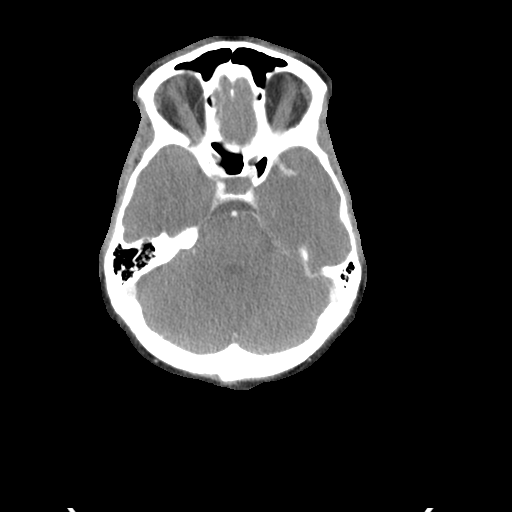

[Series 7: ax thins · axial · 0.45mm/px · z∈[+46,+311]mm · 6 of 371 slices shown]
[im 53/371  soft-tissue]
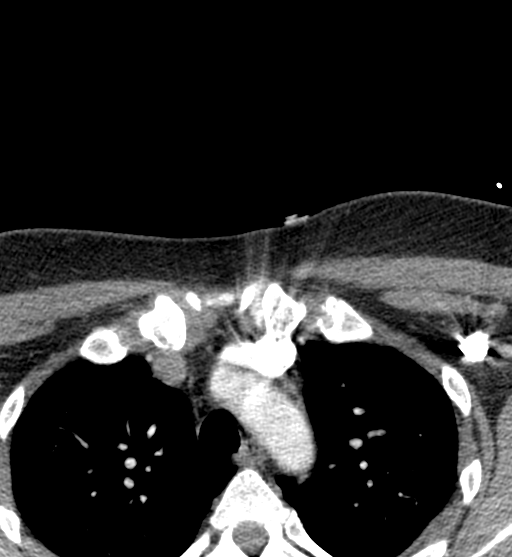
[im 106/371  bone]
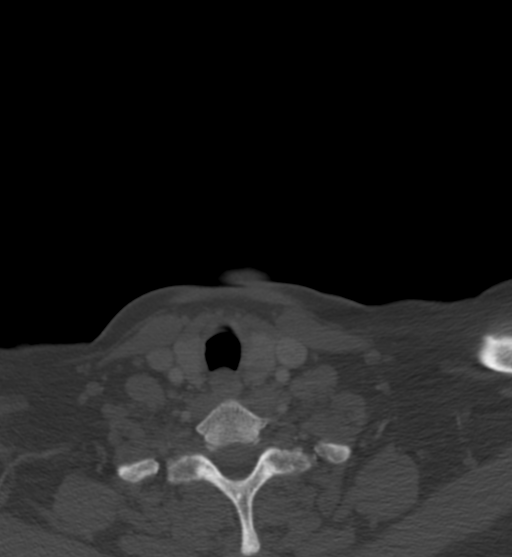
[im 159/371  soft-tissue]
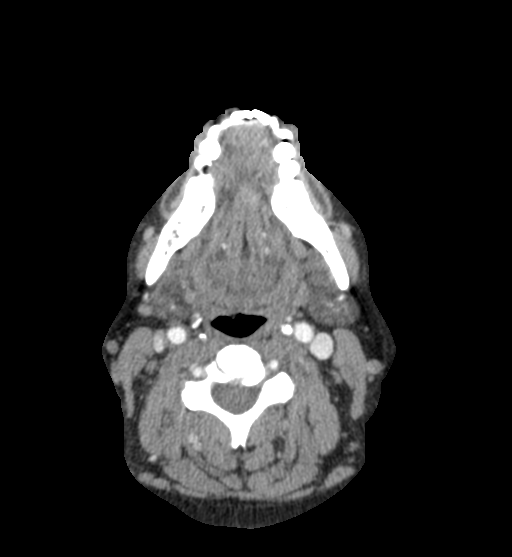
[im 212/371  bone]
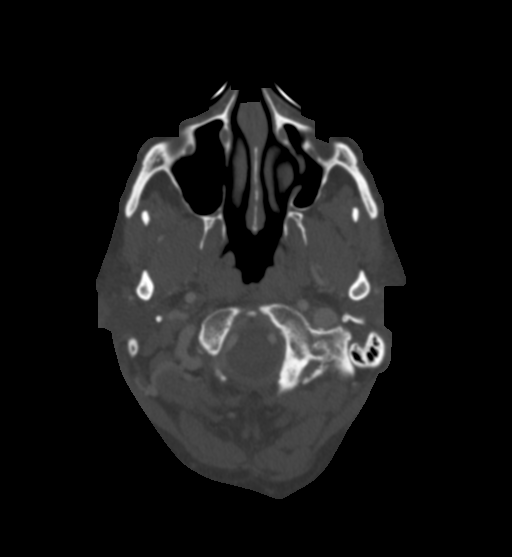
[im 265/371  soft-tissue]
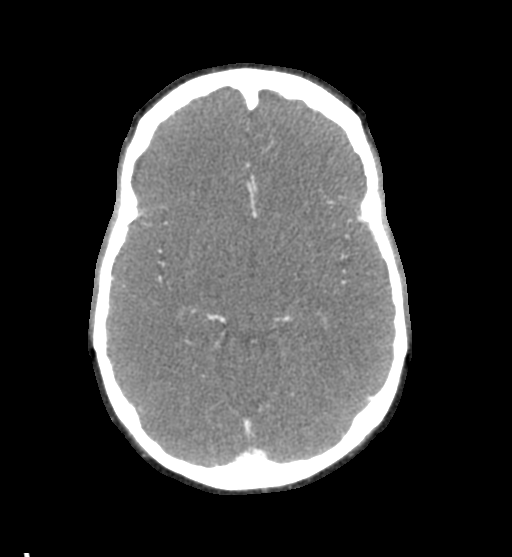
[im 318/371  bone]
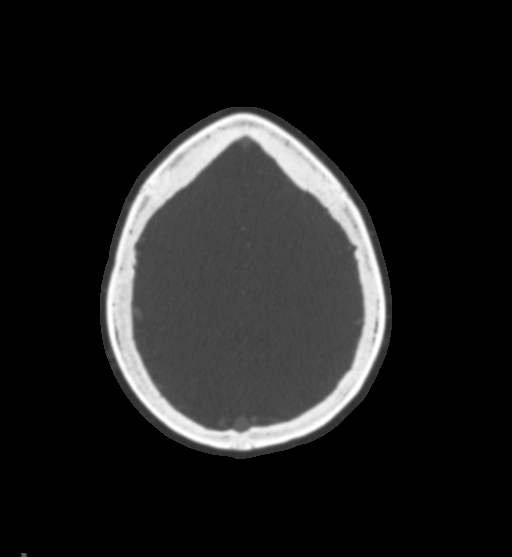

[8 of 33 positions shown; findings below may reference images not displayed]

FINDINGS: CTA NECK FINDINGS

Aortic arch: Visualized aortic arch normal caliber with normal 3
vessel morphology. No stenosis about the origin of the great
vessels.

Right carotid system: Right common and internal carotid arteries
widely patent without stenosis, dissection or occlusion.

Left carotid system: Left common and internal carotid arteries
widely patent without stenosis, dissection or occlusion. Minimal
plaque at the left carotid bifurcation without stenosis.

Vertebral arteries: Both vertebral arteries arise from the
subclavian arteries. No proximal subclavian artery stenosis. Both
vertebral arteries widely patent without stenosis, dissection or
occlusion.

Skeleton: No worrisome lytic or blastic osseous lesions.
Subcentimeter sclerotic lesion within the C5 vertebral body likely
reflects a benign bone island. Few scattered dental caries noted.

Other neck: No other acute soft tissue abnormality within the neck.
8 mm left thyroid nodule noted, of doubtful significance given size
and patient age, no follow-up imaging recommended (ref: [HOSPITAL]. [DATE]): 143-50).

Upper chest: Visualized upper chest demonstrates no acute finding.
Few scattered calcified granulomata noted.

Review of the MIP images confirms the above findings

CTA HEAD FINDINGS

Anterior circulation: Both internal carotid arteries widely patent
to the termini without stenosis. A1 segments widely patent. Normal
anterior communicating artery complex. Both anterior cerebral
arteries widely patent to their distal aspects without stenosis. No
M1 stenosis or occlusion. Normal MCA bifurcations. Distal MCA
branches well perfused and symmetric.

Posterior circulation: Both V4 segments patent to the
vertebrobasilar junction without stenosis. Both PICA origins patent
and normal. Basilar widely patent to its distal aspect without
stenosis. Superior cerebellar arteries patent bilaterally. Both PCAs
primarily supplied via the basilar and are well perfused to there
distal aspects.

Venous sinuses: Patent allowing for timing the contrast bolus.

Anatomic variants: None significant.  No aneurysm.

Review of the MIP images confirms the above findings
IMPRESSION: Normal CTA of the head and neck. No large vessel occlusion,
hemodynamically significant stenosis, or other acute vascular
abnormality.

## 2021-03-23 MED ORDER — ENOXAPARIN SODIUM 40 MG/0.4ML IJ SOSY
40.0000 mg | PREFILLED_SYRINGE | INTRAMUSCULAR | Status: DC
  Administered 2021-03-23: 22:00:00 40 mg via SUBCUTANEOUS
  Filled 2021-03-23: qty 0.4

## 2021-03-23 MED ORDER — ATORVASTATIN CALCIUM 40 MG PO TABS
40.0000 mg | ORAL_TABLET | Freq: Every day | ORAL | Status: DC
  Administered 2021-03-23 – 2021-03-24 (×2): 40 mg via ORAL
  Filled 2021-03-23 (×2): qty 1

## 2021-03-23 MED ORDER — PANTOPRAZOLE SODIUM 40 MG PO TBEC
40.0000 mg | DELAYED_RELEASE_TABLET | Freq: Two times a day (BID) | ORAL | Status: DC
  Administered 2021-03-24 (×2): 40 mg via ORAL
  Filled 2021-03-23 (×2): qty 1

## 2021-03-23 MED ORDER — SODIUM CHLORIDE 0.9 % IV SOLN: INTRAVENOUS | Status: DC

## 2021-03-23 MED ORDER — IOHEXOL 350 MG/ML SOLN
75.0000 mL | Freq: Once | INTRAVENOUS | Status: AC | PRN
  Administered 2021-03-23: 20:00:00 75 mL via INTRAVENOUS

## 2021-03-23 MED ORDER — ACETAMINOPHEN 325 MG PO TABS: 650.0000 mg | ORAL_TABLET | ORAL | Status: DC | PRN

## 2021-03-23 MED ORDER — ACETAMINOPHEN 160 MG/5ML PO SOLN: 650.0000 mg | ORAL | Status: DC | PRN

## 2021-03-23 MED ORDER — ACETAMINOPHEN 650 MG RE SUPP: 650.0000 mg | RECTAL | Status: DC | PRN

## 2021-03-23 MED ORDER — ASPIRIN EC 81 MG PO TBEC
81.0000 mg | DELAYED_RELEASE_TABLET | Freq: Every day | ORAL | Status: DC
  Administered 2021-03-24: 08:00:00 81 mg via ORAL
  Filled 2021-03-23: qty 1

## 2021-03-23 MED ORDER — ASPIRIN 325 MG PO TABS
325.0000 mg | ORAL_TABLET | Freq: Once | ORAL | Status: AC
  Administered 2021-03-23: 15:00:00 325 mg via ORAL
  Filled 2021-03-23: qty 1

## 2021-03-23 MED ORDER — STROKE: EARLY STAGES OF RECOVERY BOOK: Freq: Once | Status: AC

## 2021-03-23 NOTE — ED Provider Notes (Signed)
Emergency Medicine Provider Triage Evaluation Note  Emily Boone , a 45 y.o. female  was evaluated in triage.  Pt complains of Emergency Medicine Provider Triage Evaluation Note  Emily Boone , a 45 y.o. female  was evaluated in triage.  Pt complains of left upper extremity heaviness and numbness since just before 7 AM this morning.  She reports progressive worsening and chest heaviness as well.  She reports similar episodes in the past but none that lasted more than 20 minutes.  Review of Systems  Positive: Left upper extremity numbness, heaviness, chest pressure Negative: Fever, lightheadedness, dizziness, visual change  Physical Exam  BP (!) 180/89 (BP Location: Right Arm)    Pulse 74    Temp 97.8 F (36.6 C) (Oral)    Resp 16    Ht 5\' 10"  (1.778 m)    Wt 104.3 kg    LMP 03/06/2021 (Exact Date)    SpO2 100%    BMI 33.00 kg/m  Gen:   Awake, no distress   Resp:  Normal effort  MSK:   Moves extremities without difficulty  Other:  4/5 strength in left upper extremity compared to 5/5 on the right.  Decreased sensation left upper extremity and left side of face compared to the contralateral side.  Bilateral lower extremities with 5/5 strength and symmetrical sensation.  Finger-to-nose intact bilaterally.  Minimal pronator drift present on left side.without facial droop or dysarthria.   Medical Decision Making  Medically screening exam initiated at 10:34 AM.  Appropriate orders placed.  Jamell Vanasten was informed that the remainder of the evaluation will be completed by another provider, this initial triage assessment does not replace that evaluation, and the importance of remaining in the ED until their evaluation is complete.  Stroke order set initiated.     Evlyn Courier, PA-C 03/23/21 [place], Wonda Olds, MD 03/28/21 [phone]

## 2021-03-23 NOTE — H&P (Signed)
History and Physical    Emily Boone FBP:[id number] DOB: [birth year] DOA: 03/23/2021  PCP: Baruch Gouty, FNP  Patient coming from: Home  I have personally briefly reviewed patient's old medical records in [place]  Chief Complaint: Left arm numbness and weakness  HPI: Emily Boone is a 45 y.o. female with medical history significant of hyperlipidemia, started on statin within the past few weeks, presents to the emergency room today with left arm numbness.  Patient states she woke up this morning without any symptoms.  Between 530 and 6 AM, when she was getting ready for work, she began feeling numbness in her left arm which extended up into her left neck and mildly to her face.  She describes associated throbbing in her left arm which is what caught her attention.  Although she does say she feels weak in her left arm, she is able to hold things in her hand.  She can lift her left arm up, but only to about 90 degrees.  She also noticed significant swelling in her left arm which appears to have since resolved.  Reports similar symptoms in the past that occur approximately once a month, lasts about 15 minutes and then resolve spontaneously.  Denies any complaints in other extremities, no changes in vision, no speech impairment, no difficulty swallowing.  ED Course: She was evaluated emergency room her CT head noted to be unremarkable.  Basic labs also unrevealing.  Case was discussed with neurology who recommended admission to the hospital for further work-up including MRI brain/C-spine, CTA of the head and neck, TTE, EEG.  Review of Systems: As per HPI otherwise 10 point review of systems negative.    Past Medical History:  Diagnosis Date   Anxiety    Depression    GERD (gastroesophageal reflux disease)    History of kidney stones    PTSD (post-traumatic stress disorder)     Past Surgical History:  Procedure Laterality Date   CHOLECYSTECTOMY     EPIGASTRIC HERNIA REPAIR N/A  12/14/2016   Procedure: OPEN REPAIR OF EPIGASTRIC HERNIA ;  Surgeon: Clovis Riley, MD;  Location: [facility];  Service: General;  Laterality: N/A;   HERNIA REPAIR     INSERTION OF MESH N/A 12/14/2016   Procedure: INSERTION OF MESH;  Surgeon: Clovis Riley, MD;  Location: [facility];  Service: General;  Laterality: N/A;   KNEE ARTHROSCOPY Right    TUBAL LIGATION     UMBILICAL HERNIA REPAIR N/A 12/14/2016   Procedure: OPEN REPAIR OF UMBILICAL HERNIA;  Surgeon: Clovis Riley, MD;  Location: [facility];  Service: General;  Laterality: N/A;    Social History:  reports that she quit smoking about 5 years ago. Her smoking use included cigarettes. She has a 1.70 pack-year smoking history. She has never used smokeless tobacco. She reports that she does not drink alcohol and does not use drugs.  Allergies  Allergen Reactions   Clindamycin/Lincomycin Other (See Comments)    "stuck in throat" cannot take pill form; can take as liquid   Penicillins Swelling and Rash    Childhood reaction and family history  Has patient had a PCN reaction causing immediate rash, facial/tongue/throat swelling, SOB or lightheadedness with hypotension: Unknown Has patient had a PCN reaction causing severe rash involving mucus membranes or skin necrosis: Unknown Has patient had a PCN reaction that required hospitalization: Unknown Has patient had a PCN reaction occurring within the last 10 years: No If all of the above answers are "  NO", then may proceed with Cephalosporin use.     Family History  Problem Relation Age of Onset   Hypertension Maternal Grandmother    Depression Maternal Grandmother    Cancer Maternal Grandfather    Diverticulitis Paternal Grandmother    Breast cancer Neg Hx     Prior to Admission medications   Medication Sig Start Date End Date Taking? Authorizing Provider  atorvastatin (LIPITOR) 40 MG tablet Take 1 tablet (40 mg total) by mouth daily. 03/10/21  Yes Rakes, Connye Burkitt, FNP  fluticasone  (FLONASE) 50 MCG/ACT nasal spray Place 2 sprays into both nostrils daily. 03/09/21  Yes Rakes, Connye Burkitt, FNP  loratadine (CLARITIN) 10 MG tablet Take 10 mg by mouth daily.   Yes [provider]  omeprazole (PRILOSEC) 20 MG capsule Take 20 mg by mouth 2 (two) times daily before a meal.   Yes [provider]    Physical Exam: Vitals:   03/23/21 1330 03/23/21 1400 03/23/21 1620 03/23/21 1909  BP: 128/75 (!) 148/87 139/79 117/66  Pulse: 67 87 60 85  Resp: 17 13 20 18   Temp:   98.3 F (36.8 C) 98.2 F (36.8 C)  TempSrc:   Oral Oral  SpO2: 100% 100% 100% 98%  Weight:      Height:        Constitutional: NAD, calm, comfortable Eyes: PERRL, lids and conjunctivae normal ENMT: Mucous membranes are moist. Posterior pharynx clear of any exudate or lesions.Normal dentition.  Neck: normal, supple, no masses, no thyromegaly Respiratory: clear to auscultation bilaterally, no wheezing, no crackles. Normal respiratory effort. No accessory muscle use.  Cardiovascular: Regular rate and rhythm, no murmurs / rubs / gallops. No extremity edema. 2+ pedal pulses. No carotid bruits.  Abdomen: no tenderness, no masses palpated. No hepatosplenomegaly. Bowel sounds positive.  Musculoskeletal: no clubbing / cyanosis. No joint deformity upper and lower extremities. Good ROM, no contractures. Normal muscle tone.  Skin: no rashes, lesions, ulcers. No induration Neurologic: CN 2-12 grossly intact.  Continued numbness in left upper extremity.  Strength 4 out of 5 strength in left upper extremity, 5 out of 5 strength in remainder of extremities. Psychiatric: Normal judgment and insight. Alert and oriented x 3. Normal mood.    Labs on Admission: I have personally reviewed following labs and imaging studies  CBC: Recent Labs  Lab 03/23/21 1125  WBC 10.2  HGB 11.2*  HCT 35.4*  MCV 87.8  PLT 854   Basic Metabolic Panel: Recent Labs  Lab 03/23/21 1056  NA 137  K 4.0  CL 104  CO2 25   GLUCOSE 90  BUN 9  CREATININE 0.64  CALCIUM 8.8*   GFR: Estimated Creatinine Clearance: 114.9 mL/min (by C-G formula based on SCr of 0.64 mg/dL). Liver Function Tests: Recent Labs  Lab 03/23/21 1056  AST 22  ALT 18  ALKPHOS 57  BILITOT 0.4  PROT 7.1  ALBUMIN 3.8   No results for input(s): LIPASE, AMYLASE in the last 168 hours. No results for input(s): AMMONIA in the last 168 hours. Coagulation Profile: Recent Labs  Lab 03/23/21 1056  INR 1.0   Cardiac Enzymes: No results for input(s): CKTOTAL, CKMB, CKMBINDEX, TROPONINI in the last 168 hours. BNP (last 3 results) No results for input(s): PROBNP in the last 8760 hours. HbA1C: No results for input(s): HGBA1C in the last 72 hours. CBG: No results for input(s): GLUCAP in the last 168 hours. Lipid Profile: No results for input(s): CHOL, HDL, LDLCALC, TRIG, CHOLHDL, LDLDIRECT in the  last 72 hours. Thyroid Function Tests: No results for input(s): TSH, T4TOTAL, FREET4, T3FREE, THYROIDAB in the last 72 hours. Anemia Panel: No results for input(s): VITAMINB12, FOLATE, FERRITIN, TIBC, IRON, RETICCTPCT in the last 72 hours. Urine analysis:    Component Value Date/Time   COLORURINE YELLOW 03/23/2021 1221   APPEARANCEUR CLEAR 03/23/2021 1221   APPEARANCEUR Clear 06/18/2019 1139   LABSPEC 1.015 03/23/2021 1221   PHURINE 6.0 03/23/2021 1221   GLUCOSEU NEGATIVE 03/23/2021 1221   HGBUR SMALL (A) 03/23/2021 1221   BILIRUBINUR NEGATIVE 03/23/2021 1221   BILIRUBINUR Negative 06/18/2019 1139   KETONESUR NEGATIVE 03/23/2021 1221   PROTEINUR NEGATIVE 03/23/2021 1221   NITRITE NEGATIVE 03/23/2021 1221   LEUKOCYTESUR NEGATIVE 03/23/2021 1221    Radiological Exams on Admission: CT Head Wo Contrast  Result Date: 03/23/2021 CLINICAL DATA:  Headache, new or worsening (Age >= 50y) EXAM: CT HEAD WITHOUT CONTRAST TECHNIQUE: Contiguous axial images were obtained from the base of the skull through the vertex without intravenous contrast.  COMPARISON:  None. FINDINGS: Brain: There is no acute intracranial hemorrhage, mass effect, or edema. Gray-white differentiation is preserved. There is no extra-axial fluid collection. Ventricles and sulci are within normal limits in size and configuration. Vascular: No hyperdense vessel or unexpected calcification. Skull: Calvarium is unremarkable. Sinuses/Orbits: No acute finding. Other: Mastoid air cells are clear. IMPRESSION: No acute intracranial abnormality. Electronically Signed   By: Macy Mis M.D.   On: 03/23/2021 12:42   MR BRAIN WO CONTRAST  Result Date: 03/23/2021 CLINICAL DATA:  Headache.  Left arm weakness.  Rule out CVA EXAM: MRI HEAD WITHOUT CONTRAST TECHNIQUE: Multiplanar, multiecho pulse sequences of the brain and surrounding structures were obtained without intravenous contrast. COMPARISON:  CT head 03/23/2021 FINDINGS: Brain: No acute infarction, hemorrhage, hydrocephalus, extra-axial collection or mass lesion. Vascular: Normal arterial flow voids at the skull base Skull and upper cervical spine: No focal skeletal abnormality. Sinuses/Orbits: Negative Other: None IMPRESSION: Negative MRI head without contrast. Electronically Signed   By: Franchot Gallo M.D.   On: 03/23/2021 15:00   MR CERVICAL SPINE WO CONTRAST  Result Date: 03/23/2021 CLINICAL DATA:  Left arm weakness and numbness. Rule out compression fracture cervical spine EXAM: MRI CERVICAL SPINE WITHOUT CONTRAST TECHNIQUE: Multiplanar, multisequence MR imaging of the cervical spine was performed. No intravenous contrast was administered. COMPARISON:  None. FINDINGS: Alignment: Normal Vertebrae: Negative for fracture or mass.  No bone marrow edema Cord: Normal signal and morphology Posterior Fossa, vertebral arteries, paraspinal tissues: Negative Disc levels: C2-3: Small central disc protrusion.  Negative for stenosis C3-4: Moderate left foraminal narrowing due to uncinate spurring and facet hypertrophy. Spinal canal normal in  size C4-5: Moderate left foraminal narrowing due to uncinate spurring and facet degeneration. Spinal canal adequate in size C5-6: Moderate left foraminal narrowing due to uncinate spurring. Mild central canal stenosis. C6-7: Mild left foraminal narrowing due to spurring. C7-T1: Negative IMPRESSION: Negative for fracture Left foraminal narrowing C3-4, C4-5, C5-6, and C6-7 due to asymmetric spurring. Electronically Signed   By: Franchot Gallo M.D.   On: 03/23/2021 15:03    EKG: Independently reviewed. Sinus rhythm without acute changes  Assessment/Plan Principal Problem:   Left arm weakness Active Problems:   GERD without esophagitis   Pure hypercholesterolemia     Left arm weakness/numbness -Etiology is unclear -MRI of the brain/cervical spine with and without contrast have been ordered -Also ordered CTA of head and neck -Checking echocardiogram with bubble study -EEG -A1c and lipid panel -She has been started  on aspirin -PT/OT -We will follow-up with neurology for further recommendations once above studies have been performed.  Hyperlipidemia -Continue statin  GERD -Continue PPI  DVT prophylaxis: lovenox  Code Status: full code  Family Communication: discussed with husband at the bedside  Disposition Plan: discharge home once work up complete  Consults called: neurology  Admission status: Observation, telemetry  Kathie Dike MD [facility]   If 7PM-7AM, please contact night-coverage [email or web address]   03/23/2021, 8:07 PM

## 2021-03-23 NOTE — Progress Notes (Signed)
Contacted by [facility] ED regarding this 45 yo patient who presented with LUE numbness and heaviness since before 7am today. She has had multiple episodes like this over the past several months (all stereotyped) but previous episodes have resolved within 15-20 min. Current exam per ED 4/5 weakness and sensory deficit in LUE, no other neurologic deficits. Patient is outside window for TNK and exam not c/w LVO therefore no indication for stroke code.  Recommendations:  - Admit to hospitalist service for TIA workup - Permissive HTN x48 hrs from sx onset or until stroke ruled out by MRI goal BP <220/110. PRN labetalol or hydralazine if BP above these parameters. Avoid oral antihypertensives. - MRI brain and MRI c spine with and without contrast (contrast bc seizure is on differential, MRI c spine in case this is 2/2 cervical radiculopathy) - CTA or MRA head and neck - TTE w/ bubble - Routine EEG - Check A1c and LDL + add statin per guidelines - ASA 325mg  now f/b 81mg  daily - q4 hr neuro checks - STAT head CT for any change in neuro exam - Tele - PT/OT/SLP - Stroke education - Amb referral to neurology upon discharge   If additional questions for neurology arise, please place consult to Dr. Hortense Ramal during business hours M-F (see amion).  Su Monks, MD [facility] [phone]  If 7pm- 7am, please page neurology on call as listed in [place].

## 2021-03-23 NOTE — ED Triage Notes (Signed)
Pt to the ED with complaints of left arm weakness and numbness that began this morning.  Pt states her left arm is also starting to swell.

## 2021-03-23 NOTE — ED Notes (Signed)
Patient transported to MRI 

## 2021-03-23 NOTE — ED Provider Notes (Signed)
[facility] Provider Note   CSN: [id number] Arrival date & time: 03/23/21  9629     History Chief Complaint  Patient presents with   Extremity Weakness    Emily Boone is a 45 y.o. female.  45 year old female presents today for evaluation of left upper extremity numbness and heaviness that started just prior to 7 AM this morning and has progressively worsened.  She reports she has had similar episodes over the past few months with most recent being about 1 month ago that were transient and lasted about 15 to 20 minutes.  She also endorses chest heaviness today.  She denies lightheadedness, dizziness, visual change, diaphoresis, nausea, or abdominal pain.  Only medication patient takes is atorvastatin.  She reports compliance with this medication.  Denies other medical history that she is aware of.  Denies prior history of CVAs, or MI.  The history is provided by the patient. No language interpreter was used.      Past Medical History:  Diagnosis Date   Anxiety    Depression    GERD (gastroesophageal reflux disease)    History of kidney stones    PTSD (post-traumatic stress disorder)     Patient Active Problem List   Diagnosis Date Noted   Vitamin D deficiency 03/09/2021   Pure hypercholesterolemia 12/17/2018   Fibromyalgia 05/01/2018   BMI 33.0-33.9,adult 05/01/2018   Polyarthritis 01/27/2018   GERD without esophagitis 01/27/2018   Atypical mole 01/27/2018   Skin tags, multiple acquired 01/27/2018   Hernia of abdominal wall 12/14/2016    Past Surgical History:  Procedure Laterality Date   CHOLECYSTECTOMY     EPIGASTRIC HERNIA REPAIR N/A 12/14/2016   Procedure: OPEN REPAIR OF EPIGASTRIC HERNIA ;  Surgeon: Clovis Riley, MD;  Location: [facility];  Service: General;  Laterality: N/A;   HERNIA REPAIR     INSERTION OF MESH N/A 12/14/2016   Procedure: INSERTION OF MESH;  Surgeon: Clovis Riley, MD;  Location: [facility];  Service: General;   Laterality: N/A;   KNEE ARTHROSCOPY Right    TUBAL LIGATION     UMBILICAL HERNIA REPAIR N/A 12/14/2016   Procedure: OPEN REPAIR OF UMBILICAL HERNIA;  Surgeon: Clovis Riley, MD;  Location: [facility];  Service: General;  Laterality: N/A;     OB History   No obstetric history on file.     Family History  Problem Relation Age of Onset   Hypertension Maternal Grandmother    Depression Maternal Grandmother    Cancer Maternal Grandfather    Diverticulitis Paternal Grandmother    Breast cancer Neg Hx     Social History   Tobacco Use   Smoking status: Former    Packs/day: 0.10    Years: 17.00    Pack years: 1.70    Types: Cigarettes    Quit date: 09/01/2015    Years since quitting: 5.5   Smokeless tobacco: Never  Vaping Use   Vaping Use: Former  Substance Use Topics   Alcohol use: No   Drug use: No    Home Medications Prior to Admission medications   Medication Sig Start Date End Date Taking? Authorizing Provider  atorvastatin (LIPITOR) 40 MG tablet Take 1 tablet (40 mg total) by mouth daily. 03/10/21   Baruch Gouty, FNP  fluticasone (FLONASE) 50 MCG/ACT nasal spray Place 2 sprays into both nostrils daily. 03/09/21   Baruch Gouty, FNP  omeprazole (PRILOSEC) 20 MG capsule Take 20 mg by mouth 2 (two) times daily  before a meal.    [provider]    Allergies    Clindamycin/lincomycin and Penicillins  Review of Systems   Review of Systems  Constitutional:  Negative for activity change, chills and fever.  Eyes:  Negative for visual disturbance.  Respiratory:  Negative for shortness of breath.   Cardiovascular:  Positive for chest pain. Negative for palpitations.  Gastrointestinal:  Negative for abdominal pain, nausea and vomiting.  Musculoskeletal:  Negative for neck pain and neck stiffness.  Neurological:  Positive for weakness and numbness. Negative for dizziness, facial asymmetry, speech difficulty, light-headedness and headaches.  All other systems reviewed  and are negative.  Physical Exam Updated Vital Signs BP 134/82    Pulse 68    Temp 97.8 F (36.6 C) (Oral)    Resp 17    Ht 5\' 10"  (1.778 m)    Wt 104.3 kg    LMP 03/06/2021 (Exact Date)    SpO2 100%    BMI 33.00 kg/m   Physical Exam Vitals and nursing note reviewed.  Constitutional:      General: She is not in acute distress.    Appearance: Normal appearance. She is not ill-appearing.  HENT:     Head: Normocephalic and atraumatic.     Nose: Nose normal.  Eyes:     General: No visual field deficit or scleral icterus.    Extraocular Movements: Extraocular movements intact.     Conjunctiva/sclera: Conjunctivae normal.  Cardiovascular:     Rate and Rhythm: Normal rate and regular rhythm.     Pulses: Normal pulses.     Heart sounds: Normal heart sounds.  Pulmonary:     Effort: Pulmonary effort is normal. No respiratory distress.     Breath sounds: Normal breath sounds. No wheezing or rales.  Abdominal:     General: There is no distension.     Tenderness: There is no abdominal tenderness.  Musculoskeletal:        General: Normal range of motion.     Cervical back: Normal range of motion.  Skin:    General: Skin is warm and dry.  Neurological:     General: No focal deficit present.     Mental Status: She is alert. Mental status is at baseline.     Cranial Nerves: Cranial nerves 2-12 are intact. No dysarthria.     Sensory: Sensory deficit present.     Motor: Weakness (Left upper extremity) and pronator drift present.     Coordination: Finger-Nose-Finger Test normal.     Comments: Face is symmetrical.  Without dysarthria.  Left upper extremity with 4/5 strength.  Right upper extremity with 5/5 strength.  Left-sided face and left upper extremity with decrease in sensation.  Mild pronator drift noted in left upper extremity. Finger-nose intact.  Bilateral lower extremity with 5/5 strength, and symmetrical sensation.    ED Results / Procedures / Treatments   Labs (all labs ordered  are listed, but only abnormal results are displayed) Labs Reviewed  RESP PANEL BY RT-PCR (FLU A&B, COVID) ARPGX2  CBC  ETHANOL  PROTIME-INR  APTT  COMPREHENSIVE METABOLIC PANEL  RAPID URINE DRUG SCREEN, HOSP PERFORMED  URINALYSIS, ROUTINE W REFLEX MICROSCOPIC  POC URINE PREG, ED  TROPONIN I (HIGH SENSITIVITY)    EKG None  Radiology No results found.  Procedures Procedures   Medications Ordered in ED Medications - No data to display  ED Course  I have reviewed the triage vital signs and the nursing notes.  Pertinent labs &  imaging results that were available during my care of the patient were reviewed by me and considered in my medical decision making (see chart for details).    MDM Rules/Calculators/A&P                         45 year old female presents today for evaluation of left upper extremity numbness and heaviness onset prior to 7 AM this morning.  On arrival patient was little over 3-1/2 hours and an outside of the acute stroke window.  Patient's initial work-up significant for hemoglobin 11.2, calcium 8.8, small amounts of hemoglobin and UA otherwise unremarkable.  Initial troponin of 2.  EKG without acute ischemic changes.  Respiratory panel negative for COVID or flu.  CT head without acute intracranial findings.  On reevaluation patient remains with left upper extremity weakness.  Will discuss case with neurology.  Case discussed with neurology who recommends admission for TIA work-up and aspirin 325 now and 81 mg daily.  Neurology placed phone consult for further recommendations.  MRI ordered.  Case discussed with hospitalist will evaluate patient for admission.     Final Clinical Impression(s) / ED Diagnoses Final diagnoses:  Stroke-like symptoms    Rx / DC Orders ED Discharge Orders     None        Evlyn Courier, PA-C 03/23/21 1441    [place], Wonda Olds, MD 03/28/21 [phone]

## 2021-03-24 ENCOUNTER — Observation Stay (HOSPITAL_BASED_OUTPATIENT_CLINIC_OR_DEPARTMENT_OTHER): Payer: BC Managed Care – PPO

## 2021-03-24 ENCOUNTER — Observation Stay (HOSPITAL_COMMUNITY)
Admit: 2021-03-24 | Discharge: 2021-03-24 | Disposition: A | Discharge status: Home / Self Care | Payer: BC Managed Care – PPO | Attending: Neurology | Admitting: Neurology

## 2021-03-24 ENCOUNTER — Observation Stay (HOSPITAL_COMMUNITY): Payer: BC Managed Care – PPO

## 2021-03-24 DIAGNOSIS — M5412 Radiculopathy, cervical region: Secondary | ICD-10-CM

## 2021-03-24 DIAGNOSIS — E7800 Pure hypercholesterolemia, unspecified: Secondary | ICD-10-CM | POA: Diagnosis not present

## 2021-03-24 DIAGNOSIS — R2990 Unspecified symptoms and signs involving the nervous system: Secondary | ICD-10-CM | POA: Diagnosis not present

## 2021-03-24 DIAGNOSIS — R29898 Other symptoms and signs involving the musculoskeletal system: Secondary | ICD-10-CM

## 2021-03-24 DIAGNOSIS — M7989 Other specified soft tissue disorders: Secondary | ICD-10-CM | POA: Diagnosis not present

## 2021-03-24 LAB — ECHOCARDIOGRAM COMPLETE BUBBLE STUDY
AR max vel: 2.95 cm2
AV Area VTI: 2.68 cm2
AV Area mean vel: 2.64 cm2
AV Mean grad: 5 mmHg
AV Peak grad: 8.3 mmHg
Ao pk vel: 1.44 m/s
Area-P 1/2: 3.15 cm2
Calc EF: 63 %
MV VTI: 2.77 cm2
S' Lateral: 3.4 cm
Single Plane A2C EF: 59.1 %
Single Plane A4C EF: 67.2 %

## 2021-03-24 LAB — LIPID PANEL
Cholesterol: 197 mg/dL (ref 0–200)
HDL: 37 mg/dL — ABNORMAL LOW (ref >40)
LDL Cholesterol: 142 mg/dL — ABNORMAL HIGH (ref 0–99)
Total CHOL/HDL Ratio: 5.3 RATIO
Triglycerides: 89 mg/dL (ref <150)
VLDL: 18 mg/dL (ref 0–40)

## 2021-03-24 LAB — VITAMIN B12: Vitamin B-12: 184 pg/mL (ref 180–914)

## 2021-03-24 LAB — HEMOGLOBIN A1C
Hgb A1c MFr Bld: 5.7 % — ABNORMAL HIGH (ref 4.8–5.6)
Mean Plasma Glucose: 116.89 mg/dL

## 2021-03-24 LAB — HIV ANTIBODY (ROUTINE TESTING W REFLEX): HIV Screen 4th Generation wRfx: NONREACTIVE

## 2021-03-24 IMAGING — US US EXTREM  UP VENOUS*L*
1 series · 13 of 24 positions shown · non-contrast
Comparison: None.

CLINICAL DATA: Swelling



[Series 1: us venous img upper uni left (dvt) · portal-venous · 13 of 43 slices shown]
[im 1/43]
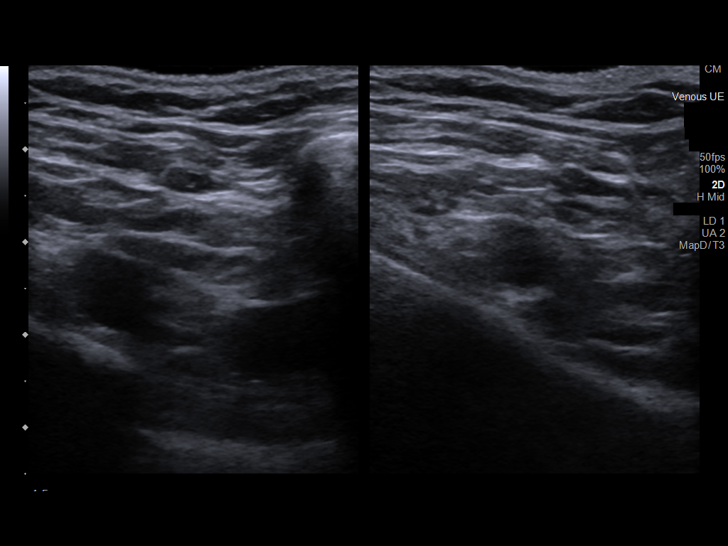
[im 4/43]
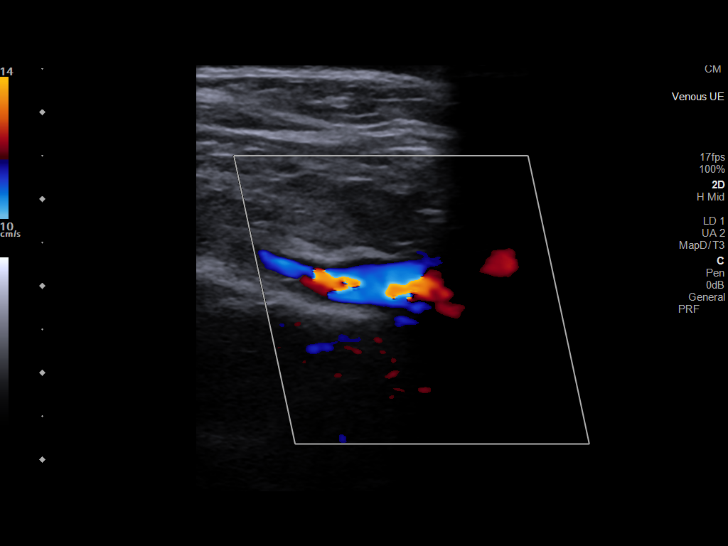
[im 8/43]
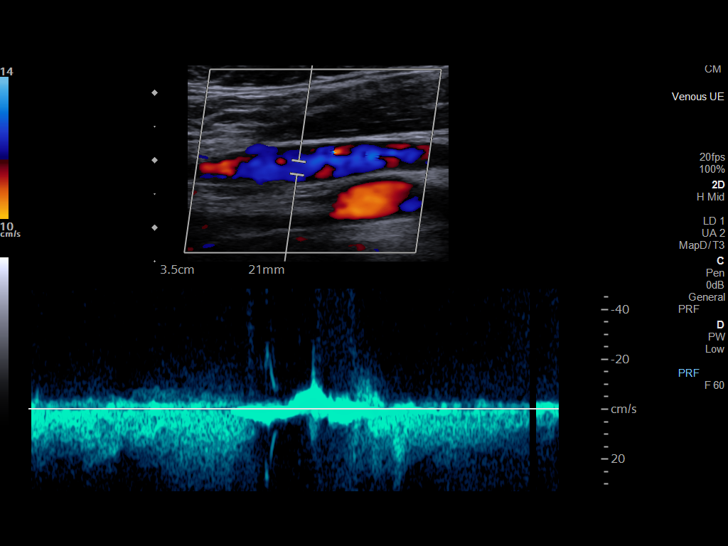
[im 11/43]
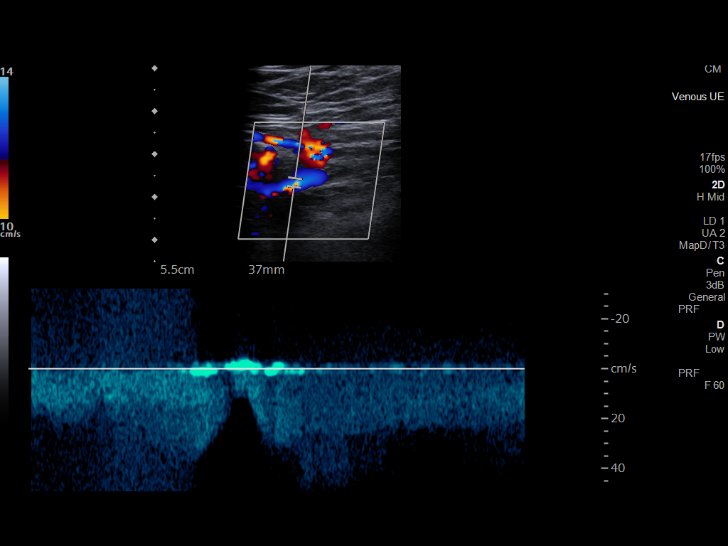
[im 15/43]
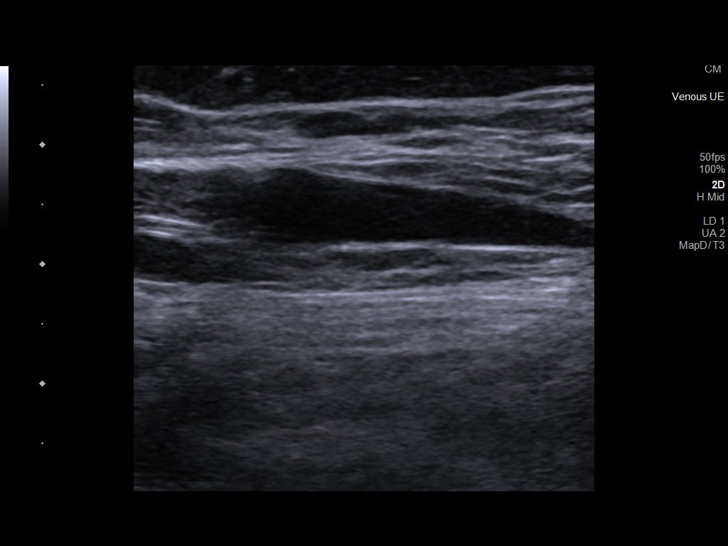
[im 19/43]
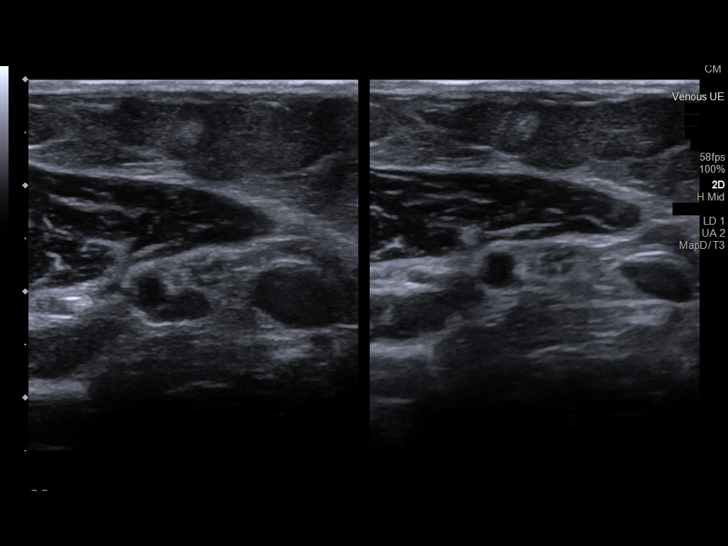
[im 22/43]
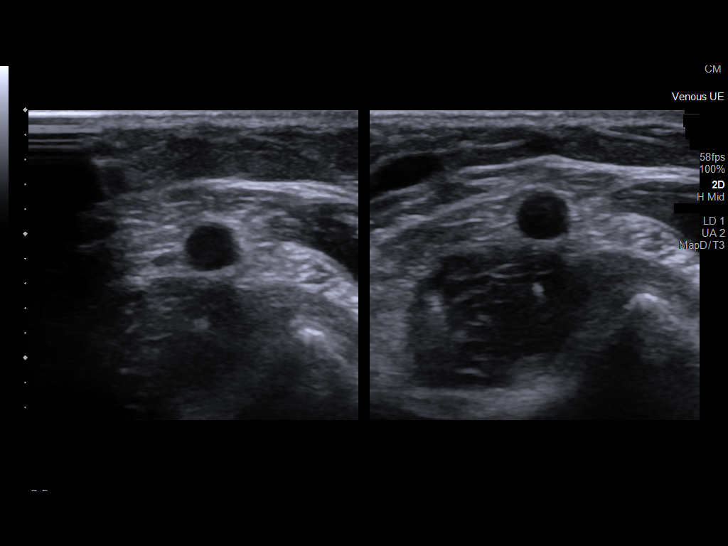
[im 24/43]
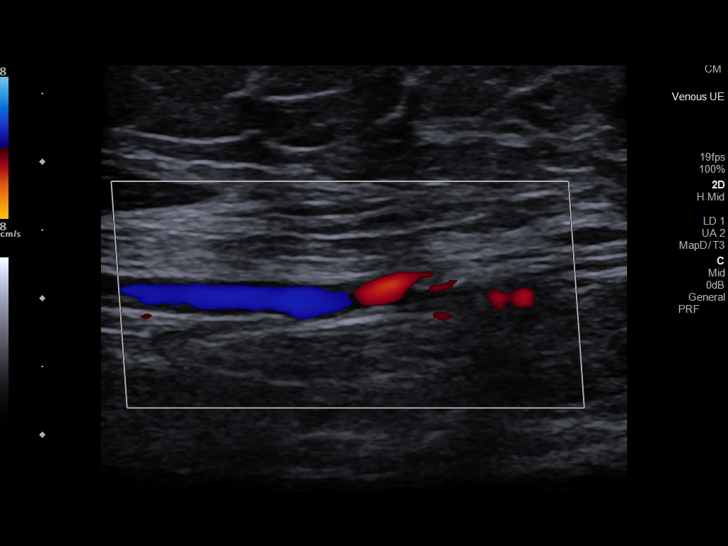
[im 28/43]
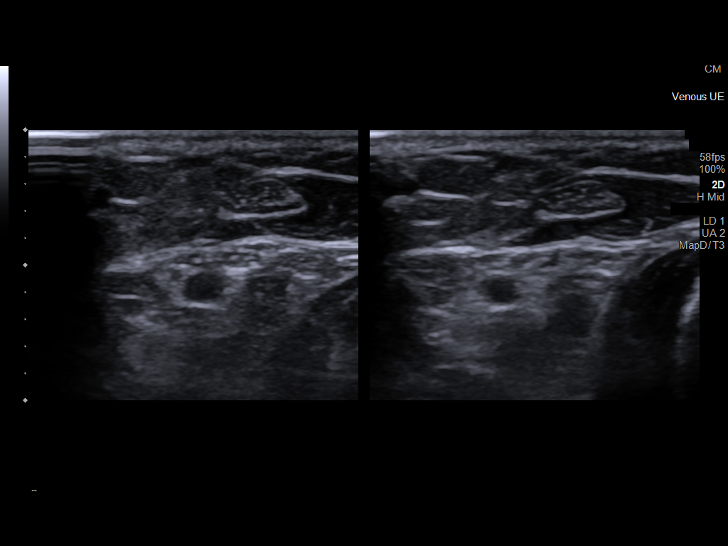
[im 32/43]
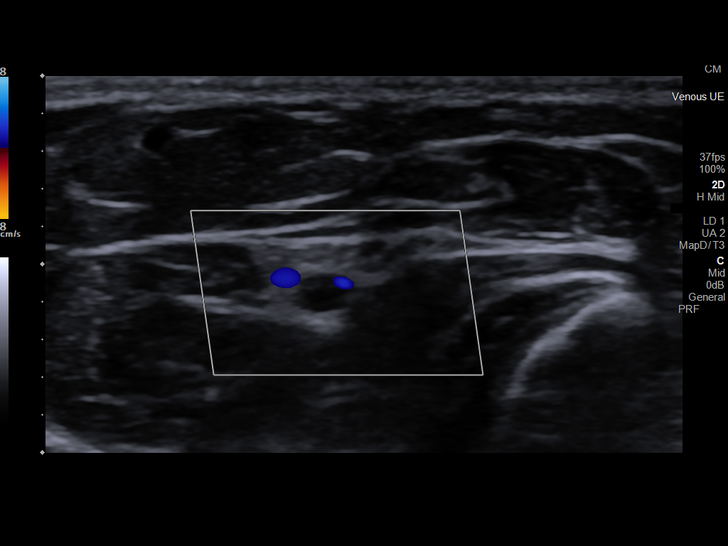
[im 35/43]
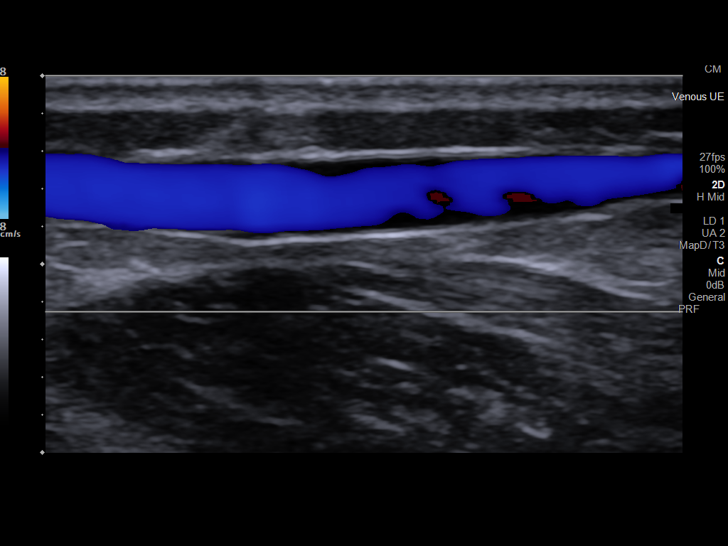
[im 39/43]
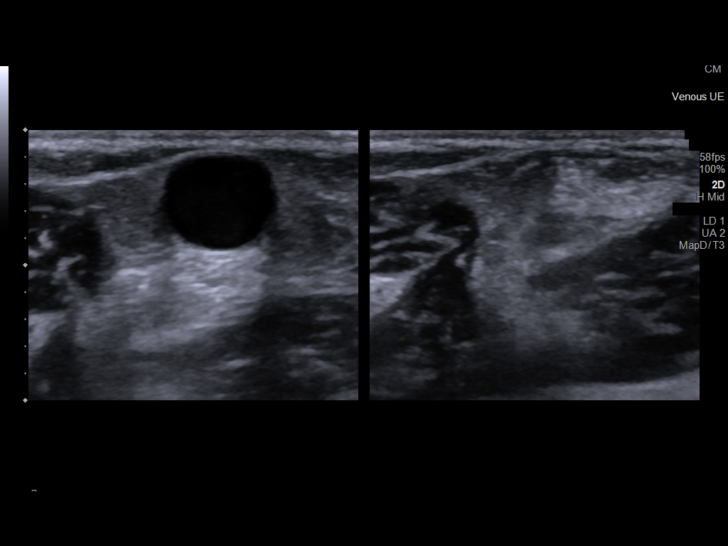
[im 43/43]
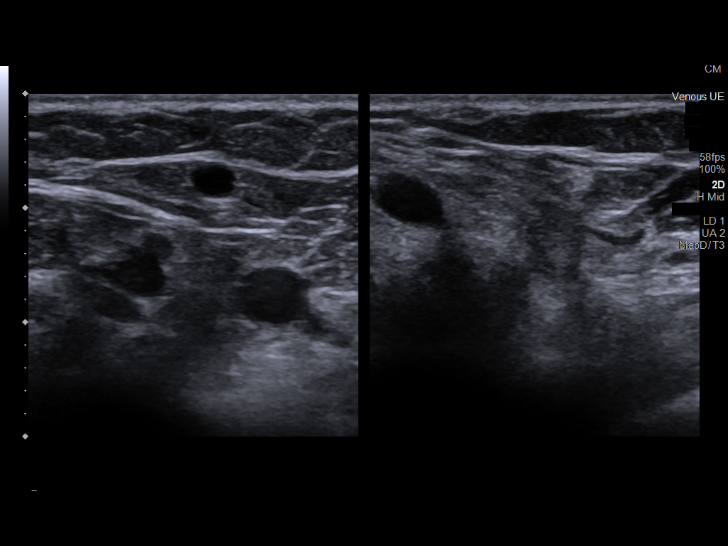

[13 of 24 positions shown; findings below may reference images not displayed]

FINDINGS: Contralateral Subclavian Vein: Respiratory phasicity is normal and
symmetric with the symptomatic side. No evidence of thrombus. Normal
compressibility.

Internal Jugular Vein: No evidence of thrombus. Normal
compressibility, respiratory phasicity and response to augmentation.

Subclavian Vein: No evidence of thrombus. Normal compressibility,
respiratory phasicity and response to augmentation.

Axillary Vein: No evidence of thrombus. Normal compressibility,
respiratory phasicity and response to augmentation.

Cephalic Vein: No evidence of thrombus. Normal compressibility,
respiratory phasicity and response to augmentation.

Basilic Vein: No evidence of thrombus. Normal compressibility,
respiratory phasicity and response to augmentation.

Brachial Veins: No evidence of thrombus. Normal compressibility,
respiratory phasicity and response to augmentation.

Radial Veins: No evidence of thrombus. Normal compressibility,
respiratory phasicity and response to augmentation.

Ulnar Veins: No evidence of thrombus. Normal compressibility,
respiratory phasicity and response to augmentation.

Venous Reflux:  None visualized.

Other Findings:  None visualized.
IMPRESSION: No evidence of DVT within the left upper extremity.

## 2021-03-24 MED ORDER — METHYLPREDNISOLONE SODIUM SUCC 125 MG IJ SOLR
60.0000 mg | Freq: Every day | INTRAMUSCULAR | Status: DC
  Administered 2021-03-24: 16:00:00 60 mg via INTRAVENOUS
  Filled 2021-03-24: qty 2

## 2021-03-24 MED ORDER — PREDNISONE 20 MG PO TABS: 40.0000 mg | ORAL_TABLET | Freq: Every day | ORAL | 0 refills | Status: AC

## 2021-03-24 NOTE — Progress Notes (Signed)
Nsg Discharge Note  Admit Date:  03/23/2021 Discharge date: 03/24/2021   [facility] to be D/C'd Home per MD order.  AVS completed Patient/caregiver able to verbalize understanding.  Discharge Medication: Allergies as of 03/24/2021       Reactions   Clindamycin/lincomycin Other (See Comments)   "stuck in throat" cannot take pill form; can take as liquid   Penicillins Swelling, Rash   Childhood reaction and family history  Has patient had a PCN reaction causing immediate rash, facial/tongue/throat swelling, SOB or lightheadedness with hypotension: Unknown Has patient had a PCN reaction causing severe rash involving mucus membranes or skin necrosis: Unknown Has patient had a PCN reaction that required hospitalization: Unknown Has patient had a PCN reaction occurring within the last 10 years: No If all of the above answers are "NO", then may proceed with Cephalosporin use.        Medication List     TAKE these medications    atorvastatin 40 MG tablet Commonly known as: LIPITOR Take 1 tablet (40 mg total) by mouth daily.   fluticasone 50 MCG/ACT nasal spray Commonly known as: FLONASE Place 2 sprays into both nostrils daily.   loratadine 10 MG tablet Commonly known as: CLARITIN Take 10 mg by mouth daily.   omeprazole 20 MG capsule Commonly known as: PRILOSEC Take 20 mg by mouth 2 (two) times daily before a meal.   predniSONE 20 MG tablet Commonly known as: DELTASONE Take 2 tablets (40 mg total) by mouth daily for 7 days.        Discharge Assessment: Vitals:   03/24/21 1236 03/24/21 1506  BP: (!) 104/55 124/74  Pulse: 69 62  Resp: 18 18  Temp: 98 F (36.7 C) 97.7 F (36.5 C)  SpO2: 100% 98%   Skin clean, dry and intact without evidence of skin break down, no evidence of skin tears noted. IV catheter discontinued intact. Site without signs and symptoms of complications - no redness or edema noted at insertion site, patient denies c/o pain - only slight  tenderness at site.  Dressing with slight pressure applied.  D/c Instructions-Education: Discharge instructions given to patient/family with verbalized understanding. D/c education completed with patient/family including follow up instructions, medication list, d/c activities limitations if indicated, with other d/c instructions as indicated by MD - patient able to verbalize understanding, all questions fully answered. Patient instructed to return to ED, call 911, or call MD for any changes in condition.  Patient escorted via [place], and D/C home via private auto.  Kathie Rhodes, RN 03/24/2021 6:02 PM

## 2021-03-24 NOTE — Discharge Summary (Signed)
Physician Discharge Summary  Emily Boone DXI:[id number] DOB: Aug 16, 1975 DOA: 03/23/2021  PCP: Baruch Gouty, FNP  Admit date: 03/23/2021 Discharge date: 03/24/2021  Admitted From: Home Disposition: Home  Recommendations for Outpatient Follow-up:  Follow up with PCP in 1-2 weeks Please obtain BMP/CBC in one week Follow-up with neurosurgery, Dr. Ronnald Ramp as an outpatient  Home Health: Equipment/Devices:  Discharge Condition: Stable CODE STATUS: Full code Diet recommendation: Heart healthy  Brief/Interim Summary: 45 year old female with a history of hyperlipidemia, was brought to the emergency room with left arm numbness and weakness.  Symptoms began on the morning of admission.  Reports similar episodes occurring a couple times a month which usually last 15 minutes and are self-limited.  Since her symptoms persisted, she came to the ER for evaluation.  MRI of the brain did not show any acute infarct.  CTA of the head and neck was also unrevealing.  EEG also did not show any acute findings.  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  Case was discussed with both neurology, Dr. Hortense Ramal and neurosurgery, Dr. Ronnald Ramp.  It was felt that her foraminal stenosis could certainly be causing her symptoms.  Recommendations were to start the patient a course of steroids and follow-up with Dr. Ronnald Ramp in the outpatient setting.  Patient has received information to schedule follow-up appointment.  She will be prescribed a course of prednisone.  She is otherwise stable for discharge.  Discharge Diagnoses:  Principal Problem:   Left arm weakness Active Problems:   GERD without esophagitis   Pure hypercholesterolemia   Cervical radiculopathy    Discharge Instructions  Discharge Instructions     Diet - low sodium heart healthy   Complete by: As directed    Increase activity slowly   Complete by: As directed       Allergies as of 03/24/2021        Reactions   Clindamycin/lincomycin Other (See Comments)   "stuck in throat" cannot take pill form; can take as liquid   Penicillins Swelling, Rash   Childhood reaction and family history  Has patient had a PCN reaction causing immediate rash, facial/tongue/throat swelling, SOB or lightheadedness with hypotension: Unknown Has patient had a PCN reaction causing severe rash involving mucus membranes or skin necrosis: Unknown Has patient had a PCN reaction that required hospitalization: Unknown Has patient had a PCN reaction occurring within the last 10 years: No If all of the above answers are "NO", then may proceed with Cephalosporin use.        Medication List     TAKE these medications    atorvastatin 40 MG tablet Commonly known as: LIPITOR Take 1 tablet (40 mg total) by mouth daily.   fluticasone 50 MCG/ACT nasal spray Commonly known as: FLONASE Place 2 sprays into both nostrils daily.   loratadine 10 MG tablet Commonly known as: CLARITIN Take 10 mg by mouth daily.   omeprazole 20 MG capsule Commonly known as: PRILOSEC Take 20 mg by mouth 2 (two) times daily before a meal.   predniSONE 20 MG tablet Commonly known as: DELTASONE Take 2 tablets (40 mg total) by mouth daily for 7 days.        Follow-up Information     Eustace Moore, MD Follow up.   Specialty: Neurosurgery Why: call for appointment in next 1-2 weeks Contact information: [address] [place] [address] [phone]  Allergies  Allergen Reactions   Clindamycin/Lincomycin Other (See Comments)    "stuck in throat" cannot take pill form; can take as liquid   Penicillins Swelling and Rash    Childhood reaction and family history  Has patient had a PCN reaction causing immediate rash, facial/tongue/throat swelling, SOB or lightheadedness with hypotension: Unknown Has patient had a PCN reaction causing severe rash involving mucus membranes or skin  necrosis: Unknown Has patient had a PCN reaction that required hospitalization: Unknown Has patient had a PCN reaction occurring within the last 10 years: No If all of the above answers are "NO", then may proceed with Cephalosporin use.     Consultations:    Procedures/Studies: CT ANGIO HEAD NECK W WO CM  Result Date: 03/23/2021 CLINICAL DATA:  Initial evaluation for neuro deficit, stroke suspected, left upper extremity numbness.// EXAM: CT ANGIOGRAPHY HEAD AND NECK TECHNIQUE: Multidetector CT imaging of the head and neck was performed using the standard protocol during bolus administration of intravenous contrast. Multiplanar CT image reconstructions and MIPs were obtained to evaluate the vascular anatomy. Carotid stenosis measurements (when applicable) are obtained utilizing NASCET criteria, using the distal internal carotid diameter as the denominator. CONTRAST:  40mL OMNIPAQUE IOHEXOL 350 MG/ML SOLN COMPARISON:  Comparison made with prior CT and MRI from earlier the same day. FINDINGS: CTA NECK FINDINGS Aortic arch: Visualized aortic arch normal caliber with normal 3 vessel morphology. No stenosis about the origin of the great vessels. Right carotid system: Right common and internal carotid arteries widely patent without stenosis, dissection or occlusion. Left carotid system: Left common and internal carotid arteries widely patent without stenosis, dissection or occlusion. Minimal plaque at the left carotid bifurcation without stenosis. Vertebral arteries: Both vertebral arteries arise from the subclavian arteries. No proximal subclavian artery stenosis. Both vertebral arteries widely patent without stenosis, dissection or occlusion. Skeleton: No worrisome lytic or blastic osseous lesions. Subcentimeter sclerotic lesion within the C5 vertebral body likely reflects a benign bone island. Few scattered dental caries noted. Other neck: No other acute soft tissue abnormality within the neck. 8 mm left  thyroid nodule noted, of doubtful significance given size and patient age, no follow-up imaging recommended (ref: J Am Coll Radiol. 2015 Feb;12(2): 143-50). Upper chest: Visualized upper chest demonstrates no acute finding. Few scattered calcified granulomata noted. Review of the MIP images confirms the above findings CTA HEAD FINDINGS Anterior circulation: Both internal carotid arteries widely patent to the termini without stenosis. A1 segments widely patent. Normal anterior communicating artery complex. Both anterior cerebral arteries widely patent to their distal aspects without stenosis. No M1 stenosis or occlusion. Normal MCA bifurcations. Distal MCA branches well perfused and symmetric. Posterior circulation: Both V4 segments patent to the vertebrobasilar junction without stenosis. Both PICA origins patent and normal. Basilar widely patent to its distal aspect without stenosis. Superior cerebellar arteries patent bilaterally. Both PCAs primarily supplied via the basilar and are well perfused to there distal aspects. Venous sinuses: Patent allowing for timing the contrast bolus. Anatomic variants: None significant.  No aneurysm. Review of the MIP images confirms the above findings IMPRESSION: Normal CTA of the head and neck. No large vessel occlusion, hemodynamically significant stenosis, or other acute vascular abnormality. Electronically Signed   By: Jeannine Boga M.D.   On: 03/23/2021 21:45   CT Head Wo Contrast  Result Date: 03/23/2021 CLINICAL DATA:  Headache, new or worsening (Age >= 50y) EXAM: CT HEAD WITHOUT CONTRAST TECHNIQUE: Contiguous axial images were obtained from the base of the skull through  the vertex without intravenous contrast. COMPARISON:  None. FINDINGS: Brain: There is no acute intracranial hemorrhage, mass effect, or edema. Gray-white differentiation is preserved. There is no extra-axial fluid collection. Ventricles and sulci are within normal limits in size and  configuration. Vascular: No hyperdense vessel or unexpected calcification. Skull: Calvarium is unremarkable. Sinuses/Orbits: No acute finding. Other: Mastoid air cells are clear. IMPRESSION: No acute intracranial abnormality. Electronically Signed   By: Macy Mis M.D.   On: 03/23/2021 12:42   MR BRAIN WO CONTRAST  Result Date: 03/23/2021 CLINICAL DATA:  Headache.  Left arm weakness.  Rule out CVA EXAM: MRI HEAD WITHOUT CONTRAST TECHNIQUE: Multiplanar, multiecho pulse sequences of the brain and surrounding structures were obtained without intravenous contrast. COMPARISON:  CT head 03/23/2021 FINDINGS: Brain: No acute infarction, hemorrhage, hydrocephalus, extra-axial collection or mass lesion. Vascular: Normal arterial flow voids at the skull base Skull and upper cervical spine: No focal skeletal abnormality. Sinuses/Orbits: Negative Other: None IMPRESSION: Negative MRI head without contrast. Electronically Signed   By: Franchot Gallo M.D.   On: 03/23/2021 15:00   MR CERVICAL SPINE WO CONTRAST  Result Date: 03/23/2021 CLINICAL DATA:  Left arm weakness and numbness. Rule out compression fracture cervical spine EXAM: MRI CERVICAL SPINE WITHOUT CONTRAST TECHNIQUE: Multiplanar, multisequence MR imaging of the cervical spine was performed. No intravenous contrast was administered. COMPARISON:  None. FINDINGS: Alignment: Normal Vertebrae: Negative for fracture or mass.  No bone marrow edema Cord: Normal signal and morphology Posterior Fossa, vertebral arteries, paraspinal tissues: Negative Disc levels: C2-3: Small central disc protrusion.  Negative for stenosis C3-4: Moderate left foraminal narrowing due to uncinate spurring and facet hypertrophy. Spinal canal normal in size C4-5: Moderate left foraminal narrowing due to uncinate spurring and facet degeneration. Spinal canal adequate in size C5-6: Moderate left foraminal narrowing due to uncinate spurring. Mild central canal stenosis. C6-7: Mild left  foraminal narrowing due to spurring. C7-T1: Negative IMPRESSION: Negative for fracture Left foraminal narrowing C3-4, C4-5, C5-6, and C6-7 due to asymmetric spurring. Electronically Signed   By: Franchot Gallo M.D.   On: 03/23/2021 15:03   US Venous Img Upper Uni Left (DVT)  Result Date: 03/24/2021 CLINICAL DATA:  Swelling EXAM: LEFT UPPER EXTREMITY VENOUS DOPPLER ULTRASOUND TECHNIQUE: Gray-scale sonography with graded compression, as well as color Doppler and duplex ultrasound were performed to evaluate the upper extremity deep venous system from the level of the subclavian vein and including the jugular, axillary, basilic, radial, ulnar and upper cephalic vein. Spectral Doppler was utilized to evaluate flow at rest and with distal augmentation maneuvers. COMPARISON:  None. FINDINGS: Contralateral Subclavian Vein: Respiratory phasicity is normal and symmetric with the symptomatic side. No evidence of thrombus. Normal compressibility. Internal Jugular Vein: No evidence of thrombus. Normal compressibility, respiratory phasicity and response to augmentation. Subclavian Vein: No evidence of thrombus. Normal compressibility, respiratory phasicity and response to augmentation. Axillary Vein: No evidence of thrombus. Normal compressibility, respiratory phasicity and response to augmentation. Cephalic Vein: No evidence of thrombus. Normal compressibility, respiratory phasicity and response to augmentation. Basilic Vein: No evidence of thrombus. Normal compressibility, respiratory phasicity and response to augmentation. Brachial Veins: No evidence of thrombus. Normal compressibility, respiratory phasicity and response to augmentation. Radial Veins: No evidence of thrombus. Normal compressibility, respiratory phasicity and response to augmentation. Ulnar Veins: No evidence of thrombus. Normal compressibility, respiratory phasicity and response to augmentation. Venous Reflux:  None visualized. Other Findings:  None  visualized. IMPRESSION: No evidence of DVT within the left upper extremity. Electronically Signed  By: Lucrezia Europe M.D.   On: 03/24/2021 10:09   EEG adult  Result Date: 03/24/2021 Lora Havens, MD     03/24/2021  5:45 PM Patient Name: Emily Boone MRN: [id number] Epilepsy Attending: Lora Havens Referring Physician/Provider: Kathie Dike, MD Date: 03/24/2021 Duration: 22.50 mins Patient history: 45yo f with left arm numbness. EEG to evaluate for seizure Level of alertness: Awake, asleep AEDs during EEG study: None Technical aspects: This EEG study was done with scalp electrodes positioned according to the 10-20 International system of electrode placement. Electrical activity was acquired at a sampling rate of 500Hz  and reviewed with a high frequency filter of 70Hz  and a low frequency filter of 1Hz . EEG data were recorded continuously and digitally stored. Description: The posterior dominant rhythm consists of 10 Hz activity of moderate voltage (25-35 uV) seen predominantly in posterior head regions, symmetric and reactive to eye opening and eye closing. Sleep was characterized by vertex waves, sleep spindles (12 to 14 Hz), maximal frontocentral region. Hyperventilation did not show any EEG change.  Physiologic photic driving was seen during photic stimulation. IMPRESSION: This study is within normal limits. No seizures or epileptiform discharges were seen throughout the recording. Lora Havens   MM 3D SCREEN BREAST BILATERAL  Result Date: 03/02/2021 CLINICAL DATA:  Screening. EXAM: DIGITAL SCREENING BILATERAL MAMMOGRAM WITH TOMOSYNTHESIS AND CAD TECHNIQUE: Bilateral screening digital craniocaudal and mediolateral oblique mammograms were obtained. Bilateral screening digital breast tomosynthesis was performed. The images were evaluated with computer-aided detection. COMPARISON:  Previous exam(s). ACR Breast Density Category b: There are scattered areas of fibroglandular density. FINDINGS:  There are no findings suspicious for malignancy. IMPRESSION: No mammographic evidence of malignancy. A result letter of this screening mammogram will be mailed directly to the patient. RECOMMENDATION: Screening mammogram in one year. (Code:SM-B-01Y) BI-RADS CATEGORY  1: Negative. Electronically Signed   By: Marin Olp M.D.   On: 03/02/2021 11:25   ECHOCARDIOGRAM COMPLETE BUBBLE STUDY  Result Date: 03/24/2021    ECHOCARDIOGRAM REPORT   Patient Name:   Emily Boone Date of Exam: 03/24/2021 Emily Rec #:  [id number]      Height:       70.0 in Accession #:    [id number]     Weight:       230.0 lb Date of Birth:  1975/06/13      BSA:          2.215 m Patient Age:    45 years       BP:           127/82 mmHg Patient Gender: F              HR:           62 bpm. Exam Location:  [facility] Procedure: 2D Echo, Cardiac Doppler and Color Doppler Indications:   Stroke  History:       Patient has no prior history of Echocardiogram examinations.                Stroke.  Sonographer:   Wenda Low Referring      [place] Phys: IMPRESSIONS  1. Left ventricular ejection fraction, by estimation, is 60 to 65%. The left ventricle has normal function. The left ventricle has no regional wall motion abnormalities. The left ventricular internal cavity size was mildly dilated. Left ventricular diastolic parameters were normal.  2. Right ventricular systolic function is normal. The right ventricular size is normal.  3. The mitral valve is normal  in structure. Trivial mitral valve regurgitation.  4. The aortic valve is normal in structure. Aortic valve regurgitation is not visualized.  5. Agitated saline contrast bubble study was negative, with no evidence of any interatrial shunt. FINDINGS  Left Ventricle: Left ventricular ejection fraction, by estimation, is 60 to 65%. The left ventricle has normal function. The left ventricle has no regional wall motion abnormalities. The left ventricular internal cavity size was  mildly dilated. There is  no left ventricular hypertrophy. Left ventricular diastolic parameters were normal. Right Ventricle: The right ventricular size is normal. Right vetricular wall thickness was not assessed. Right ventricular systolic function is normal. Left Atrium: Left atrial size was normal in size. Right Atrium: Right atrial size was normal in size. Pericardium: There is no evidence of pericardial effusion. Mitral Valve: The mitral valve is normal in structure. Trivial mitral valve regurgitation. MV peak gradient, 6.6 mmHg. The mean mitral valve gradient is 2.0 mmHg. Tricuspid Valve: The tricuspid valve is normal in structure. Tricuspid valve regurgitation is not demonstrated. Aortic Valve: The aortic valve is normal in structure. Aortic valve regurgitation is not visualized. Aortic valve mean gradient measures 5.0 mmHg. Aortic valve peak gradient measures 8.3 mmHg. Aortic valve area, by VTI measures 2.68 cm. Pulmonic Valve: The pulmonic valve was normal in structure. Pulmonic valve regurgitation is not visualized. Aorta: The aortic root is normal in size and structure. IAS/Shunts: No atrial level shunt detected by color flow Doppler. Agitated saline contrast was given intravenously to evaluate for intracardiac shunting. Agitated saline contrast bubble study was negative, with no evidence of any interatrial shunt.  LEFT VENTRICLE PLAX 2D LVIDd:         5.20 cm     Diastology LVIDs:         3.40 cm     LV e' medial:    8.49 cm/s LV PW:         1.10 cm     LV E/e' medial:  14.1 LV IVS:        1.10 cm     LV e' lateral:   11.10 cm/s LVOT diam:     2.00 cm     LV E/e' lateral: 10.8 LV SV:         95 LV SV Index:   43 LVOT Area:     3.14 cm  LV Volumes (MOD) LV vol d, MOD A2C: 83.2 ml LV vol d, MOD A4C: 75.5 ml LV vol s, MOD A2C: 34.0 ml LV vol s, MOD A4C: 24.8 ml LV SV MOD A2C:     49.2 ml LV SV MOD A4C:     75.5 ml LV SV MOD BP:      49.9 ml RIGHT VENTRICLE RV Basal diam:  3.10 cm RV Mid diam:    3.10 cm  RV S prime:     14.60 cm/s TAPSE (M-mode): 2.4 cm LEFT ATRIUM             Index        RIGHT ATRIUM           Index LA diam:        4.00 cm 1.81 cm/m   RA Area:     15.20 cm LA Vol (A2C):   73.3 ml 33.09 ml/m  RA Volume:   37.90 ml  17.11 ml/m LA Vol (A4C):   47.5 ml 21.44 ml/m LA Biplane Vol: 60.6 ml 27.36 ml/m  AORTIC VALVE  PULMONIC VALVE AV Area (Vmax):    2.95 cm      PV Vmax:       0.87 m/s AV Area (Vmean):   2.64 cm      PV Peak grad:  3.0 mmHg AV Area (VTI):     2.68 cm AV Vmax:           144.00 cm/s AV Vmean:          100.000 cm/s AV VTI:            0.355 m AV Peak Grad:      8.3 mmHg AV Mean Grad:      5.0 mmHg LVOT Vmax:         135.00 cm/s LVOT Vmean:        83.900 cm/s LVOT VTI:          0.303 m LVOT/AV VTI ratio: 0.85  AORTA Ao Root diam: 3.30 cm MITRAL VALVE MV Area (PHT): 3.15 cm     SHUNTS MV Area VTI:   2.77 cm     Systemic VTI:  0.30 m MV Peak grad:  6.6 mmHg     Systemic Diam: 2.00 cm MV Mean grad:  2.0 mmHg MV Vmax:       1.28 m/s MV Vmean:      64.8 cm/s MV Decel Time: 241 msec MV E velocity: 120.00 cm/s MV A velocity: 66.40 cm/s MV E/A ratio:  1.81 Dorris Carnes MD Electronically signed by Dorris Carnes MD Signature Date/Time: 03/24/2021/2:44:59 PM    Final       Subjective: Feels the left upper extremity weakness and numbness improving  Discharge Exam: Vitals:   03/24/21 0737 03/24/21 1236 03/24/21 1506 03/24/21 1857  BP: 127/82 (!) 104/55 124/74 123/77  Pulse: 62 69 62 78  Resp: 18 18 18 18   Temp:  98 F (36.7 C) 97.7 F (36.5 C) 98.3 F (36.8 C)  TempSrc:  Oral  Oral  SpO2: 98% 100% 98% 98%  Weight:      Height:        General: Pt is alert, awake, not in acute distress Cardiovascular: RRR, S1/S2 +, no rubs, no gallops Respiratory: CTA bilaterally, no wheezing, no rhonchi Abdominal: Soft, NT, ND, bowel sounds + Extremities: no edema, no cyanosis Neurology: Feels that left upper extremity weakness and numbness improving    The results of  significant diagnostics from this hospitalization (including imaging, microbiology, ancillary and laboratory) are listed below for reference.     Microbiology: Recent Results (from the past 240 hour(s))  Resp Panel by RT-PCR (Flu A&B, Covid) Nasopharyngeal Swab     Status: None   Collection Time: 03/23/21 11:40 AM   Specimen: Nasopharyngeal Swab; Nasopharyngeal(NP) swabs in vial transport medium  Result Value Ref Range Status   SARS Coronavirus 2 by RT PCR NEGATIVE NEGATIVE Final    Comment: (NOTE) SARS-CoV-2 target nucleic acids are NOT DETECTED.  The SARS-CoV-2 RNA is generally detectable in upper respiratory specimens during the acute phase of infection. The lowest concentration of SARS-CoV-2 viral copies this assay can detect is 138 copies/mL. A negative result does not preclude SARS-Cov-2 infection and should not be used as the sole basis for treatment or other patient management decisions. A negative result may occur with  improper specimen collection/handling, submission of specimen other than nasopharyngeal swab, presence of viral mutation(s) within the areas targeted by this assay, and inadequate number of viral copies(<138 copies/mL). A negative result must be combined with clinical observations, patient  history, and epidemiological information. The expected result is Negative.  Fact Sheet for Patients:  EntrepreneurPulse.com.au  Fact Sheet for Healthcare Providers:  IncredibleEmployment.be  This test is no t yet approved or cleared by the [place] FDA and  has been authorized for detection and/or diagnosis of SARS-CoV-2 by FDA under an Emergency Use Authorization (EUA). This EUA will remain  in effect (meaning this test can be used) for the duration of the COVID-19 declaration under Section 564(b)(1) of the Act, 21 U.S.C.section 360bbb-3(b)(1), unless the authorization is terminated  or revoked sooner.       Influenza A by  PCR NEGATIVE NEGATIVE Final   Influenza B by PCR NEGATIVE NEGATIVE Final    Comment: (NOTE) The Xpert Xpress SARS-CoV-2/FLU/RSV plus assay is intended as an aid in the diagnosis of influenza from Nasopharyngeal swab specimens and should not be used as a sole basis for treatment. Nasal washings and aspirates are unacceptable for Xpert Xpress SARS-CoV-2/FLU/RSV testing.  Fact Sheet for Patients: EntrepreneurPulse.com.au  Fact Sheet for Healthcare Providers: IncredibleEmployment.be  This test is not yet approved or cleared by the [place] FDA and has been authorized for detection and/or diagnosis of SARS-CoV-2 by FDA under an Emergency Use Authorization (EUA). This EUA will remain in effect (meaning this test can be used) for the duration of the COVID-19 declaration under Section 564(b)(1) of the Act, 21 U.S.C. section 360bbb-3(b)(1), unless the authorization is terminated or revoked.  Performed at [facility], [address], [place], [place] [address]      Labs: BNP (last 3 results) No results for input(s): BNP in the last 8760 hours. Basic Metabolic Panel: Recent Labs  Lab 03/23/21 1056  NA 137  K 4.0  CL 104  CO2 25  GLUCOSE 90  BUN 9  CREATININE 0.64  CALCIUM 8.8*   Liver Function Tests: Recent Labs  Lab 03/23/21 1056  AST 22  ALT 18  ALKPHOS 57  BILITOT 0.4  PROT 7.1  ALBUMIN 3.8   No results for input(s): LIPASE, AMYLASE in the last 168 hours. No results for input(s): AMMONIA in the last 168 hours. CBC: Recent Labs  Lab 03/23/21 1125  WBC 10.2  HGB 11.2*  HCT 35.4*  MCV 87.8  PLT 261   Cardiac Enzymes: No results for input(s): CKTOTAL, CKMB, CKMBINDEX, TROPONINI in the last 168 hours. BNP: Invalid input(s): POCBNP CBG: No results for input(s): GLUCAP in the last 168 hours. D-Dimer No results for input(s): DDIMER in the last 72 hours. Hgb A1c Recent Labs    03/24/21 0530  HGBA1C 5.7*   Lipid  Profile Recent Labs    03/24/21 0530  CHOL 197  HDL 37*  LDLCALC 142*  TRIG 89  CHOLHDL 5.3   Thyroid function studies No results for input(s): TSH, T4TOTAL, T3FREE, THYROIDAB in the last 72 hours.  Invalid input(s): FREET3 Anemia work up Recent Labs    03/24/21 0530  VITAMINB12 184   Urinalysis    Component Value Date/Time   COLORURINE YELLOW 03/23/2021 [place] 03/23/2021 1221   APPEARANCEUR Clear 06/18/2019 1139   LABSPEC 1.015 03/23/2021 1221   PHURINE 6.0 03/23/2021 1221   GLUCOSEU NEGATIVE 03/23/2021 1221   HGBUR SMALL (A) 03/23/2021 1221   BILIRUBINUR NEGATIVE 03/23/2021 1221   BILIRUBINUR Negative 06/18/2019 1139   KETONESUR NEGATIVE 03/23/2021 1221   PROTEINUR NEGATIVE 03/23/2021 1221   NITRITE NEGATIVE 03/23/2021 1221   LEUKOCYTESUR NEGATIVE 03/23/2021 1221   Sepsis Labs Invalid input(s): PROCALCITONIN,  WBC,  [place] Microbiology Recent Results (from the past 240 hour(s))  Resp Panel by RT-PCR (Flu A&B, Covid) Nasopharyngeal Swab     Status: None   Collection Time: 03/23/21 11:40 AM   Specimen: Nasopharyngeal Swab; Nasopharyngeal(NP) swabs in vial transport medium  Result Value Ref Range Status   SARS Coronavirus 2 by RT PCR NEGATIVE NEGATIVE Final    Comment: (NOTE) SARS-CoV-2 target nucleic acids are NOT DETECTED.  The SARS-CoV-2 RNA is generally detectable in upper respiratory specimens during the acute phase of infection. The lowest concentration of SARS-CoV-2 viral copies this assay can detect is 138 copies/mL. A negative result does not preclude SARS-Cov-2 infection and should not be used as the sole basis for treatment or other patient management decisions. A negative result may occur with  improper specimen collection/handling, submission of specimen other than nasopharyngeal swab, presence of viral mutation(s) within the areas targeted by this assay, and inadequate number of viral copies(<138 copies/mL). A negative  result must be combined with clinical observations, patient history, and epidemiological information. The expected result is Negative.  Fact Sheet for Patients:  EntrepreneurPulse.com.au  Fact Sheet for Healthcare Providers:  IncredibleEmployment.be  This test is no t yet approved or cleared by the [place] FDA and  has been authorized for detection and/or diagnosis of SARS-CoV-2 by FDA under an Emergency Use Authorization (EUA). This EUA will remain  in effect (meaning this test can be used) for the duration of the COVID-19 declaration under Section 564(b)(1) of the Act, 21 U.S.C.section 360bbb-3(b)(1), unless the authorization is terminated  or revoked sooner.       Influenza A by PCR NEGATIVE NEGATIVE Final   Influenza B by PCR NEGATIVE NEGATIVE Final    Comment: (NOTE) The Xpert Xpress SARS-CoV-2/FLU/RSV plus assay is intended as an aid in the diagnosis of influenza from Nasopharyngeal swab specimens and should not be used as a sole basis for treatment. Nasal washings and aspirates are unacceptable for Xpert Xpress SARS-CoV-2/FLU/RSV testing.  Fact Sheet for Patients: EntrepreneurPulse.com.au  Fact Sheet for Healthcare Providers: IncredibleEmployment.be  This test is not yet approved or cleared by the [place] FDA and has been authorized for detection and/or diagnosis of SARS-CoV-2 by FDA under an Emergency Use Authorization (EUA). This EUA will remain in effect (meaning this test can be used) for the duration of the COVID-19 declaration under Section 564(b)(1) of the Act, 21 U.S.C. section 360bbb-3(b)(1), unless the authorization is terminated or revoked.  Performed at [facility], [address] [place], [place] [address]      Time coordinating discharge: 32mins  SIGNED:   Kathie Dike, MD  [facility] 03/24/2021, 8:37 PM   If 7PM-7AM, please contact  night-coverage [email or web address]

## 2021-03-24 NOTE — Progress Notes (Signed)
EEG completed, results pending. 

## 2021-03-24 NOTE — Evaluation (Signed)
Occupational Therapy Evaluation Patient Details Name: Emily Boone MRN: [id number] DOB: 1975-11-13 Today's Date: 03/24/2021   History of Present Illness Emily Boone is a 45 y.o. female with medical history significant of hyperlipidemia, started on statin within the past few weeks, presents to the emergency room today with left arm numbness.  Patient states she woke up this morning without any symptoms.  Between 530 and 6 AM, when she was getting ready for work, she began feeling numbness in her left arm which extended up into her left neck and mildly to her face.  She describes associated throbbing in her left arm which is what caught her attention.  Although she does say she feels weak in her left arm, she is able to hold things in her hand.  She can lift her left arm up, but only to about 90 degrees.  She also noticed significant swelling in her left arm which appears to have since resolved.  Reports similar symptoms in the past that occur approximately once a month, lasts about 15 minutes and then resolve spontaneously.  Denies any complaints in other extremities, no changes in vision, no speech impairment, no difficulty swallowing.   Clinical Impression   Pt agreeable to OT evaluation. Pt up and ambulating to bathroom independently at start of session. Pt demonstrates minimal to moderate L UE weakness in comparison to R side. Pt also reports that sensation to L side has improved but still is less sensitive to light touch. Recommended outpatient OT if condition persists. Pt is not recommended for further acute OT services and will be discharged to care of nursing staff for remaining length of stay.       Recommendations for follow up therapy are one component of a multi-disciplinary discharge planning process, led by the attending physician.  Recommendations may be updated based on patient status, additional functional criteria and insurance authorization.   Follow Up Recommendations   Outpatient OT    Assistance Recommended at Discharge None  Functional Status Assessment  Patient has had a recent decline in their functional status and demonstrates the ability to make significant improvements in function in a reasonable and predictable amount of time.  Equipment Recommendations  None recommended by OT    Recommendations for Other Services       Precautions / Restrictions Precautions Precautions: None Restrictions Weight Bearing Restrictions: No      Mobility Bed Mobility Overal bed mobility: Independent                  Transfers Overall transfer level: Independent                        Balance Overall balance assessment: No apparent balance deficits (not formally assessed)                                         ADL either performed or assessed with clinical judgement   ADL Overall ADL's : Independent                                             Vision Baseline Vision/History: 1 Wears glasses (reading) Ability to See in Adequate Light: 1 Impaired Patient Visual Report: No change from baseline Vision Assessment?: No apparent visual deficits Additional  Comments: Per observation                Pertinent Vitals/Pain Pain Assessment: Faces Pain Score: 0-No pain Pain Location: left side Pain Descriptors / Indicators: Numbness Pain Intervention(s): Limited activity within patient's tolerance;Monitored during session     Hand Dominance Right   Extremity/Trunk Assessment Upper Extremity Assessment Upper Extremity Assessment: LUE deficits/detail LUE Deficits / Details: 4/5 MMT grossly. Pt demonstrates weakness that is not reported to be a bseline issues. Pt reports pain and numbness intermittently at baseline but not prolonged weakness like today. LUE Sensation: decreased light touch LUE Coordination: WNL   Lower Extremity Assessment Lower Extremity Assessment: Defer to PT evaluation    Cervical / Trunk Assessment Cervical / Trunk Assessment: Normal   Communication Communication Communication: No difficulties   Cognition Arousal/Alertness: Awake/alert Behavior During Therapy: WFL for tasks assessed/performed Overall Cognitive Status: Within Functional Limits for tasks assessed                                                        Home Living Family/patient expects to be discharged to:: Private residence Living Arrangements: Spouse/significant other Available Help at Discharge: Family;Available 24 hours/day Type of Home: House Home Access: Stairs to enter [employer] of Steps: 3 Entrance Stairs-Rails: Right;Left;Can reach both Home Layout: Two level Alternate Level Stairs-Number of Steps: 12 Alternate Level Stairs-Rails: Left Bathroom Shower/Tub: Occupational psychologist: Standard Bathroom Accessibility: Yes   Home Equipment: None   Additional Comments: Taken via PT note      Prior Functioning/Environment Prior Level of Function : Independent/Modified Independent             Mobility Comments: Hydrographic surveyor, drives, works ADLs Comments: Independent        OT Problem List: Decreased strength       AM-PAC OT "6 Clicks" Daily Activity     Outcome Measure Help from another person eating meals?: None Help from another person taking care of personal grooming?: None Help from another person toileting, which includes using toliet, bedpan, or urinal?: None Help from another person bathing (including washing, rinsing, drying)?: None Help from another person to put on and taking off regular upper body clothing?: None Help from another person to put on and taking off regular lower body clothing?: None 6 Click Score: 24   End of Session    Activity Tolerance: Patient tolerated treatment well Patient left: in bed;with call bell/phone within reach  OT Visit Diagnosis: Muscle weakness (generalized)  (M62.81);Other symptoms and signs involving the nervous system (L89.373)                Time: 4287-6811 OT Time Calculation (min): 8 min Charges:  OT General Charges $OT Visit: 1 Visit OT Evaluation $OT Eval Low Complexity: 1 Low  Andreea Arca OT, MOT  Larey Seat 03/24/2021, 2:49 PM

## 2021-03-24 NOTE — Progress Notes (Signed)
°  Transition of Care [facility]) Screening Note   Patient Details  Name: Emily Boone Date of Birth: 02/03/1976   Transition of Care [facility]) CM/SW Contact:    Ihor Gully, LCSW Phone Number: 03/24/2021, 2:10 PM    Transition of Care Department [facility]) has reviewed patient and no TOC needs have been identified at this time. We will continue to monitor patient advancement through interdisciplinary progression rounds. If new patient transition needs arise, please place a TOC consult.   Emily Boone, Clydene Pugh, LCSW

## 2021-03-24 NOTE — Evaluation (Signed)
Physical Therapy Evaluation Patient Details Name: Emily Boone MRN: [id number] DOB: Oct 02, 1975 Today's Date: 03/24/2021  History of Present Illness  Emily Boone is a 45 y.o. female with medical history significant of hyperlipidemia, started on statin within the past few weeks, presents to the emergency room today with left arm numbness.  Patient states she woke up this morning without any symptoms.  Between 530 and 6 AM, when she was getting ready for work, she began feeling numbness in her left arm which extended up into her left neck and mildly to her face.  She describes associated throbbing in her left arm which is what caught her attention.  Although she does say she feels weak in her left arm, she is able to hold things in her hand.  She can lift her left arm up, but only to about 90 degrees.  She also noticed significant swelling in her left arm which appears to have since resolved.  Reports similar symptoms in the past that occur approximately once a month, lasts about 15 minutes and then resolve spontaneously.  Denies any complaints in other extremities, no changes in vision, no speech impairment, no difficulty swallowing.   Clinical Impression  Patient functioning near baseline for functional mobility and gait other than c/o mild numbness on left side.  Patient demonstrates good return for ambulation in room, stairs and hallways without loss of balance.  Plan:  Patient discharged from physical therapy to care of nursing for ambulation daily as tolerated for length of stay.         Recommendations for follow up therapy are one component of a multi-disciplinary discharge planning process, led by the attending physician.  Recommendations may be updated based on patient status, additional functional criteria and insurance authorization.  Follow Up Recommendations No PT follow up    Assistance Recommended at Discharge PRN  Functional Status Assessment Patient has not had a recent decline  in their functional status  Equipment Recommendations  None recommended by PT    Recommendations for Other Services       Precautions / Restrictions Precautions Precautions: None Restrictions Weight Bearing Restrictions: No      Mobility  Bed Mobility Overal bed mobility: Independent                  Transfers Overall transfer level: Independent                      Ambulation/Gait Ambulation/Gait assistance: Modified independent (Device/Increase time) Gait Distance (Feet): 200 Feet Assistive device: None Gait Pattern/deviations: WFL(Within Functional Limits) Gait velocity: slightly decreased     General Gait Details: grossly WFL with good return for ambulation on level, inclined and declined surfaces without loss of balance  Stairs Stairs: Yes Stairs assistance: Modified independent (Device/Increase time) Stair Management: One rail Right;One rail Left;Step to pattern Number of Stairs: 4 General stair comments: demonstrates good return for going up/down stairs using 1 siderail without loss of balance  Wheelchair Mobility    Modified Rankin (Stroke Patients Only)       Balance Overall balance assessment: No apparent balance deficits (not formally assessed)                                           Pertinent Vitals/Pain Pain Assessment: 0-10 Pain Score: 2  Pain Location: left side Pain Descriptors / Indicators: Numbness Pain Intervention(s): Limited  activity within patient's tolerance;Monitored during session    [place] expects to be discharged to:: Private residence Living Arrangements: Spouse/significant other Available Help at Discharge: Family;Available 24 hours/day Type of Home: House Home Access: Stairs to enter Entrance Stairs-Rails: Right;Left;Can reach both Entrance Stairs-Number of Steps: 3 Alternate Level Stairs-Number of Steps: 12 Home Layout: Two level Home Equipment: None       Prior Function Prior Level of Function : Independent/Modified Independent             Mobility Comments: Hydrographic surveyor, drives, works ADLs Comments: Independent     Hand Dominance   Dominant Hand: Right    Extremity/Trunk Assessment   Upper Extremity Assessment Upper Extremity Assessment: Defer to OT evaluation    Lower Extremity Assessment Lower Extremity Assessment: Overall WFL for tasks assessed    Cervical / Trunk Assessment Cervical / Trunk Assessment: Normal  Communication   Communication: No difficulties  Cognition Arousal/Alertness: Awake/alert Behavior During Therapy: WFL for tasks assessed/performed Overall Cognitive Status: Within Functional Limits for tasks assessed                                          General Comments      Exercises     Assessment/Plan    PT Assessment Patient does not need any further PT services  PT Problem List         PT Treatment Interventions      PT Goals (Current goals can be found in the Care Plan section)  Acute Rehab PT Goals Patient Stated Goal: return home with family to assist PT Goal Formulation: With patient Time For Goal Achievement: 03/24/21 Potential to Achieve Goals: Good    Frequency     Barriers to discharge        Co-evaluation               AM-PAC PT "6 Clicks" Mobility  Outcome Measure Help needed turning from your back to your side while in a flat bed without using bedrails?: None Help needed moving from lying on your back to sitting on the side of a flat bed without using bedrails?: None Help needed moving to and from a bed to a chair (including a wheelchair)?: None Help needed standing up from a chair using your arms (e.g., wheelchair or bedside chair)?: None Help needed to walk in hospital room?: None Help needed climbing 3-5 steps with a railing? : None 6 Click Score: 24    End of Session   Activity Tolerance: Patient tolerated treatment  well Patient left: in bed;with call bell/phone within reach Nurse Communication: Mobility status PT Visit Diagnosis: Unsteadiness on feet (R26.81);Other abnormalities of gait and mobility (R26.89);Muscle weakness (generalized) (M62.81)    Time: 1012-1026 PT Time Calculation (min) (ACUTE ONLY): 14 min   Charges:   PT Evaluation $PT Eval Low Complexity: 1 Low PT Treatments $Gait Training: 8-22 mins        2:19 PM, 03/24/21 Lonell Grandchild, MPT Physical Therapist with [facility] [phone] office [phone] mobile phone

## 2021-03-24 NOTE — Procedures (Signed)
Patient Name: Emily Boone  MRN: [id number]  Epilepsy Attending: Lora Havens  Referring Physician/Provider: Kathie Dike, MD Date: 03/24/2021 Duration: 22.50 mins  Patient history: 45yo f with left arm numbness. EEG to evaluate for seizure  Level of alertness: Awake, asleep  AEDs during EEG study: None  Technical aspects: This EEG study was done with scalp electrodes positioned according to the 10-20 International system of electrode placement. Electrical activity was acquired at a sampling rate of 500Hz  and reviewed with a high frequency filter of 70Hz  and a low frequency filter of 1Hz . EEG data were recorded continuously and digitally stored.   Description: The posterior dominant rhythm consists of 10 Hz activity of moderate voltage (25-35 uV) seen predominantly in posterior head regions, symmetric and reactive to eye opening and eye closing. Sleep was characterized by vertex waves, sleep spindles (12 to 14 Hz), maximal frontocentral region. Hyperventilation did not show any EEG change.  Physiologic photic driving was seen during photic stimulation.   IMPRESSION: This study is within normal limits. No seizures or epileptiform discharges were seen throughout the recording.  [name] [name]

## 2021-04-24 ENCOUNTER — Other Ambulatory Visit: Payer: Self-pay

## 2021-04-24 ENCOUNTER — Ambulatory Visit (INDEPENDENT_AMBULATORY_CARE_PROVIDER_SITE_OTHER): Payer: Self-pay | Admitting: *Deleted

## 2021-04-24 VITALS — Ht 70.0 in | Wt 230.0 lb

## 2021-04-24 DIAGNOSIS — Z1211 Encounter for screening for malignant neoplasm of colon: Secondary | ICD-10-CM

## 2021-04-24 NOTE — Progress Notes (Signed)
Faxed records release to obtain records.

## 2021-04-24 NOTE — Progress Notes (Signed)
Ok to schedule. ASA 2.  

## 2021-04-24 NOTE — Progress Notes (Signed)
Received fax stating no record found.  Scanned into Epic.

## 2021-04-24 NOTE — Progress Notes (Addendum)
Gastroenterology Pre-Procedure Review  Request Date: 04/24/2021 Requesting Physician: Darla Lesches, [place] [facility] [place], Last TCS done 10 years ago by Dr. Janus Molder in [place], pt thinks she had polyps  PATIENT REVIEW QUESTIONS: The patient responded to the following health history questions as indicated:    1. Diabetes Melitis: no 2. Joint replacements in the past 12 months: no 3. Major health problems in the past 3 months: no 4. Has an artificial valve or MVP: no 5. Has a defibrillator: no 6. Has been advised in past to take antibiotics in advance of a procedure like teeth cleaning: no 7. Family history of colon cancer: no  8. Alcohol Use: no 9. Illicit drug Use: no 10. History of sleep apnea: no  11. History of coronary artery or other vascular stents placed within the last 12 months: no 12. History of any prior anesthesia complications: yes, woke up during last procedure-colonoscopy, morphine drops her blood pressure 13. Body mass index is 33 kg/m.    MEDICATIONS & ALLERGIES:    Patient reports the following regarding taking any blood thinners:   Plavix? no Aspirin? no Coumadin? no Brilinta? no Xarelto? no Eliquis? no Pradaxa? no Savaysa? no Effient? no  Patient confirms/reports the following medications:  Current Outpatient Medications  Medication Sig Dispense Refill   atorvastatin (LIPITOR) 40 MG tablet Take 1 tablet (40 mg total) by mouth daily. 90 tablet 3   fluticasone (FLONASE) 50 MCG/ACT nasal spray Place 2 sprays into both nostrils daily. (Patient taking differently: Place 2 sprays into both nostrils as needed.) 16 g 6   loratadine (CLARITIN) 10 MG tablet Take 10 mg by mouth as needed.     omeprazole (PRILOSEC) 20 MG capsule Take 20 mg by mouth 2 (two) times daily before a meal.     No current facility-administered medications for this visit.    Patient confirms/reports the following allergies:  Allergies  Allergen Reactions    Clindamycin/Lincomycin Other (See Comments)    "stuck in throat" cannot take pill form; can take as liquid   Penicillins Swelling and Rash    Childhood reaction and family history  Has patient had a PCN reaction causing immediate rash, facial/tongue/throat swelling, SOB or lightheadedness with hypotension: Unknown Has patient had a PCN reaction causing severe rash involving mucus membranes or skin necrosis: Unknown Has patient had a PCN reaction that required hospitalization: Unknown Has patient had a PCN reaction occurring within the last 10 years: No If all of the above answers are "NO", then may proceed with Cephalosporin use.     No orders of the defined types were placed in this encounter.   AUTHORIZATION INFORMATION Primary Insurance: [place],  [place] #: [id number],  Group #: [id number] Pre-Cert / Auth required: No, not required per Noralee Chars / Auth #: Ref#: [id number]  SCHEDULE INFORMATION: Procedure has been scheduled as follows:  Date: 05/12/2021, Time: 10:00 Location: [facility] with Dr. Abbey Chatters  This Gastroenterology Pre-Precedure Review Form is being routed to the following provider(s): Neil Crouch, PA-C

## 2021-04-25 ENCOUNTER — Other Ambulatory Visit: Payer: Self-pay | Admitting: *Deleted

## 2021-04-25 ENCOUNTER — Encounter: Payer: Self-pay | Admitting: *Deleted

## 2021-04-25 DIAGNOSIS — Z1211 Encounter for screening for malignant neoplasm of colon: Secondary | ICD-10-CM

## 2021-04-25 NOTE — Progress Notes (Signed)
Spoke to pt.  Scheduled procedure for 05/12/2021 with arrival at 8:30.  Reviewed prep instructions with pt.  Pt made aware to come by office to pick up Clenpiq kit.  Pt aware that she must have pregnancy test at [facility] on 05/10/2021.  Pt said she will come pick up kit and instructions that day.  I will mail out prep instructions as well.  Confirmed mailing address.

## 2021-04-28 ENCOUNTER — Other Ambulatory Visit: Payer: Self-pay | Admitting: *Deleted

## 2021-04-28 NOTE — Progress Notes (Signed)
Spoke to [place] at [employer].  No pre-cert required or age criteria for colonoscopies.  REF#: [id number]

## 2021-05-08 ENCOUNTER — Other Ambulatory Visit (HOSPITAL_COMMUNITY)
Admission: RE | Admit: 2021-05-08 | Discharge: 2021-05-08 | Disposition: A | Discharge status: Home / Self Care | Payer: BC Managed Care – PPO | Source: Ambulatory Visit | Attending: Internal Medicine | Admitting: Internal Medicine

## 2021-05-08 DIAGNOSIS — Z1211 Encounter for screening for malignant neoplasm of colon: Secondary | ICD-10-CM | POA: Diagnosis not present

## 2021-05-08 LAB — PREGNANCY, URINE: Preg Test, Ur: NEGATIVE

## 2021-05-12 ENCOUNTER — Encounter (HOSPITAL_COMMUNITY)
Admission: RE | Disposition: A | Discharge status: Home / Self Care | Payer: Self-pay | Source: Home / Self Care | Attending: Internal Medicine

## 2021-05-12 ENCOUNTER — Ambulatory Visit (HOSPITAL_COMMUNITY)
Admission: RE | Admit: 2021-05-12 | Discharge: 2021-05-12 | Disposition: A | Discharge status: Home / Self Care | Payer: BC Managed Care – PPO | Attending: Internal Medicine | Admitting: Internal Medicine

## 2021-05-12 ENCOUNTER — Ambulatory Visit (HOSPITAL_COMMUNITY): Payer: BC Managed Care – PPO | Admitting: Anesthesiology

## 2021-05-12 ENCOUNTER — Encounter (HOSPITAL_COMMUNITY): Payer: Self-pay

## 2021-05-12 ENCOUNTER — Other Ambulatory Visit: Payer: Self-pay

## 2021-05-12 DIAGNOSIS — Z1211 Encounter for screening for malignant neoplasm of colon: Secondary | ICD-10-CM | POA: Diagnosis not present

## 2021-05-12 DIAGNOSIS — K648 Other hemorrhoids: Secondary | ICD-10-CM | POA: Insufficient documentation

## 2021-05-12 DIAGNOSIS — K219 Gastro-esophageal reflux disease without esophagitis: Secondary | ICD-10-CM | POA: Insufficient documentation

## 2021-05-12 DIAGNOSIS — K635 Polyp of colon: Secondary | ICD-10-CM | POA: Diagnosis not present

## 2021-05-12 DIAGNOSIS — Z87891 Personal history of nicotine dependence: Secondary | ICD-10-CM | POA: Diagnosis not present

## 2021-05-12 DIAGNOSIS — K5730 Diverticulosis of large intestine without perforation or abscess without bleeding: Secondary | ICD-10-CM | POA: Diagnosis not present

## 2021-05-12 DIAGNOSIS — Z139 Encounter for screening, unspecified: Secondary | ICD-10-CM | POA: Diagnosis not present

## 2021-05-12 HISTORY — PX: COLONOSCOPY WITH PROPOFOL: SHX5780

## 2021-05-12 HISTORY — PX: POLYPECTOMY: SHX149

## 2021-05-12 SURGERY — COLONOSCOPY WITH PROPOFOL
Anesthesia: General

## 2021-05-12 MED ORDER — LACTATED RINGERS IV SOLN
INTRAVENOUS | Status: DC
  Administered 2021-05-12: 1000 mL via INTRAVENOUS

## 2021-05-12 MED ORDER — PROPOFOL 10 MG/ML IV BOLUS
INTRAVENOUS | Status: DC | PRN
  Administered 2021-05-12: 100 mg via INTRAVENOUS
  Administered 2021-05-12: 30 mg via INTRAVENOUS

## 2021-05-12 MED ORDER — LIDOCAINE 2% (20 MG/ML) 5 ML SYRINGE
INTRAMUSCULAR | Status: DC | PRN
  Administered 2021-05-12: 50 mg via INTRAVENOUS

## 2021-05-12 MED ORDER — PROPOFOL 500 MG/50ML IV EMUL
INTRAVENOUS | Status: DC | PRN
  Administered 2021-05-12: 300 ug/kg/min via INTRAVENOUS

## 2021-05-12 NOTE — Anesthesia Postprocedure Evaluation (Signed)
Anesthesia Post Note  Patient: Musician  Procedure(s) Performed: COLONOSCOPY WITH PROPOFOL POLYPECTOMY INTESTINAL  Patient location during evaluation: Phase II Anesthesia Type: General Level of consciousness: awake Pain management: pain level controlled Vital Signs Assessment: post-procedure vital signs reviewed and stable Respiratory status: spontaneous breathing and respiratory function stable Cardiovascular status: blood pressure returned to baseline and stable Postop Assessment: no headache and no apparent nausea or vomiting Anesthetic complications: no Comments: Late entry   No notable events documented.   Last Vitals:  Vitals:   05/12/21 1033 05/12/21 1035  BP: (!) 92/47   Pulse: 67 75  Resp: 19 20  Temp: (!) 36.4 C   SpO2: 94% 97%    Last Pain:  Vitals:   05/12/21 1033  TempSrc: Oral  PainSc:                  Louann Sjogren

## 2021-05-12 NOTE — Op Note (Signed)
[facility] Patient Name: Emily Boone Spires Procedure Date: 05/12/2021 10:07 AM MRN: [id number] Date of Birth: [birth year] Attending MD: Elon Alas. Edgar Frisk CSN: [id number] Age: 46 Admit Type: Outpatient Procedure:                Colonoscopy Indications:              Screening for colorectal malignant neoplasm Providers:                Elon Alas. Abbey Chatters, DO, Crystal Page, Wynonia Musty Tech, Technician Referring MD:              Medicines:                See the Anesthesia note for documentation of the                            administered medications Complications:            No immediate complications. Estimated Blood Loss:     Estimated blood loss was minimal. Procedure:                Pre-Anesthesia Assessment:                           - The anesthesia plan was to use monitored                            anesthesia care (MAC).                           After obtaining informed consent, the colonoscope                            was passed under direct vision. Throughout the                            procedure, the patient's blood pressure, pulse, and                            oxygen saturations were monitored continuously. The                            [id number]-HQ190L ([id number]) scope was introduced through                            the anus and advanced to the the cecum, identified                            by appendiceal orifice and ileocecal valve. The                            colonoscopy was performed without difficulty. The                            patient tolerated the procedure well. The  quality                            of the bowel preparation was evaluated using the                            BBPS [facility] Bowel Preparation Scale) with scores                            of: Right Colon = 3, Transverse Colon = 3 and Left                            Colon = 3 (entire mucosa seen well with no residual                             staining, small fragments of stool or opaque                            liquid). The total BBPS score equals 9. Scope In: 10:14:41 AM Scope Out: 10:29:57 AM Scope Withdrawal Time: 0 hours 9 minutes 53 seconds  Total Procedure Duration: 0 hours 15 minutes 16 seconds  Findings:      The perianal and digital rectal examinations were normal.      Non-bleeding internal hemorrhoids were found during endoscopy.      Multiple small and large-mouthed diverticula were found in the sigmoid       colon and descending colon.      Three sessile polyps were found in the sigmoid colon. The polyps were       diminutive in size. These polyps were removed with a cold snare.       Resection and retrieval were complete.      The exam was otherwise without abnormality. Impression:               - Non-bleeding internal hemorrhoids.                           - Diverticulosis in the sigmoid colon and in the                            descending colon.                           - Three diminutive polyps in the sigmoid colon,                            removed with a cold snare. Resected and retrieved.                           - The examination was otherwise normal. Moderate Sedation:      Per Anesthesia Care Recommendation:           - Patient has a contact number available for                            emergencies. The signs and symptoms of potential  delayed complications were discussed with the                            patient. Return to normal activities tomorrow.                            Written discharge instructions were provided to the                            patient.                           - Resume previous diet.                           - Continue present medications.                           - Await pathology results.                           - Repeat colonoscopy in 5-10 years for surveillance.                           - Return to GI clinic PRN. Procedure  Code(s):        --- Professional ---                           [phone], Colonoscopy, flexible; with removal of                            tumor(s), polyp(s), or other lesion(s) by snare                            technique Diagnosis Code(s):        --- Professional ---                           Z12.11, Encounter for screening for malignant                            neoplasm of colon                           K64.8, Other hemorrhoids                           K63.5, Polyp of colon                           K57.30, Diverticulosis of large intestine without                            perforation or abscess without bleeding CPT copyright 2019 American Medical Association. All rights reserved. The codes documented in this report are preliminary and upon coder review may  be revised to meet current compliance requirements. Elon Alas. Abbey Chatters, DO [place] Abbey Chatters, DO 05/12/2021  10:33:20 AM This report has been signed electronically. Number of Addenda: 0

## 2021-05-12 NOTE — H&P (Signed)
Primary Care Physician:  Baruch Gouty, FNP Primary Gastroenterologist:  Dr. Abbey Chatters  Pre-Procedure History & Physical: HPI:  Emily Boone is a 46 y.o. female is here for a colonoscopy for colon cancer screening purposes.  Patient denies any family history of colorectal cancer.  No melena or hematochezia.  No abdominal pain or unintentional weight loss.  No change in bowel habits.  Overall feels well from a GI standpoint.  Past Medical History:  Diagnosis Date   Anxiety    Depression    GERD (gastroesophageal reflux disease)    History of kidney stones    PTSD (post-traumatic stress disorder)     Past Surgical History:  Procedure Laterality Date   CHOLECYSTECTOMY     EPIGASTRIC HERNIA REPAIR N/A 12/14/2016   Procedure: OPEN REPAIR OF EPIGASTRIC HERNIA ;  Surgeon: Clovis Riley, MD;  Location: [facility];  Service: General;  Laterality: N/A;   HERNIA REPAIR     INSERTION OF MESH N/A 12/14/2016   Procedure: INSERTION OF MESH;  Surgeon: Clovis Riley, MD;  Location: [facility];  Service: General;  Laterality: N/A;   KNEE ARTHROSCOPY Right    TUBAL LIGATION     UMBILICAL HERNIA REPAIR N/A 12/14/2016   Procedure: OPEN REPAIR OF UMBILICAL HERNIA;  Surgeon: Clovis Riley, MD;  Location: [facility];  Service: General;  Laterality: N/A;    Prior to Admission medications   Medication Sig Start Date End Date Taking? Authorizing Provider  atorvastatin (LIPITOR) 40 MG tablet Take 1 tablet (40 mg total) by mouth daily. Patient taking differently: Take 40 mg by mouth at bedtime. 03/10/21  Yes Rakes, Connye Burkitt, FNP  fluticasone (FLONASE) 50 MCG/ACT nasal spray Place 2 sprays into both nostrils daily. Patient taking differently: Place 2 sprays into both nostrils daily as needed for allergies. 03/09/21  Yes Rakes, Connye Burkitt, FNP  ibuprofen (ADVIL) 200 MG tablet Take 400-800 mg by mouth every 8 (eight) hours as needed (for fibromyalgia pain).   Yes [provider]  loratadine (CLARITIN) 10 MG  tablet Take 10 mg by mouth daily as needed for allergies.   Yes [provider]  omeprazole (PRILOSEC) 20 MG capsule Take 20 mg by mouth 2 (two) times daily as needed (indigestion/heartburn).   Yes [provider]    Allergies as of 04/25/2021 - Review Complete 04/24/2021  Allergen Reaction Noted   Clindamycin/lincomycin Other (See Comments) 09/17/2017   Penicillins Swelling and Rash 06/27/2015    Family History  Problem Relation Age of Onset   Hypertension Maternal Grandmother    Depression Maternal Grandmother    Cancer Maternal Grandfather    Diverticulitis Paternal Grandmother    Breast cancer Neg Hx     Social History   Socioeconomic History   Marital status: Married    Spouse name: Not on file   Number of children: Not on file   Years of education: Not on file   Highest education level: Not on file  Occupational History   Not on file  Tobacco Use   Smoking status: Former    Packs/day: 0.10    Years: 17.00    Pack years: 1.70    Types: Cigarettes    Quit date: 09/01/2015    Years since quitting: 5.6   Smokeless tobacco: Never  Vaping Use   Vaping Use: Former  Substance and Sexual Activity   Alcohol use: No   Drug use: No   Sexual activity: Not on file  Other Topics Concern  Not on file  Social History Narrative   Not on file   Social Determinants of Health   Financial Resource Strain: Not on file  Food Insecurity: Not on file  Transportation Needs: Not on file  Physical Activity: Not on file  Stress: Not on file  Social Connections: Not on file  Intimate Partner Violence: Not on file    Review of Systems: See HPI, otherwise negative ROS  Physical Exam: Vital signs in last 24 hours: Temp:  [98.3 F (36.8 C)] 98.3 F (36.8 C) (02/10 0855) Pulse Rate:  [73] 73 (02/10 0855) Resp:  [14] 14 (02/10 0855) BP: (120)/(88) 120/88 (02/10 0855) SpO2:  [97 %] 97 % (02/10 0855) Weight:  [104.3 kg] 104.3 kg (02/10 0855)   General:    Alert,  Well-developed, well-nourished, pleasant and cooperative in NAD Head:  Normocephalic and atraumatic. Eyes:  Sclera clear, no icterus.   Conjunctiva pink. Ears:  Normal auditory acuity. Nose:  No deformity, discharge,  or lesions. Mouth:  No deformity or lesions, dentition normal. Neck:  Supple; no masses or thyromegaly. Lungs:  Clear throughout to auscultation.   No wheezes, crackles, or rhonchi. No acute distress. Heart:  Regular rate and rhythm; no murmurs, clicks, rubs,  or gallops. Abdomen:  Soft, nontender and nondistended. No masses, hepatosplenomegaly or hernias noted. Normal bowel sounds, without guarding, and without rebound.   Msk:  Symmetrical without gross deformities. Normal posture. Extremities:  Without clubbing or edema. Neurologic:  Alert and  oriented x4;  grossly normal neurologically. Skin:  Intact without significant lesions or rashes. Cervical Nodes:  No significant cervical adenopathy. Psych:  Alert and cooperative. Normal mood and affect.  Impression/Plan: Emily Boone is here for a colonoscopy to be performed for colon cancer screening purposes.  The risks of the procedure including infection, bleed, or perforation as well as benefits, limitations, alternatives and imponderables have been reviewed with the patient. Questions have been answered. All parties agreeable.

## 2021-05-12 NOTE — Transfer of Care (Signed)
Immediate Anesthesia Transfer of Care Note  Patient: Emily Boone  Procedure(s) Performed: COLONOSCOPY WITH PROPOFOL POLYPECTOMY INTESTINAL  Patient Location: Endoscopy Unit  Anesthesia Type:MAC  Level of Consciousness: sedated and responds to stimulation  Airway & Oxygen Therapy: Patient Spontanous Breathing  Post-op Assessment: Report given to RN and Post -op Vital signs reviewed and stable  Post vital signs: Reviewed and stable  Last Vitals:  Vitals Value Taken Time  BP    Temp    Pulse    Resp    SpO2      Last Pain:  Vitals:   05/12/21 1012  TempSrc:   PainSc: 0-No pain         Complications: No notable events documented.

## 2021-05-12 NOTE — Discharge Instructions (Addendum)
°  Colonoscopy Discharge Instructions  Read the instructions outlined below and refer to this sheet in the next few weeks. These discharge instructions provide you with general information on caring for yourself after you leave the hospital. Your doctor may also give you specific instructions. While your treatment has been planned according to the most current medical practices available, unavoidable complications occasionally occur.   ACTIVITY You may resume your regular activity, but move at a slower pace for the next 24 hours.  Take frequent rest periods for the next 24 hours.  Walking will help get rid of the air and reduce the bloated feeling in your belly (abdomen).  No driving for 24 hours (because of the medicine (anesthesia) used during the test).   Do not sign any important legal documents or operate any machinery for 24 hours (because of the anesthesia used during the test).  NUTRITION Drink plenty of fluids.  You may resume your normal diet as instructed by your doctor.  Begin with a light meal and progress to your normal diet. Heavy or fried foods are harder to digest and may make you feel sick to your stomach (nauseated).  Avoid alcoholic beverages for 24 hours or as instructed.  MEDICATIONS You may resume your normal medications unless your doctor tells you otherwise.  WHAT YOU CAN EXPECT TODAY Some feelings of bloating in the abdomen.  Passage of more gas than usual.  Spotting of blood in your stool or on the toilet paper.  IF YOU HAD POLYPS REMOVED DURING THE COLONOSCOPY: No aspirin products for 7 days or as instructed.  No alcohol for 7 days or as instructed.  Eat a soft diet for the next 24 hours.  FINDING OUT THE RESULTS OF YOUR TEST Not all test results are available during your visit. If your test results are not back during the visit, make an appointment with your caregiver to find out the results. Do not assume everything is normal if you have not heard from your  caregiver or the medical facility. It is important for you to follow up on all of your test results.  SEEK IMMEDIATE MEDICAL ATTENTION IF: You have more than a spotting of blood in your stool.  Your belly is swollen (abdominal distention).  You are nauseated or vomiting.  You have a temperature over 101.  You have abdominal pain or discomfort that is severe or gets worse throughout the day.   Your colonoscopy revealed 3 polyp(s) which I removed successfully. Await pathology results, my office will contact you. I recommend repeating colonoscopy in 5-10 years for surveillance purposes.  You also have diverticulosis and internal hemorrhoids. I would recommend increasing fiber in your diet or adding OTC Benefiber/Metamucil. Be sure to drink at least 4 to 6 glasses of water daily. Follow-up with GI as needed.   I hope you have a great rest of your week!  Elon Alas. Abbey Chatters, D.O. Gastroenterology and Hepatology [facility]

## 2021-05-12 NOTE — Anesthesia Preprocedure Evaluation (Addendum)
Anesthesia Evaluation  Patient identified by MRN, date of birth, ID band Patient awake    Reviewed: Allergy & Precautions, H&P , NPO status , Patient's Chart, lab work & pertinent test results, reviewed documented beta blocker date and time   Airway Mallampati: II  TM Distance: >3 FB Neck ROM: full    Dental no notable dental hx.    Pulmonary neg pulmonary ROS, former smoker,    Pulmonary exam normal breath sounds clear to auscultation       Cardiovascular Exercise Tolerance: Good negative cardio ROS   Rhythm:regular Rate:Normal     Neuro/Psych PSYCHIATRIC DISORDERS Anxiety Depression  Neuromuscular disease    GI/Hepatic Neg liver ROS, GERD  Medicated,  Endo/Other  negative endocrine ROS  Renal/GU negative Renal ROS  negative genitourinary   Musculoskeletal   Abdominal   Peds  Hematology negative hematology ROS (+)   Anesthesia Other Findings 1. Left ventricular ejection fraction, by estimation, is 60 to 65%. The  left ventricle has normal function. The left ventricle has no regional  wall motion abnormalities. The left ventricular internal cavity size was  mildly dilated. Left ventricular  diastolic parameters were normal.  2. Right ventricular systolic function is normal. The right ventricular  size is normal.  3. The mitral valve is normal in structure. Trivial mitral valve  regurgitation.  4. The aortic valve is normal in structure. Aortic valve regurgitation is  not visualized.  5. Agitated saline contrast bubble study was negative, with no evidence  of any interatrial shunt.   Reproductive/Obstetrics negative OB ROS                            Anesthesia Physical Anesthesia Plan  ASA: 2  Anesthesia Plan: General   Post-op Pain Management:    Induction:   PONV Risk Score and Plan: Propofol infusion  Airway Management Planned:   Additional Equipment:   Intra-op  Plan:   Post-operative Plan:   Informed Consent: I have reviewed the patients History and Physical, chart, labs and discussed the procedure including the risks, benefits and alternatives for the proposed anesthesia with the patient or authorized representative who has indicated his/her understanding and acceptance.     Dental Advisory Given  Plan Discussed with: CRNA  Anesthesia Plan Comments:         Anesthesia Quick Evaluation

## 2021-05-15 LAB — SURGICAL PATHOLOGY

## 2021-05-16 ENCOUNTER — Encounter (HOSPITAL_COMMUNITY): Payer: Self-pay | Admitting: Internal Medicine

## 2021-05-18 ENCOUNTER — Ambulatory Visit (INDEPENDENT_AMBULATORY_CARE_PROVIDER_SITE_OTHER): Payer: BC Managed Care – PPO | Admitting: Family Medicine

## 2021-05-18 ENCOUNTER — Encounter: Payer: Self-pay | Admitting: Family Medicine

## 2021-05-18 VITALS — BP 110/73 | HR 76 | Temp 96.9°F | Ht 70.0 in | Wt 233.0 lb

## 2021-05-18 DIAGNOSIS — N921 Excessive and frequent menstruation with irregular cycle: Secondary | ICD-10-CM | POA: Diagnosis not present

## 2021-05-18 LAB — CBC WITH DIFFERENTIAL/PLATELET
Basophils Absolute: 0.1 10*3/uL (ref 0.0–0.2)
Basos: 1 %
EOS (ABSOLUTE): 0.2 10*3/uL (ref 0.0–0.4)
Eos: 3 %
Hematocrit: 35 % (ref 34.0–46.6)
Hemoglobin: 11.2 g/dL (ref 11.1–15.9)
Immature Grans (Abs): 0 10*3/uL (ref 0.0–0.1)
Immature Granulocytes: 0 %
Lymphocytes Absolute: 2.2 10*3/uL (ref 0.7–3.1)
Lymphs: 24 %
MCH: 26 pg — ABNORMAL LOW (ref 26.6–33.0)
MCHC: 32 g/dL (ref 31.5–35.7)
MCV: 81 fL (ref 79–97)
Monocytes Absolute: 0.7 10*3/uL (ref 0.1–0.9)
Monocytes: 7 %
Neutrophils Absolute: 5.9 10*3/uL (ref 1.4–7.0)
Neutrophils: 65 %
Platelets: 328 10*3/uL (ref 150–450)
RBC: 4.3 x10E6/uL (ref 3.77–5.28)
RDW: 14.6 % (ref 11.7–15.4)
WBC: 9.1 10*3/uL (ref 3.4–10.8)

## 2021-05-18 NOTE — Progress Notes (Addendum)
Subjective:  [name], female    DOB: Nov 10, 1975, 46 y.o.   MRN: [id number]  Patient Care Team: Baruch Gouty, FNP as PCP - General (Family Medicine)   Chief Complaint:  Menstrual Problem   HPI: Emily Boone is a 46 y.o. female presenting on 05/18/2021 for Menstrual Problem   Pt presents with complaints of irregular menses. Historically her periods have been 3 days. The past two months menses have been inconsistent with increased clotting and duration. The biggest clot has been a baseball in size. The flow waxes and wanes in volume throughout her menses. She states she has 3 heavy days of flow and occasionally saturates a pad in an hour. She endorses increased fatigue, denies light-headedness, dizziness, or cramping.  She is currently on her period, spotting began 1/31 and normal flow began 2/3.    Relevant past medical, surgical, family, and social history reviewed and updated as indicated.  Allergies and medications reviewed and updated. Data reviewed: Chart in Epic.   Past Medical History:  Diagnosis Date   Anxiety    Depression    GERD (gastroesophageal reflux disease)    History of kidney stones    PTSD (post-traumatic stress disorder)     Past Surgical History:  Procedure Laterality Date   CHOLECYSTECTOMY     COLONOSCOPY WITH PROPOFOL N/A 05/12/2021   Procedure: COLONOSCOPY WITH PROPOFOL;  Surgeon: Eloise Harman, DO;  [facility];  Service: Endoscopy;  Laterality: N/A;  10:00 / ASA 2   EPIGASTRIC HERNIA REPAIR N/A 12/14/2016   Procedure: OPEN REPAIR OF EPIGASTRIC HERNIA ;  Surgeon: Clovis Riley, MD;  Location: [facility];  Service: General;  Laterality: N/A;   HERNIA REPAIR     INSERTION OF MESH N/A 12/14/2016   Procedure: INSERTION OF MESH;  Surgeon: Clovis Riley, MD;  Location: [facility];  Service: General;  Laterality: N/A;   KNEE ARTHROSCOPY Right    POLYPECTOMY  05/12/2021   Procedure: POLYPECTOMY INTESTINAL;  Surgeon:  Eloise Harman, DO;  Location: [facility];  Service: Endoscopy;;   TUBAL LIGATION     UMBILICAL HERNIA REPAIR N/A 12/14/2016   Procedure: OPEN REPAIR OF UMBILICAL HERNIA;  Surgeon: Clovis Riley, MD;  Location: [facility];  Service: General;  Laterality: N/A;    Social History   Socioeconomic History   Marital status: Married    Spouse name: Not on file   Number of children: Not on file   Years of education: Not on file   Highest education level: Not on file  Occupational History   Not on file  Tobacco Use   Smoking status: Former    Packs/day: 0.10    Years: 17.00    Pack years: 1.70    Types: Cigarettes    Quit date: 09/01/2015    Years since quitting: 5.7   Smokeless tobacco: Never  Vaping Use   Vaping Use: Former  Substance and Sexual Activity   Alcohol use: No   Drug use: No   Sexual activity: Not on file  Other Topics Concern   Not on file  Social History Narrative   Not on file   Social Determinants of Health   Financial Resource Strain: Not on file  Food Insecurity: Not on file  Transportation Needs: Not on file  Physical Activity: Not on file  Stress: Not on file  Social Connections: Not on file  Intimate Partner Violence: Not on file    Outpatient  Encounter Medications as of 05/18/2021  Medication Sig   atorvastatin (LIPITOR) 40 MG tablet Take 1 tablet (40 mg total) by mouth daily. (Patient taking differently: Take 40 mg by mouth at bedtime.)   fluticasone (FLONASE) 50 MCG/ACT nasal spray Place 2 sprays into both nostrils daily. (Patient taking differently: Place 2 sprays into both nostrils daily as needed for allergies.)   ibuprofen (ADVIL) 200 MG tablet Take 400-800 mg by mouth every 8 (eight) hours as needed (for fibromyalgia pain).   loratadine (CLARITIN) 10 MG tablet Take 10 mg by mouth daily as needed for allergies.   omeprazole (PRILOSEC) 20 MG capsule Take 20 mg by mouth 2 (two) times daily as needed (indigestion/heartburn).   No  facility-administered encounter medications on file as of 05/18/2021.    Allergies  Allergen Reactions   Clindamycin/Lincomycin Other (See Comments)    "stuck in throat" cannot take pill form; can take as liquid   Morphine And Related Other (See Comments)    Hypotensive (lower blood pressure)   Penicillins Swelling and Rash    Childhood reaction and family history  Has patient had a PCN reaction causing immediate rash, facial/tongue/throat swelling, SOB or lightheadedness with hypotension: Unknown Has patient had a PCN reaction causing severe rash involving mucus membranes or skin necrosis: Unknown Has patient had a PCN reaction that required hospitalization: Unknown Has patient had a PCN reaction occurring within the last 10 years: No If all of the above answers are "NO", then may proceed with Cephalosporin use.     Review of Systems  Constitutional:  Positive for fatigue. Negative for unexpected weight change.  Respiratory:  Negative for shortness of breath.   Cardiovascular:  Negative for chest pain and palpitations.  Genitourinary:  Positive for menstrual problem and vaginal bleeding. Negative for vaginal pain.  Skin:  Negative for pallor.  Neurological:  Negative for dizziness, tremors, seizures, syncope, facial asymmetry, speech difficulty, weakness, light-headedness, numbness and headaches.  All other systems reviewed and are negative.      Objective:  BP 110/73    Pulse 76    Temp (!) 96.9 F (36.1 C) (Temporal)    Ht 5\' 10"  (1.778 m)    Wt 105.7 kg    SpO2 97%    BMI 33.43 kg/m    Wt Readings from Last 3 Encounters:  05/18/21 105.7 kg  05/12/21 104.3 kg  04/24/21 104.3 kg    Physical Exam Vitals and nursing note reviewed.  Constitutional:      General: She is not in acute distress.    Appearance: Normal appearance. She is obese. She is not ill-appearing, toxic-appearing or diaphoretic.  HENT:     Head: Normocephalic and atraumatic.  Eyes:      Conjunctiva/sclera: Conjunctivae normal.     Pupils: Pupils are equal, round, and reactive to light.  Cardiovascular:     Rate and Rhythm: Normal rate and regular rhythm.     Heart sounds: Normal heart sounds.  Pulmonary:     Effort: Pulmonary effort is normal.     Breath sounds: Normal breath sounds.  Abdominal:     General: Bowel sounds are normal.     Palpations: Abdomen is soft.     Tenderness: There is no abdominal tenderness.  Genitourinary:    Comments: Deferred  Musculoskeletal:        General: Normal range of motion.     Cervical back: Normal range of motion.  Skin:    General: Skin is warm and dry.  Capillary Refill: Capillary refill takes less than 2 seconds.     Coloration: Skin is not pale.  Neurological:     General: No focal deficit present.     Mental Status: She is alert and oriented to person, place, and time.  Psychiatric:        Mood and Affect: Mood normal.        Behavior: Behavior normal.        Thought Content: Thought content normal.        Judgment: Judgment normal.    Results for orders placed or performed during the hospital encounter of 05/12/21  Surgical pathology  Result Value Ref Range   SURGICAL PATHOLOGY      SURGICAL PATHOLOGY CASE: [id number] PATIENT: [facility] Report     Clinical History: screening     FINAL MICROSCOPIC DIAGNOSIS:  A. COLON, SIGMOID, POLYPECTOMY: - Hyperplastic polyp (s).   GROSS DESCRIPTION:  Received in formalin are tan, soft tissue fragments that are submitted in toto. Number: 3 size: 0.4-0.5 cm blocks: 1 (GRP 05/12/2021)   Final Diagnosis performed by Claudette Laws, MD.   Electronically signed 05/15/2021 Technical component performed at [facility], [place] [address], [place], [place] [address].  Professional component performed at [facility], [place] [address] [place], [place] [address].  Immunohistochemistry Technical component  (if applicable) was performed at [facility]. [address], [place], [place], [place] [address].   IMMUNOHISTOCHEMISTRY DISCLAIMER (if applicable): Some of these immunohistochemical stains may have been developed and the perfor mance characteristics determine by [facility]. Some may not have been cleared or approved by the U.S. Food and Drug Administration. The FDA has determined that such clearance or approval is not necessary. This test is used for clinical purposes. It should not be regarded as investigational or for research. This laboratory is certified under the [facility] (CLIA-88) as qualified to perform high complexity clinical laboratory testing.  The controls stained appropriately.        Pertinent labs & imaging results that were available during my care of the patient were reviewed by me and considered in my medical decision making.  Assessment & Plan:  Emily Boone was seen today for menstrual problem.  Diagnoses and all orders for this visit:  Menorrhagia with irregular cycle -     CBC with Differential -     US Pelvis Complete; Future - Patient endorses fatigue but denies dizziness or lightheadedness. Will check CBC to rule out anemia secondary to increased blood loss. Order placed for complete pelvic ultrasound. Referral to gynecology discussed with patient.      Continue all other maintenance medications.  Follow up plan: Return in 1 month (on 06/15/2021), or if symptoms worsen or fail to improve.   Continue healthy lifestyle choices, including diet (rich in fruits, vegetables, and lean proteins, and low in salt and simple carbohydrates) and exercise (at least 30 minutes of moderate physical activity daily).  Educational handout given for menorrhagia.   The above assessment and management plan was discussed with the patient. The patient verbalized understanding of and has agreed to the  management plan. Patient is aware to call the clinic if they develop any new symptoms or if symptoms persist or worsen. Patient is aware when to return to the clinic for a follow-up visit. Patient educated on when it is appropriate to go to the emergency department.   Addison Caris Cerveny, NP-S  I personally was  present during the history, physical exam, and medical decision-making activities of this visit and have verified that the services and findings are accurately documented in the nurse practitioner student's note.  Monia Pouch, FNP-C [facility] [address] [place], [place] [address] [phone]

## 2021-05-18 NOTE — Addendum Note (Signed)
Addended by: Baruch Gouty on: 05/18/2021 09:07 AM   Modules accepted: Orders

## 2021-05-24 ENCOUNTER — Other Ambulatory Visit: Payer: Self-pay

## 2021-05-24 ENCOUNTER — Ambulatory Visit (HOSPITAL_COMMUNITY)
Admission: RE | Admit: 2021-05-24 | Discharge: 2021-05-24 | Disposition: A | Discharge status: Home / Self Care | Payer: BC Managed Care – PPO | Source: Ambulatory Visit | Attending: Family Medicine | Admitting: Family Medicine

## 2021-05-24 DIAGNOSIS — N921 Excessive and frequent menstruation with irregular cycle: Secondary | ICD-10-CM | POA: Diagnosis not present

## 2021-05-24 DIAGNOSIS — N83202 Unspecified ovarian cyst, left side: Secondary | ICD-10-CM | POA: Diagnosis not present

## 2021-05-24 IMAGING — US US PELVIS COMPLETE
1 series · 13 of 25 positions shown · non-contrast
Comparison: None.

CLINICAL DATA: Abnormal menses

EXAM:
TRANSABDOMINAL ULTRASOUND OF PELVIS
TECHNIQUE: Transabdominal ultrasound examination of the pelvis was performed
including evaluation of the uterus, ovaries, adnexal regions, and
pelvic cul-de-sac.

[Series 1: us pelvis (transabdominal only) · 13 of 72 slices shown]
[im 1/72]
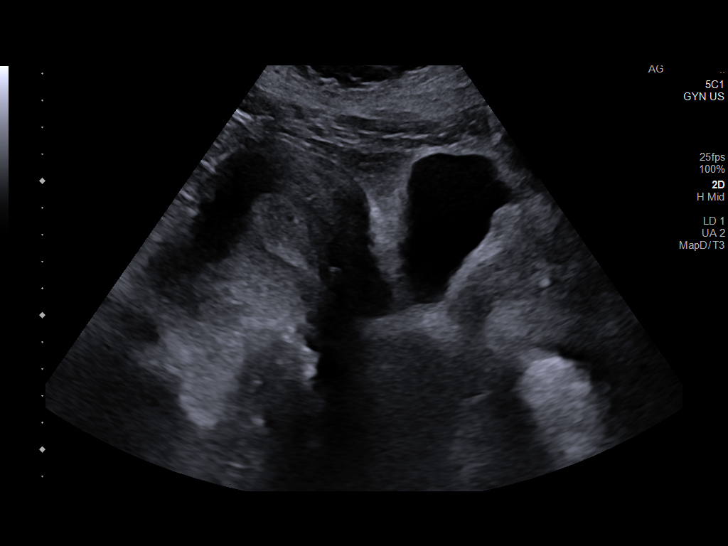
[im 6/72]
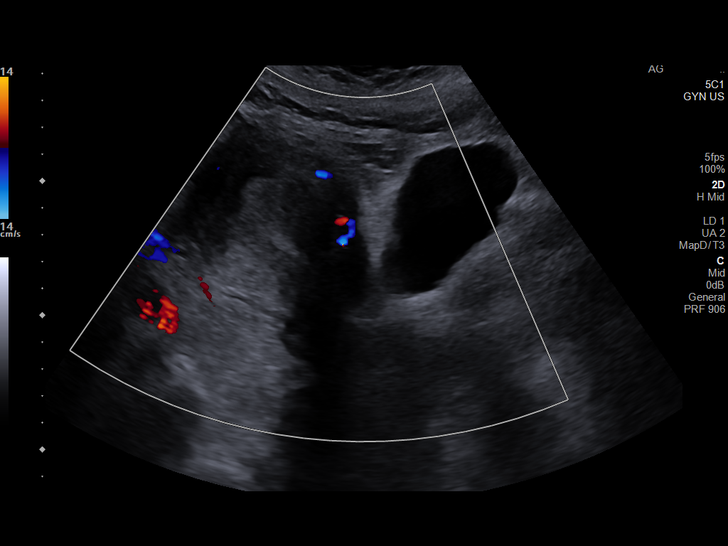
[im 12/72]
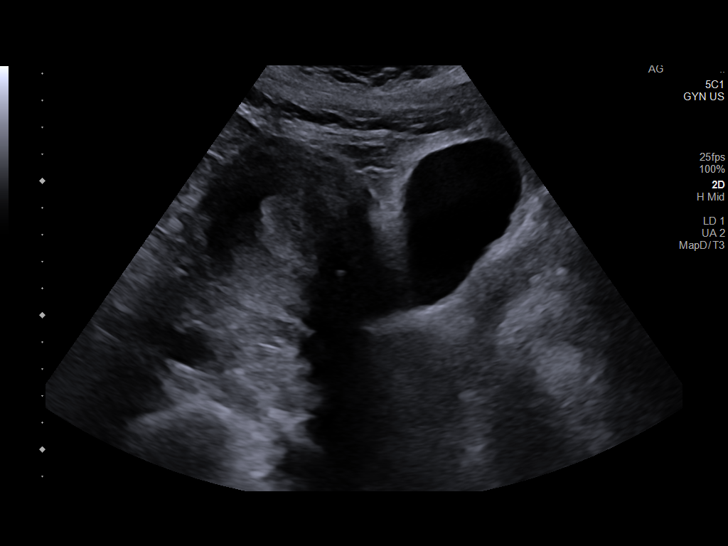
[im 18/72]
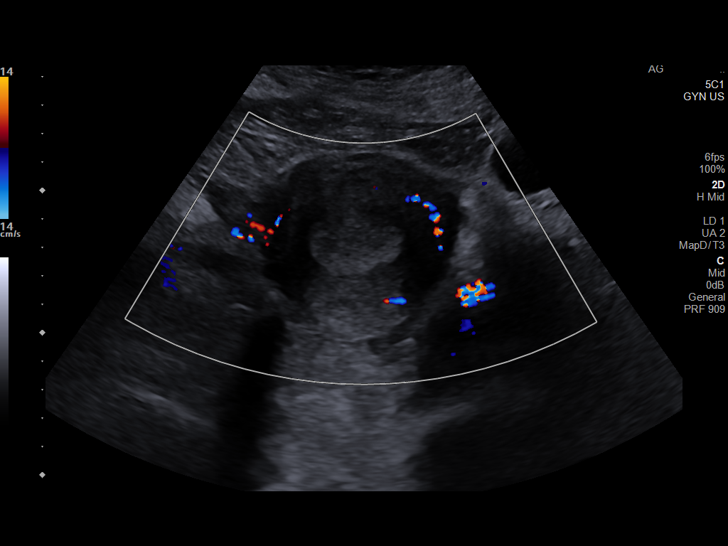
[im 24/72]
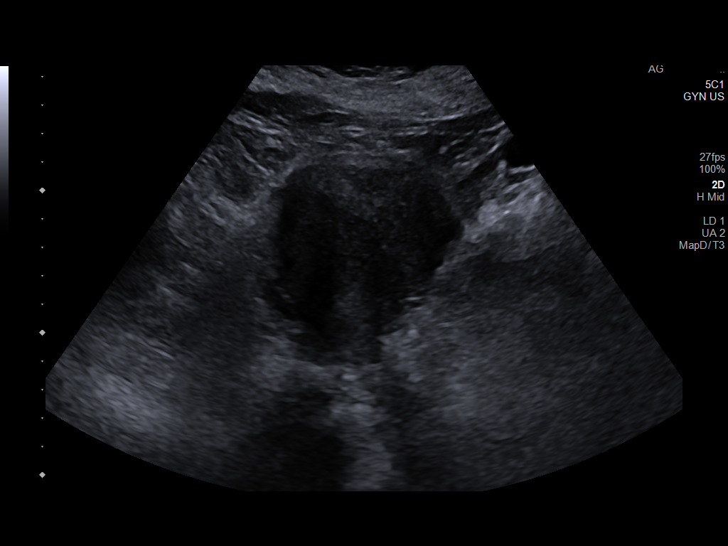
[im 30/72]
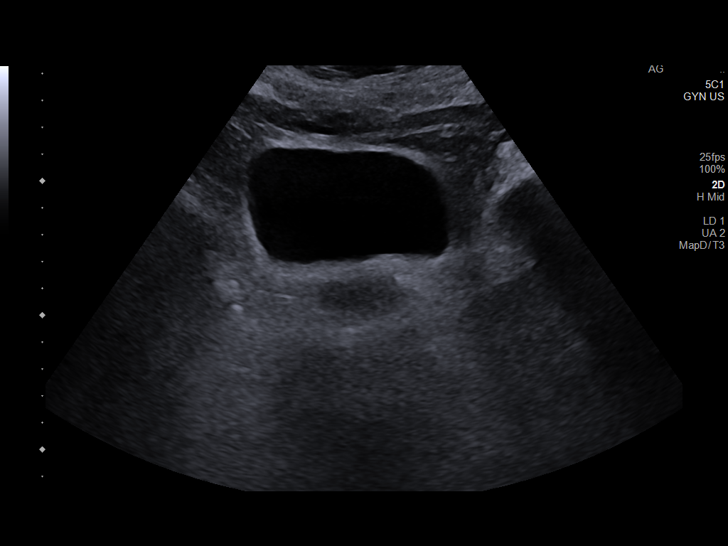
[im 36/72]
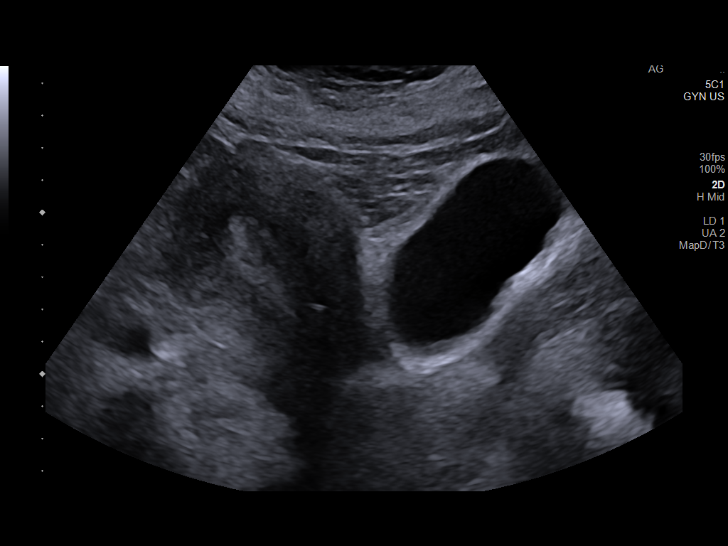
[im 42/72]
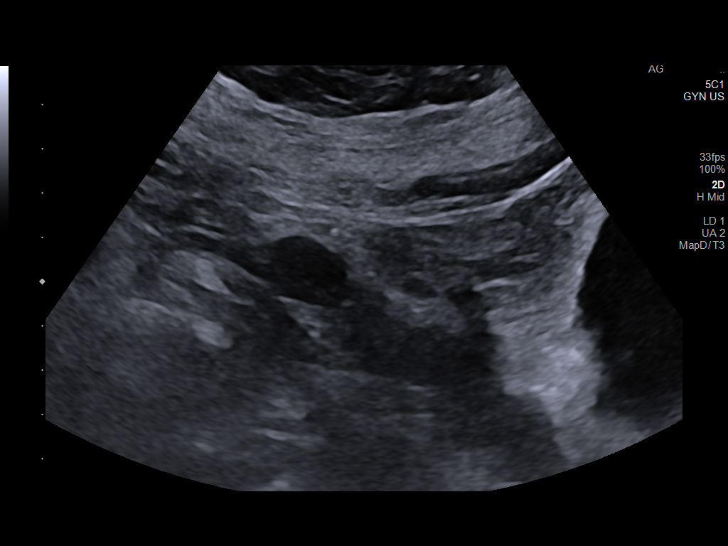
[im 48/72]
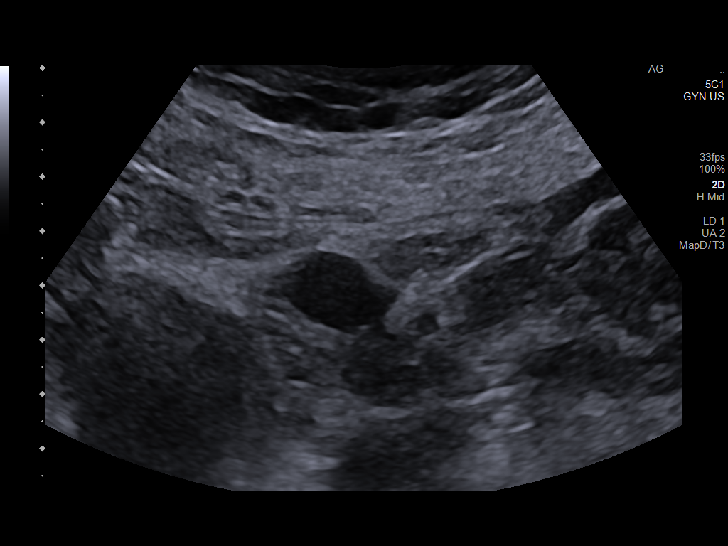
[im 54/72]
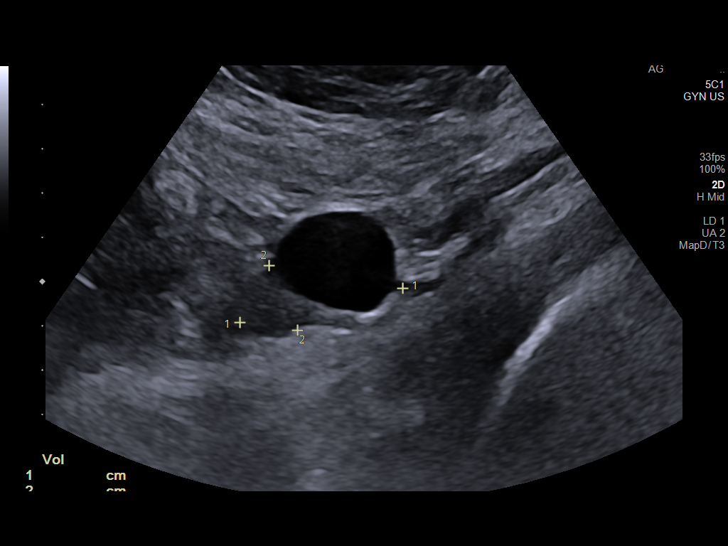
[im 60/72]
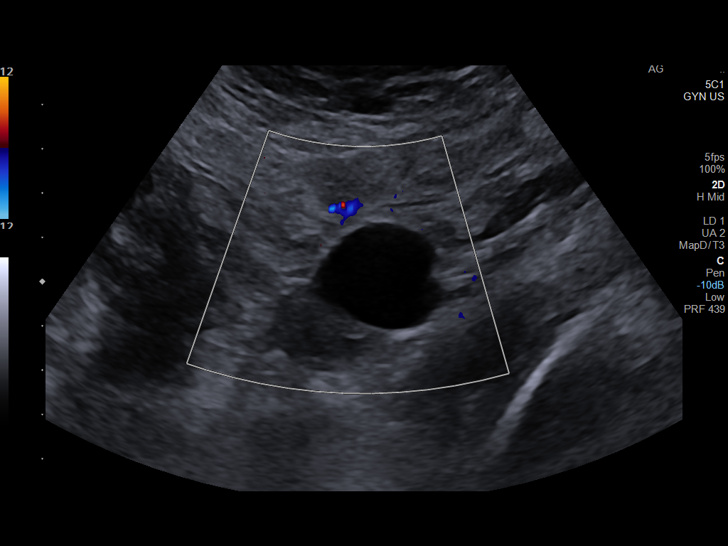
[im 66/72]
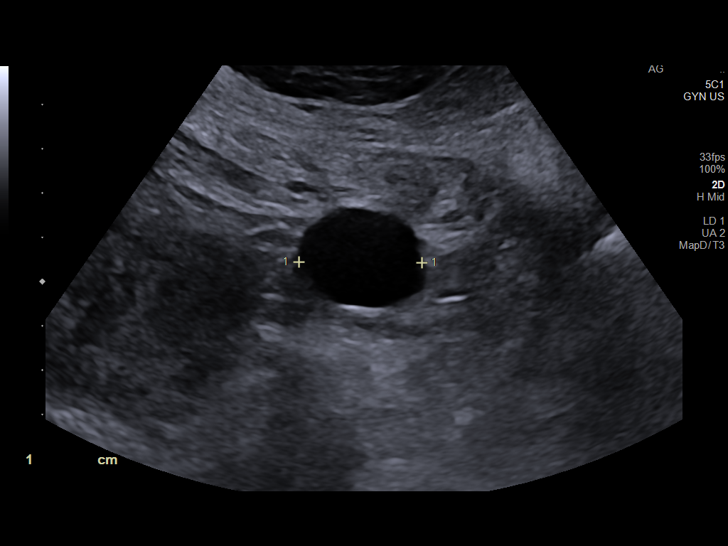
[im 72/72]
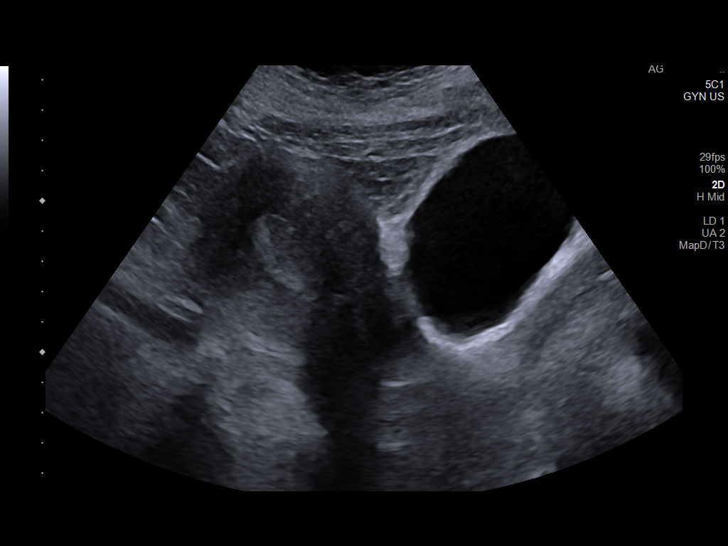

[13 of 25 positions shown; findings below may reference images not displayed]

FINDINGS: Uterus

Measurements: 11.2 by 5.9 x 7.2 cm = volume: 250.2 mL. No fibroids
or other mass visualized.

Endometrium

Thickness: 17.7 mm.  No focal abnormality visualized.

Right ovary

Measurements: 2.2 x 1.5 x 1.8 cm = volume: 3.0 mL. Normal
appearance/no adnexal mass.

Left ovary

Measurements: 3.8 x 1.6 x 2.7 cm = volume: 80.4 mL. Anechoic cyst
measuring 2.9 x 2.3 by 2.8 cm

Other findings:  No abnormal free fluid.
IMPRESSION: 1. Endometrial thickness of 17.7 mm. If bleeding remains
unresponsive to hormonal or medical therapy, focal lesion work-up
with sonohysterogram should be considered. Endometrial biopsy should
also be considered in pre-menopausal patients at high risk for
endometrial carcinoma. (Ref: Radiological Reasoning: Algorithmic
Workup of Abnormal Vaginal Bleeding with Endovaginal Sonography and
Sonohysterography. AJR [90]; 191:S68-73)
2. 2.9 cm left ovarian cyst. No followup imaging recommended. Note:
This recommendation does not apply to premenarchal patients or to
those with increased risk (genetic, family history, elevated tumor
markers or other high-risk factors) of ovarian cancer. Reference:
Radiology [DATE]):[id number].

## 2021-05-26 ENCOUNTER — Telehealth: Payer: Self-pay | Admitting: Family Medicine

## 2021-05-26 NOTE — Telephone Encounter (Signed)
Hasn't been addressed by provider yet. Je covering [employer]

## 2021-05-29 NOTE — Telephone Encounter (Signed)
Pt is aware of [place] result per provider feedback and also she does have appt with GYN scheduled 05/31/21.

## 2021-05-31 ENCOUNTER — Ambulatory Visit (INDEPENDENT_AMBULATORY_CARE_PROVIDER_SITE_OTHER): Payer: BC Managed Care – PPO | Admitting: Adult Health

## 2021-05-31 ENCOUNTER — Other Ambulatory Visit: Payer: Self-pay

## 2021-05-31 ENCOUNTER — Encounter: Payer: Self-pay | Admitting: Adult Health

## 2021-05-31 VITALS — BP 115/78 | HR 74 | Ht 70.0 in | Wt 234.0 lb

## 2021-05-31 DIAGNOSIS — N83202 Unspecified ovarian cyst, left side: Secondary | ICD-10-CM | POA: Diagnosis not present

## 2021-05-31 DIAGNOSIS — N939 Abnormal uterine and vaginal bleeding, unspecified: Secondary | ICD-10-CM

## 2021-05-31 MED ORDER — MEGESTROL ACETATE 40 MG PO TABS: ORAL_TABLET | ORAL | 0 refills | Status: DC

## 2021-05-31 NOTE — Progress Notes (Signed)
?Subjective:  ?  ? [name], female   DOB: 1975-08-04, 46 y.o.   MRN: [id number] ? ?HPI ?Emily Boone is a 46 year old white female, married, in complaining of bleeding since 05/02/21, and is having clots. Had HGB of 11.2 on 05/2621. Periods usually 3 days, but PMP was 9 days. ? And had [place] 05/24/21 that showed : ?Uterus: ?Measurements: 11.2 by 5.9 x 7.2 cm = volume: 250.2 mL. No fibroids ?or other mass visualized. ?  ?Endometrium ?  ?Thickness: 17.7 mm.  No focal abnormality visualized. ?  ?Right ovary ?  ?Measurements: 2.2 x 1.5 x 1.8 cm = volume: 3.0 mL. Normal ?appearance/no adnexal mass. ?  ?Left ovary ?  ?Measurements: 3.8 x 1.6 x 2.7 cm = volume: 80.4 mL. Anechoic cystmeasuring 2.9 x 2.3 by 2.8 cm ?  ?Other findings:  No abnormal free fluid. ? She had negative pap 06/18/19 with PCP ?PCP is Darla Lesches NP ? ?Review of Systems ?Bleeding since 05/02/21 with clots  ?   ?Objective:  ? Physical Exam ?BP 115/78 (BP Location: Right Arm, Patient Position: Sitting, Cuff Size: Normal)   Pulse 74   Ht 5\' 10"  (1.778 m)   Wt 234 lb (106.1 kg)   LMP 05/02/2021   BMI 33.58 kg/m?   ?  Skin warm and dry.Pelvic: external genitalia is normal in appearance no lesions, vagina has dark period like blood and clots,urethra has no lesions or masses noted, cervix:smooth and bulbous, uterus: normal size, shape and contour, non tender, no masses felt, adnexa: no masses or tenderness noted. Bladder is non tender and no masses felt.  ?AA is 1 ?Fall risk is low ?Depression screen [facility] 2/9 05/31/2021 05/18/2021 03/09/2021  ?Decreased Interest 0 0 0  ?Down, Depressed, Hopeless 0 0 0  ?PHQ - 2 Score 0 0 0  ?Altered sleeping 0 - 0  ?Tired, decreased energy 1 - 1  ?Change in appetite 0 - 1  ?Feeling bad or failure about yourself  0 - 0  ?Trouble concentrating 0 - 0  ?Moving slowly or fidgety/restless 0 - 0  ?Suicidal thoughts 0 - 0  ?PHQ-9 Score 1 - 2  ?Difficult doing work/chores - - Not difficult at all  ?  ?GAD 7 : Generalized Anxiety  Score 05/31/2021 03/09/2021 11/18/2020  ?Nervous, Anxious, on Edge 0 0 0  ?Control/stop worrying 0 0 0  ?Worry too much - different things 0 0 0  ?Trouble relaxing 0 0 0  ?Restless 0 0 0  ?Easily annoyed or irritable 0 0 0  ?Afraid - awful might happen 0 0 0  ?Total GAD 7 Score 0 0 0  ?Anxiety Difficulty - Not difficult at all Not difficult at all  ? ?  ? Upstream - 05/31/21 0908   ? ?  ? Pregnancy Intention Screening  ? Does the patient want to become pregnant in the next year? No   ? Does the patient's partner want to become pregnant in the next year? No   ? Would the patient like to discuss contraceptive options today? No   ?  ? Contraception Wrap Up  ? Current Method Female Sterilization   ? End Method Female Sterilization   ? Contraception Counseling Provided No   ? ?  ?  ? ?  ? Examination chaperoned by Balinda Quails NP student. ? ?Assessment:  ?   ?1. Abnormal uterine bleeding (AUB) ?Will try megace to stop bleeding ?Meds ordered this encounter  ?Medications  ? megestrol (MEGACE)  40 MG tablet  ?  Sig: Take 3 x 5 days then 2 x 5 days then 1 daily til bleeding stops  ?  Dispense:  45 tablet  ?  Refill:  0  ?  Order Specific Question:   Supervising Provider  ?  Answer:   Tania Ade H [[id number]]  ? Follow up in 9 days with me to assess bleeding  ? ?2. Cyst of left ovary ? ?   ?Plan:  ?   ?Follow up in 9 days, will get Follow up US in about 4 weeks  ?   ?

## 2021-06-09 ENCOUNTER — Other Ambulatory Visit: Payer: Self-pay

## 2021-06-09 ENCOUNTER — Ambulatory Visit (INDEPENDENT_AMBULATORY_CARE_PROVIDER_SITE_OTHER): Payer: BC Managed Care – PPO | Admitting: Adult Health

## 2021-06-09 ENCOUNTER — Encounter: Payer: Self-pay | Admitting: Adult Health

## 2021-06-09 VITALS — BP 113/75 | HR 57 | Ht 70.0 in | Wt 234.0 lb

## 2021-06-09 DIAGNOSIS — N939 Abnormal uterine and vaginal bleeding, unspecified: Secondary | ICD-10-CM | POA: Diagnosis not present

## 2021-06-09 DIAGNOSIS — N83202 Unspecified ovarian cyst, left side: Secondary | ICD-10-CM | POA: Diagnosis not present

## 2021-06-09 MED ORDER — MEGESTROL ACETATE 40 MG PO TABS: ORAL_TABLET | ORAL | 1 refills | Status: DC

## 2021-06-09 NOTE — Progress Notes (Signed)
?  Subjective:  ?  ? [name], female   DOB: 1975-06-28, 46 y.o.   MRN: [id number] ? ?HPI ?Emily Boone is a 46 year old white female, married, [id number], back in follow up on taking megace to stop bleeding. And bleeding stop after 1 day of megace.  ?She had [place] in 05/24/21 that showed left ovary cyst, that needs follow up US. ?Had pap 06/18/19, normal. ?PCP is Emily Lesches NP. ?Review of Systems ?Bleeding has stopped ?Reviewed past medical,surgical, social and family history. Reviewed medications and allergies.  ?   ?Objective:  ? Physical Exam ?BP 113/75 (BP Location: Left Arm, Patient Position: Sitting, Cuff Size: Large)   Pulse (!) 57   Ht '5\' 10"'$  (1.778 m)   Wt 234 lb (106.1 kg)   LMP 05/31/2021 (Exact Date)   BMI 33.58 kg/m?   ?  Skin warm and dry.  Lungs: clear to ausculation bilaterally. Cardiovascular: regular rate and rhythm.  ? Upstream - 06/09/21 0918   ? ?  ? Pregnancy Intention Screening  ? Does the patient want to become pregnant in the next year? N/A   ? Does the patient's partner want to become pregnant in the next year? N/A   ? Would the patient like to discuss contraceptive options today? N/A   ?  ? Contraception Wrap Up  ? Current Method Female Sterilization   ? End Method Female Sterilization   ? Contraception Counseling Provided No   ? ?  ?  ? ?  ?  ?Assessment:  ?   ?1. Cyst of left ovary ?Will get pelvic US 06/29/21 to see if resolved, and see me after ? ?2. Abnormal uterine bleeding (AUB) ?Bleeding has stopped, will continue megace 1 daily for now ?Meds ordered this encounter  ?Medications  ? megestrol (MEGACE) 40 MG tablet  ?  Sig: Take 3 x 5 days then 2 x 5 days then 1 daily til bleeding stops  ?  Dispense:  45 tablet  ?  Refill:  1  ?  Order Specific Question:   Supervising Provider  ?  Answer:   Tania Ade H [[id number]]  ?  ?   ?Plan:  ?   ?Return 06/29/21 for [place] and see me after  ?   ?

## 2021-06-15 ENCOUNTER — Ambulatory Visit: Payer: BC Managed Care – PPO | Admitting: Family Medicine

## 2021-07-03 ENCOUNTER — Encounter: Payer: Self-pay | Admitting: Adult Health

## 2021-07-03 ENCOUNTER — Ambulatory Visit (INDEPENDENT_AMBULATORY_CARE_PROVIDER_SITE_OTHER): Payer: BC Managed Care – PPO | Admitting: Adult Health

## 2021-07-03 ENCOUNTER — Ambulatory Visit (INDEPENDENT_AMBULATORY_CARE_PROVIDER_SITE_OTHER): Payer: BC Managed Care – PPO

## 2021-07-03 VITALS — BP 117/79 | HR 68 | Ht 70.0 in | Wt 233.0 lb

## 2021-07-03 DIAGNOSIS — R1032 Left lower quadrant pain: Secondary | ICD-10-CM

## 2021-07-03 DIAGNOSIS — Z8742 Personal history of other diseases of the female genital tract: Secondary | ICD-10-CM | POA: Diagnosis not present

## 2021-07-03 DIAGNOSIS — N939 Abnormal uterine and vaginal bleeding, unspecified: Secondary | ICD-10-CM

## 2021-07-03 DIAGNOSIS — N83202 Unspecified ovarian cyst, left side: Secondary | ICD-10-CM

## 2021-07-03 NOTE — Progress Notes (Signed)
PELVIC US TA/TV: heterogeneous anteverted uterus,EEC 15 mm,small amount of simple fluid within the endometrium,normal ovaries,ovaries appear mobile,no free fluid,no pain during ultrasound ? ?Chaperone Angel  ?

## 2021-07-03 NOTE — Progress Notes (Signed)
?  Subjective:  ?  ? [name], female   DOB: 1976-02-15, 46 y.o.   MRN: [id number] ? ?HPI ?Emily Boone is a 46 year old white female,married, [id number], in for [place] to assess left ovarian cyst. ?PCP is [employer].  ? ?Review of Systems ?Bleeding has stopped, on megace ?Has LLQ pain, hurts to lay, and with [place] today ?Reviewed past medical,surgical, social and family history. Reviewed medications and allergies.  ?   ?Objective:  ? Physical Exam ?BP 117/79 (BP Location: Left Arm, Patient Position: Sitting, Cuff Size: Large)   Pulse 68   Ht '5\' 10"'$  (1.778 m)   Wt 233 lb (105.7 kg)   BMI 33.43 kg/m?   ?  Skin warm and dry.  Lungs: clear to ausculation bilaterally. Cardiovascular: regular rate and rhythm.  US showed heterogeneous enlarged uterus, EEC 15 mm, with simple fluid, and ovaries looked normal ?Fall risk is low ? Upstream - 07/03/21 1643   ? ?  ? Pregnancy Intention Screening  ? Does the patient want to become pregnant in the next year? No   ? Does the patient's partner want to become pregnant in the next year? No   ? Would the patient like to discuss contraceptive options today? No   ?  ? Contraception Wrap Up  ? Current Method Female Sterilization   ? End Method Female Sterilization   ? Contraception Counseling Provided No   ? ?  ?  ? ?  ?  ?Assessment:  ?   ?1. Abnormal uterine bleeding (AUB) ?Bleeding has stopped with megace, still taking 1 daily ? ? ?2. LLQ pain ?Has pain with laying and hurt with [place] today ? ? ?3. History of ovarian cyst ?Resolved on [place] today    ?Plan:  ?   ?Follow up to TBD after [place] results read by MD ?   ?

## 2021-07-06 ENCOUNTER — Telehealth: Payer: Self-pay | Admitting: Adult Health

## 2021-07-06 NOTE — Telephone Encounter (Signed)
Patient called wanting to see if MD has had time to look at her ultrasounds. Please advise.  ?

## 2021-07-06 NOTE — Telephone Encounter (Signed)
Pt aware that Dr. Elonda Husky should look at [place] today. Pt voiced understanding. JSY ?

## 2021-07-12 ENCOUNTER — Telehealth: Payer: Self-pay

## 2021-07-12 NOTE — Telephone Encounter (Signed)
Discussed with  Anderson Malta, ultrasound has not been read yet but will have Dr. Nelda Marseille look at it today. Called patient and informed her of this. She had no other questions at this time. Will call back once ultrasound has been reviewed.  ?

## 2021-07-12 NOTE — Telephone Encounter (Signed)
Since [place] was normal, I think could be bowel related, give Dr Abbey Chatters a call in follow up. See me towards middle to end of May.  ?

## 2021-07-12 NOTE — Telephone Encounter (Signed)
PT CALLED AND STATED THAT SHE WAS TOLD THAT SOMEONE WOULD GET BACK WITH HER LAST WEEK ABOUT THE RESULTS OF HER ULTRASOUND AND WANTED TO SPEAK TO SOMEONE. ?

## 2021-07-12 NOTE — Telephone Encounter (Signed)
Called patient back and informed her of normal ultrasound. She is concerned because she still has a lot of pain on the left side. She wants to know what to do about it and what else it could be.  ?

## 2021-07-24 ENCOUNTER — Encounter: Payer: Self-pay | Admitting: Family Medicine

## 2021-07-24 ENCOUNTER — Ambulatory Visit (INDEPENDENT_AMBULATORY_CARE_PROVIDER_SITE_OTHER): Payer: BC Managed Care – PPO | Admitting: Family Medicine

## 2021-07-24 VITALS — BP 127/77 | HR 80 | Temp 98.5°F | Ht 70.0 in | Wt 234.1 lb

## 2021-07-24 DIAGNOSIS — H6503 Acute serous otitis media, bilateral: Secondary | ICD-10-CM

## 2021-07-24 MED ORDER — FLUCONAZOLE 150 MG PO TABS: ORAL_TABLET | ORAL | 0 refills | Status: DC

## 2021-07-24 MED ORDER — DOXYCYCLINE HYCLATE 100 MG PO TABS: 100.0000 mg | ORAL_TABLET | Freq: Two times a day (BID) | ORAL | 0 refills | Status: AC

## 2021-07-24 NOTE — Progress Notes (Signed)
? ?  Acute Office Visit ? ?Subjective:  ? ?  ?[name], female    DOB: 06/29/1975, 46 y.o.   MRN: [id number] ? ?Chief Complaint  ?Patient presents with  ? Ear Problem  ? ? ?HPI ?Patient is in today for ears popping x 1 day with pain. Throat feels irritation and has runny nose for a few days Reports chills and body aches x 2 days. She has not check her temperature. She denies vomiting, diarrhea, wheezing, or shortness  of breath. She has been taking flonase for her symptoms.  ? ?ROS ?As per HPI.  ? ?   ?Objective:  ?  ?BP 127/77   Pulse 80   Temp 98.5 ?F (36.9 ?C) (Temporal)   Ht '5\' 10"'$  (1.778 m)   Wt 234 lb 2 oz (106.2 kg)   BMI 33.59 kg/m?  ? ? ?Physical Exam ?Vitals and nursing note reviewed.  ?Constitutional:   ?   General: She is not in acute distress. ?   Appearance: She is not ill-appearing, toxic-appearing or diaphoretic.  ?HENT:  ?   Right Ear: Ear canal and external ear normal. A middle ear effusion is present. Tympanic membrane is bulging. Tympanic membrane is not perforated or erythematous.  ?   Left Ear: Ear canal and external ear normal. A middle ear effusion is present. Tympanic membrane is bulging. Tympanic membrane is not perforated or erythematous.  ?   Mouth/Throat:  ?   Mouth: Mucous membranes are moist.  ?   Pharynx: Oropharynx is clear.  ?Eyes:  ?   Extraocular Movements: Extraocular movements intact.  ?   Conjunctiva/sclera: Conjunctivae normal.  ?   Pupils: Pupils are equal, round, and reactive to light.  ?Cardiovascular:  ?   Rate and Rhythm: Normal rate and regular rhythm.  ?   Heart sounds: Normal heart sounds. No murmur heard. ?Pulmonary:  ?   Effort: Pulmonary effort is normal. No respiratory distress.  ?   Breath sounds: Normal breath sounds.  ?Musculoskeletal:  ?   Right lower leg: No edema.  ?   Left lower leg: No edema.  ?Skin: ?   General: Skin is warm and dry.  ?Neurological:  ?   Mental Status: She is alert and oriented to person, place, and time.  ?Psychiatric:      ?   Mood and Affect: Mood normal.     ?   Behavior: Behavior normal.     ?   Thought Content: Thought content normal.     ?   Judgment: Judgment normal.  ? ? ?No results found for any visits on 07/24/21. ? ? ?   ?Assessment & Plan:  ? ?Emily Boone was seen today for ear problem. ? ?Diagnoses and all orders for this visit: ? ?Non-recurrent acute serous otitis media of both ears ?Doxycycline ordered due to PCN allergy. Diflucan ordered as patient often has yeast infection after abx use.  ?-     doxycycline (VIBRA-TABS) 100 MG tablet; Take 1 tablet (100 mg total) by mouth 2 (two) times daily for 7 days. ?-     fluconazole (DIFLUCAN) 150 MG tablet; Take one tablet by mouth and repeat in 3 days. ? ?Return if symptoms worsen or fail to improve. ? ?The patient indicates understanding of these issues and agrees with the plan. ? ?Gwenlyn Perking, FNP ? ? ?

## 2021-07-24 NOTE — Patient Instructions (Signed)

## 2021-09-06 ENCOUNTER — Ambulatory Visit: Payer: BC Managed Care – PPO | Admitting: Family Medicine

## 2021-09-19 ENCOUNTER — Telehealth: Payer: Self-pay

## 2021-09-19 MED ORDER — MEGESTROL ACETATE 40 MG PO TABS: ORAL_TABLET | ORAL | 1 refills | Status: DC

## 2021-09-19 NOTE — Telephone Encounter (Signed)
Rx sent for megace 40 mg 2 daily

## 2021-09-19 NOTE — Telephone Encounter (Signed)
Patient would like more Megestrol called in, until her appointment on Thursday.  Pt stated the Emily Boone told her to double up on it and she will be out of if before her appointment.

## 2021-09-28 ENCOUNTER — Ambulatory Visit (INDEPENDENT_AMBULATORY_CARE_PROVIDER_SITE_OTHER): Payer: BC Managed Care – PPO | Admitting: Adult Health

## 2021-09-28 ENCOUNTER — Encounter: Payer: Self-pay | Admitting: Adult Health

## 2021-09-28 VITALS — BP 119/80 | HR 72 | Ht 70.0 in | Wt 234.0 lb

## 2021-09-28 DIAGNOSIS — N946 Dysmenorrhea, unspecified: Secondary | ICD-10-CM

## 2021-09-28 DIAGNOSIS — N939 Abnormal uterine and vaginal bleeding, unspecified: Secondary | ICD-10-CM

## 2021-09-28 DIAGNOSIS — Z3202 Encounter for pregnancy test, result negative: Secondary | ICD-10-CM

## 2021-09-28 LAB — POCT URINE PREGNANCY: Preg Test, Ur: NEGATIVE

## 2021-09-28 MED ORDER — NORETHINDRONE ACETATE 5 MG PO TABS: 5.0000 mg | ORAL_TABLET | Freq: Every day | ORAL | 1 refills | Status: DC

## 2021-09-28 NOTE — Progress Notes (Signed)
  Subjective:     [name], female   DOB: [birth year], 46 y.o.   MRN: [id number]  HPI Emily Boone is a 46 year old white female, married, [id number] back in follow up on taking megace for bleeding and it is back and  sporadic, and will be heavy for like 10 minutes and stop and has after sex, looks chunky at times, and has cramping. She had normal [place] 07/12/21. She is going to [employer] next week.  PCP is Darla Lesches NP  Last pap was 06/18/19 negative HPV and Malignancy. Negative mammogram 03/01/21  Review of Systems Sporadic bleeding and Cramping Having hot flashes now too Reviewed past medical,surgical, social and family history. Reviewed medications and allergies.     Objective:   Physical Exam BP 119/80 (BP Location: Left Arm, Patient Position: Sitting, Cuff Size: Large)   Pulse 72   Ht '5\' 10"'$  (1.778 m)   Wt 234 lb (106.1 kg)   BMI 33.58 kg/m  UPT is negative  Skin warm and dry.Pelvic: external genitalia is normal in appearance no lesions, vagina: pink,urethra has no lesions or masses noted, cervix is  bulbous, uterus: normal size, shape and contour, non tender, no masses felt, adnexa: no masses or tenderness noted. Bladder is non tender and no masses felt.    Pt gave verbal consent for exam without chaperone. Fall risk is low  Upstream - 09/28/21 1113       Pregnancy Intention Screening   Does the patient want to become pregnant in the next year? No    Does the patient's partner want to become pregnant in the next year? No    Would the patient like to discuss contraceptive options today? No      Contraception Wrap Up   Current Method Female Sterilization    End Method Female Sterilization             Assessment:     1. Pregnancy examination or test, negative result   2. Abnormal uterine bleeding (AUB) Has sporadic bleeding  On megace 80 mg daily Discussed with Dr Nelda Marseille will stop megace and start Aysgestin 5 mg 1 daily Meds ordered this encounter   Medications   norethindrone (AYGESTIN) 5 MG tablet    Sig: Take 1 tablet (5 mg total) by mouth daily.    Dispense:  30 tablet    Refill:  1    Order Specific Question:   Supervising Provider    Answer:   Florian Buff [[id number]]    Will get endometrial biopsy 10/11/21 with Dr Nelda Marseille  3. Menstrual cramps     Plan:     Return in 10/11/21 for endometrial biopsy with Dr Nelda Marseille

## 2021-10-11 ENCOUNTER — Other Ambulatory Visit (HOSPITAL_COMMUNITY)
Admission: RE | Admit: 2021-10-11 | Discharge: 2021-10-11 | Disposition: A | Discharge status: Home / Self Care | Payer: BC Managed Care – PPO | Source: Ambulatory Visit | Attending: Obstetrics & Gynecology | Admitting: Obstetrics & Gynecology

## 2021-10-11 ENCOUNTER — Ambulatory Visit (INDEPENDENT_AMBULATORY_CARE_PROVIDER_SITE_OTHER): Payer: BC Managed Care – PPO | Admitting: Obstetrics & Gynecology

## 2021-10-11 ENCOUNTER — Encounter: Payer: Self-pay | Admitting: Obstetrics & Gynecology

## 2021-10-11 VITALS — BP 121/87 | HR 75 | Ht 70.0 in | Wt 232.0 lb

## 2021-10-11 DIAGNOSIS — N939 Abnormal uterine and vaginal bleeding, unspecified: Secondary | ICD-10-CM

## 2021-10-11 DIAGNOSIS — Z3202 Encounter for pregnancy test, result negative: Secondary | ICD-10-CM

## 2021-10-11 LAB — POCT URINE PREGNANCY: Preg Test, Ur: NEGATIVE

## 2021-10-11 NOTE — Progress Notes (Signed)
GYN VISIT Patient name: Emily Boone MRN [id number]  Date of birth: [birth year] Chief Complaint:   Procedure (Endometrial biopsy)  History of Present Illness:   Abbey Kustra is a 46 y.o. [id number] female being seen today for AUB  AUB: This has been going on since January- noted passage of large clots and prolonged bleeding.  Electa Sniff, started treatment with  Megace, which helped for a few mos. In June would have random "explosions" where she would have bleeding heavy for a short time (less than hour) and then resolve.  Pt last seen 6/29 transitioned from Megace to Norethindrone daily.  With this pill, will continue to have sporadic bleeding with increased activity or when standing for a prolonged period.  Additionally, she now has daily spotting.  Notes constant cramping, rates her pain 4/10.  Since taking the medication feels like the cramping has gotten worse.  Tries not to take medication for the cramping.  Once in a while will take pain medication to help her sleep.  Denies dyspareunia.  Denies urinary or bowel complaints.  Prior tubal ligation for contraception.  Prior deliveries all vaginal  Pelvic US completed 07/2021: 8.7cm uterus with no abnormalities.  Normal ovaries bilaterally.  Endometrium 47m  No LMP recorded. (Menstrual status: Other).     07/24/2021    1:42 PM 05/31/2021    9:08 AM 05/18/2021    8:29 AM 03/09/2021    8:17 AM 11/18/2020    8:10 AM  Depression screen PHQ 2/9  Decreased Interest 0 0 0 0 0  Down, Depressed, Hopeless 0 0 0 0 0  PHQ - 2 Score 0 0 0 0 0  Altered sleeping 0 0  0 1  Tired, decreased energy 0 '1  1 1  '$ Change in appetite 0 0  1 0  Feeling bad or failure about yourself  0 0  0 0  Trouble concentrating 0 0  0 0  Moving slowly or fidgety/restless 0 0  0 0  Suicidal thoughts 0 0  0 0  PHQ-9 Score 0 '1  2 2  '$ Difficult doing work/chores Not difficult at all   Not difficult at all Not difficult at all     Review of Systems:   Pertinent items  are noted in HPI Denies fever/chills, dizziness, headaches, visual disturbances, fatigue, shortness of breath, chest pain, abdominal pain, vomiting, denies problems with bowel movements, urination, or intercourse unless otherwise stated above.  Pertinent History Reviewed:  Reviewed past medical,surgical, social, obstetrical and family history.  Reviewed problem list, medications and allergies. Physical Assessment:   Vitals:   10/11/21 1052  BP: 121/87  Pulse: 75  Weight: 232 lb (105.2 kg)  Height: '5\' 10"'$  (1.778 m)  Body mass index is 33.29 kg/m.       Physical Examination:   General appearance: alert, well appearing, and in no distress  Psych: mood appropriate, normal affect  Skin: warm & dry   Cardiovascular: normal heart rate noted  Respiratory: normal respiratory effort, no distress  Abdomen: soft, non-tender   Pelvic: normal external genitalia, vulva, vagina, cervix, uterus and adnexa  Extremities: no edema   Chaperone: ACelene Squibb   Endometrial Biopsy Procedure Note  Pre-operative Diagnosis: AUB  Post-operative Diagnosis: same  Procedure Details   Urine pregnancy test was negative.  The risks (including infection, bleeding, pain, and uterine perforation) and benefits of the procedure were explained to the patient and Written informed consent was obtained.     The patient was placed  in the dorsal lithotomy position.  Bimanual exam showed the uterus to be in the neutral position.  A speculum inserted in the vagina, and the cervix prepped with betadine.     A single tooth tenaculum was applied to the anterior lip of the cervix for stabilization.  A sterile uterine sound was used to sound the uterus to a depth of 9cm.  A Pipelle endometrial aspirator was used to sample the endometrium.  Sample was sent for pathologic examination.  Condition: Stable  Complications: None   Assessment & Plan:  1) AUB -reviewed management options- since it has been less than a month  on current pill, plan to continue for at least 2-3 mos before making a change -EMB completed as above, pathology pending -reviewed alternative options including transitioning to IUD or possible surgical intervention.  Will review next step pending pathology and in a few mos as discussed -questions and concerns were addressed, pt agreeable to above   Orders Placed This Encounter  Procedures   POCT urine pregnancy    Return in about 2 months (around 12/12/2021) for Medication follow up, with Dr. Roselee Nova.   Janyth Pupa, DO Attending [place], [facility] for [employer], [place]

## 2021-10-12 ENCOUNTER — Encounter: Payer: Self-pay | Admitting: Obstetrics & Gynecology

## 2021-10-13 LAB — SURGICAL PATHOLOGY

## 2021-10-14 ENCOUNTER — Emergency Department (HOSPITAL_COMMUNITY)
Admission: EM | Admit: 2021-10-14 | Discharge: 2021-10-14 | Disposition: A | Discharge status: Home / Self Care | Payer: BC Managed Care – PPO | Attending: Emergency Medicine | Admitting: Emergency Medicine

## 2021-10-14 ENCOUNTER — Other Ambulatory Visit: Payer: Self-pay

## 2021-10-14 ENCOUNTER — Encounter (HOSPITAL_COMMUNITY): Payer: Self-pay

## 2021-10-14 DIAGNOSIS — N898 Other specified noninflammatory disorders of vagina: Secondary | ICD-10-CM | POA: Diagnosis not present

## 2021-10-14 DIAGNOSIS — R109 Unspecified abdominal pain: Secondary | ICD-10-CM

## 2021-10-14 DIAGNOSIS — R102 Pelvic and perineal pain: Secondary | ICD-10-CM | POA: Insufficient documentation

## 2021-10-14 LAB — URINALYSIS, ROUTINE W REFLEX MICROSCOPIC
Bacteria, UA: NONE SEEN
Bilirubin Urine: NEGATIVE
Glucose, UA: NEGATIVE mg/dL
Ketones, ur: NEGATIVE mg/dL
Leukocytes,Ua: NEGATIVE
Nitrite: NEGATIVE
Protein, ur: 100 mg/dL — AB
RBC / HPF: >50 RBC/hpf — ABNORMAL HIGH (ref 0–5)
Specific Gravity, Urine: 1.017 (ref 1.005–1.030)
pH: 5 (ref 5.0–8.0)

## 2021-10-14 LAB — POC URINE PREG, ED: Preg Test, Ur: NEGATIVE

## 2021-10-14 NOTE — ED Provider Notes (Signed)
[facility] Provider Note   CSN: [id number] Arrival date & time: 10/14/21  1419     History  Chief Complaint  Patient presents with   Pelvic Pain    Emily Boone is a 46 y.o. female.  Patient status post endometrial biopsy on July 12 at [facility].  Patient today at about 1330 got severe abdominal cramping passed a large amount of tissue no real bleeding.  Still having some lower abdominal cramping.  Again no bleeding.  No nausea or vomiting.  Says she passed a golf ball size of tissue.  Prior to this patient was having trouble with abnormal vaginal bleeding was on Megace.  And then decided to do the endometrial biopsy.  Endometrial biopsy was done by Dr. Nelda Marseille.       Home Medications Prior to Admission medications   Medication Sig Start Date End Date Taking? Authorizing Provider  atorvastatin (LIPITOR) 40 MG tablet Take 1 tablet (40 mg total) by mouth daily. Patient taking differently: Take 40 mg by mouth at bedtime. 03/10/21  Yes Rakes, Connye Burkitt, FNP  fluticasone (FLONASE) 50 MCG/ACT nasal spray Place 2 sprays into both nostrils daily. Patient taking differently: Place 2 sprays into both nostrils daily as needed for allergies. 03/09/21  Yes Rakes, Connye Burkitt, FNP  ibuprofen (ADVIL) 200 MG tablet Take 400-800 mg by mouth every 8 (eight) hours as needed (for fibromyalgia pain).   Yes [provider]  loratadine (CLARITIN) 10 MG tablet Take 10 mg by mouth daily as needed for allergies.   Yes [provider]  norethindrone (AYGESTIN) 5 MG tablet Take 1 tablet (5 mg total) by mouth daily. 09/28/21  Yes Estill Dooms, NP  omeprazole (PRILOSEC) 20 MG capsule Take 20 mg by mouth 2 (two) times daily as needed (indigestion/heartburn).   Yes [provider]      Allergies    Clindamycin/lincomycin, Morphine and related, and Penicillins    Review of Systems   Review of Systems  Constitutional:  Negative for chills and fever.   HENT:  Negative for ear pain and sore throat.   Eyes:  Negative for pain and visual disturbance.  Respiratory:  Negative for cough and shortness of breath.   Cardiovascular:  Negative for chest pain and palpitations.  Gastrointestinal:  Positive for abdominal pain. Negative for vomiting.  Genitourinary:  Positive for vaginal discharge. Negative for dysuria, hematuria and vaginal bleeding.  Musculoskeletal:  Negative for arthralgias and back pain.  Skin:  Negative for color change and rash.  Neurological:  Negative for seizures and syncope.  All other systems reviewed and are negative.   Physical Exam Updated Vital Signs BP 124/77   Pulse 69   Temp 98.1 F (36.7 C) (Oral)   Resp 18   Ht 1.778 m ('5\' 10"'$ )   Wt 105.2 kg   SpO2 100%   BMI 33.29 kg/m  Physical Exam Vitals and nursing note reviewed.  Constitutional:      General: She is not in acute distress.    Appearance: Normal appearance. She is well-developed.  HENT:     Head: Normocephalic and atraumatic.  Eyes:     Conjunctiva/sclera: Conjunctivae normal.     Pupils: Pupils are equal, round, and reactive to light.  Cardiovascular:     Rate and Rhythm: Normal rate and regular rhythm.     Heart sounds: No murmur heard. Pulmonary:     Effort: Pulmonary effort is normal. No respiratory distress.     Breath  sounds: Normal breath sounds.  Abdominal:     General: There is no distension.     Palpations: Abdomen is soft.     Tenderness: There is no abdominal tenderness. There is no guarding.  Musculoskeletal:        General: No swelling.     Cervical back: Neck supple.  Skin:    General: Skin is warm and dry.     Capillary Refill: Capillary refill takes less than 2 seconds.  Neurological:     General: No focal deficit present.     Mental Status: She is alert and oriented to person, place, and time.  Psychiatric:        Mood and Affect: Mood normal.     ED Results / Procedures / Treatments   Labs (all labs ordered  are listed, but only abnormal results are displayed) Labs Reviewed  URINALYSIS, ROUTINE W REFLEX MICROSCOPIC - Abnormal; Notable for the following components:      Result Value   APPearance HAZY (*)    Hgb urine dipstick LARGE (*)    Protein, ur 100 (*)    RBC / HPF >50 (*)    All other components within normal limits  POC URINE PREG, ED    EKG None  Radiology No results found.  Procedures Procedures    Medications Ordered in ED Medications - No data to display  ED Course/ Medical Decision Making/ A&P                           Medical Decision Making Amount and/or Complexity of Data Reviewed Labs: ordered.   Patient pregnancy test negative.  Urinalysis without acute findings RBCs greater than 50 but no bacteria seen.  White blood cell 11-20.  Does not seem to be consistent with urinary tract infection.  Patient's vital signs here no hypotension.  No active vaginal bleeding abdomen is soft and nontender.  Dr. Elonda Husky is on for [facility] we will try to contact him to see what we need to do if anything at all.  Will order CBC basic metabolic panel.  Contacted Dr. Elonda Husky said this is sloughing of the endometrial lining secondary to the Megace.  Very normal.  Usually has cramping but no significant bleeding fits her scenario perfectly stated.  Patient's vital signs are fine here visit no labs are required and no imaging is required.  Patient stable for discharge home and follow-up with their office.   Final Clinical Impression(s) / ED Diagnoses Final diagnoses:  Vaginal discharge  Abdominal cramping    Rx / DC Orders ED Discharge Orders     None         Fredia Sorrow, MD 10/14/21 1835

## 2021-10-14 NOTE — Discharge Instructions (Signed)
Follow back up with [facility] as planned.  Discussed with Dr. Elonda Husky and this is due to sloughing of the endometrial lining from the Megace.  Would not expect any heavy bleeding.  Return for any new or worse symptoms.

## 2021-10-14 NOTE — ED Triage Notes (Signed)
Pt had a endometrial biopsy on 7/12. Pt c/o severe pelvic cramping and pt passed a "golfball size tissue" today.

## 2021-10-16 ENCOUNTER — Encounter: Payer: Self-pay | Admitting: Obstetrics & Gynecology

## 2021-10-17 ENCOUNTER — Encounter: Payer: Self-pay | Admitting: Obstetrics & Gynecology

## 2021-11-13 ENCOUNTER — Encounter: Payer: Self-pay | Admitting: Family Medicine

## 2021-11-18 ENCOUNTER — Other Ambulatory Visit: Payer: Self-pay | Admitting: Adult Health

## 2021-11-22 NOTE — Patient Instructions (Signed)
Emily Boone  11/22/2021     '@PREFPERIOPPHARMACY'$ @   Your procedure is scheduled on  11/28/2021.   Report to [facility] at  [facility]   Call this number if you have problems the morning of surgery:  [phone]   Remember:  Do not eat or drink after midnight.      Take these medicines the morning of surgery with A SIP OF WATER                                      claritin, prilosec.     Do not wear jewelry, make-up or nail polish.  Do not wear lotions, powders, or perfumes, or deodorant.  Do not shave 48 hours prior to surgery.  Men may shave face and neck.  Do not bring valuables to the hospital.  [facility] is not responsible for any belongings or valuables.  Contacts, dentures or bridgework may not be worn into surgery.  Leave your suitcase in the car.  After surgery it may be brought to your room.  For patients admitted to the hospital, discharge time will be determined by your treatment team.  Patients discharged the day of surgery will not be allowed to drive home and must have someone with them for 24 hours.    Special instructions:   DO NOT smoke tobacco or vape for 24 hours before your procedure.  Please read over the following fact sheets that you were given. Coughing and Deep Breathing, Surgical Site Infection Prevention, Anesthesia Post-op Instructions, and Care and Recovery After Surgery      Dilation and Curettage or Vacuum Curettage, Care After The following information offers guidance on how to care for yourself after your procedure. Your doctor may also give you more specific instructions. If you have problems or questions, contact your doctor. What can I expect after the procedure? After the procedure, it is common to have: Mild pain or cramps. Some bleeding or spotting from the vagina. These may last for up to 2 weeks. Follow these instructions at home: Medicines Take over-the-counter and prescription medicines only as told by  your doctor. If told, take steps to prevent problems with pooping (constipation). You may need to: Drink enough fluid to keep your pee (urine) pale yellow. Take medicines. You will be told what medicines to take. Eat foods that are high in fiber. These include beans, whole grains, and fresh fruits and vegetables. Limit foods that are high in fat and sugar. These include fried or sweet foods. Ask your doctor if you should avoid driving or using machines while you are taking your medicine. Activity  If you were given a medicine to help you relax (sedative) during your procedure, it can affect you for many hours. Do not drive or use machinery until your doctor says that it is safe. Rest as told by your doctor. Get up to take short walks every 1-2 hours. Ask for help if you feel weak or unsteady. Do not lift anything that is heavier than 10 lb (4.5 kg), or the limit that you are told. Return to your normal activities when your doctor says that it is safe. Lifestyle For at least 2 weeks, or as long as told by your doctor: Do not douche. Do not use tampons. Do not have sex. General instructions Do not take baths, swim, or use a hot  tub. Ask your doctor if you may take showers. Do not smoke or use any products that contain nicotine or tobacco. These can delay healing. If you need help quitting, ask your doctor. Wear compression stockings as told by your doctor. It is up to you to get the results of your procedure. Ask how to get your results when they are ready. Keep all follow-up visits. Contact a doctor if: You have very bad cramps that get worse or do not get better with medicine. You have very bad pain in your belly (abdomen). You cannot drink fluids without vomiting. You have pain in the area just above your thighs. You have fluid from your vagina that smells bad. You have a rash. Get help right away if: You are bleeding a lot from your vagina. This means soaking more than one sanitary  pad in 1 hour, and this happens for 2 hours in a row. You have a fever that is above 100.59F (38C). Your belly feels very tender or hard. You have chest pain. You have trouble breathing. You feel dizzy or light-headed. You faint. You have pain in your neck or shoulder area. These symptoms may be an emergency. Get help right away. Call your local emergency services (911 in the [place] Do not wait to see if the symptoms will go away. Do not drive yourself to the hospital. Summary After your procedure, it is common to have pain or cramping. It is also common to have bleeding or spotting from your vagina. Rest as told. Get up to take short walks every 1-2 hours. Do not lift anything that is heavier than 10 lb (4.5 kg), or the limit that you are told. Get help right away if you have problems from the procedure. Ask your doctor what problems to watch for. This information is not intended to replace advice given to you by your health care provider. Make sure you discuss any questions you have with your health care provider. Document Revised: 03/09/2020 Document Reviewed: 03/09/2020 Elsevier Patient Education  [place] Anesthesia, Adult, Care After The following information offers guidance on how to care for yourself after your procedure. Your health care provider may also give you more specific instructions. If you have problems or questions, contact your health care provider. What can I expect after the procedure? After the procedure, it is common for people to: Have pain or discomfort at the IV site. Have nausea or vomiting. Have a sore throat or hoarseness. Have trouble concentrating. Feel cold or chills. Feel weak, sleepy, or tired (fatigue). Have soreness and body aches. These can affect parts of the body that were not involved in surgery. Follow these instructions at home: For the time period you were told by your health care provider:  Rest. Do not participate in  activities where you could fall or become injured. Do not drive or use machinery. Do not drink alcohol. Do not take sleeping pills or medicines that cause drowsiness. Do not make important decisions or sign legal documents. Do not take care of children on your own. General instructions Drink enough fluid to keep your urine pale yellow. If you have sleep apnea, surgery and certain medicines can increase your risk for breathing problems. Follow instructions from your health care provider about wearing your sleep device: Anytime you are sleeping, including during daytime naps. While taking prescription pain medicines, sleeping medicines, or medicines that make you drowsy. Return to your normal activities as told by your health care provider. Ask  your health care provider what activities are safe for you. Take over-the-counter and prescription medicines only as told by your health care provider. Do not use any products that contain nicotine or tobacco. These products include cigarettes, chewing tobacco, and vaping devices, such as e-cigarettes. These can delay incision healing after surgery. If you need help quitting, ask your health care provider. Contact a health care provider if: You have nausea or vomiting that does not get better with medicine. You vomit every time you eat or drink. You have pain that does not get better with medicine. You cannot urinate or have bloody urine. You develop a skin rash. You have a fever. Get help right away if: You have trouble breathing. You have chest pain. You vomit blood. These symptoms may be an emergency. Get help right away. Call 911. Do not wait to see if the symptoms will go away. Do not drive yourself to the hospital. Summary After the procedure, it is common to have a sore throat, hoarseness, nausea, vomiting, or to feel weak, sleepy, or fatigue. For the time period you were told by your health care provider, do not drive or use machinery. Get  help right away if you have difficulty breathing, have chest pain, or vomit blood. These symptoms may be an emergency. This information is not intended to replace advice given to you by your health care provider. Make sure you discuss any questions you have with your health care provider. Document Revised: 06/16/2021 Document Reviewed: 06/16/2021 Elsevier Patient Education  [place].

## 2021-11-24 ENCOUNTER — Encounter (HOSPITAL_COMMUNITY)
Admission: RE | Admit: 2021-11-24 | Discharge: 2021-11-24 | Disposition: A | Discharge status: Home / Self Care | Payer: BC Managed Care – PPO | Source: Ambulatory Visit | Attending: Obstetrics & Gynecology | Admitting: Obstetrics & Gynecology

## 2021-11-24 ENCOUNTER — Encounter (HOSPITAL_COMMUNITY): Payer: Self-pay

## 2021-11-24 DIAGNOSIS — Z01818 Encounter for other preprocedural examination: Secondary | ICD-10-CM | POA: Insufficient documentation

## 2021-11-24 DIAGNOSIS — N939 Abnormal uterine and vaginal bleeding, unspecified: Secondary | ICD-10-CM | POA: Diagnosis not present

## 2021-11-24 HISTORY — DX: Unspecified osteoarthritis, unspecified site: M19.90

## 2021-11-24 LAB — CBC
HCT: 33.9 % — ABNORMAL LOW (ref 36.0–46.0)
Hemoglobin: 10.4 g/dL — ABNORMAL LOW (ref 12.0–15.0)
MCH: 23.9 pg — ABNORMAL LOW (ref 26.0–34.0)
MCHC: 30.7 g/dL (ref 30.0–36.0)
MCV: 77.8 fL — ABNORMAL LOW (ref 80.0–100.0)
Platelets: 332 10*3/uL (ref 150–400)
RBC: 4.36 MIL/uL (ref 3.87–5.11)
RDW: 16.4 % — ABNORMAL HIGH (ref 11.5–15.5)
WBC: 7.3 10*3/uL (ref 4.0–10.5)
nRBC: 0 % (ref 0.0–0.2)

## 2021-11-24 LAB — POCT PREGNANCY, URINE: Preg Test, Ur: NEGATIVE

## 2021-11-27 NOTE — H&P (Signed)
[facility] Attending History and Physical  Emily Boone is a 46 y.o. [id number] who presents for scheduled hysteroscopy, D&C, Hydrothermal ablation  In review, she notes continued ongoing irregular bleeding.  She continues to note prolonged bleeding with large clots.  Reports episodes of "explosions" where short episodes of extreme bleeding.  She has tried both Megace and then daily Norethindrone.  Still notes sporadic bleeding with increased activity.  Currently having daily spotting while on the pill.  She also reports dysmenorrhea.  No acute complaints or changes since her prior visit.  Denies any abnormal vaginal discharge, fevers, chills, sweats, dysuria, nausea, vomiting, other GI or GU symptoms or other general symptoms.  Past Medical History:  Diagnosis Date   Anxiety    Arthritis    Depression    GERD (gastroesophageal reflux disease)    History of kidney stones    PTSD (post-traumatic stress disorder)    Past Surgical History:  Procedure Laterality Date   CHOLECYSTECTOMY     COLONOSCOPY WITH PROPOFOL N/A 05/12/2021   Procedure: COLONOSCOPY WITH PROPOFOL;  Surgeon: Eloise Harman, DO;  Location: [facility];  Service: Endoscopy;  Laterality: N/A;  10:00 / ASA 2   EPIGASTRIC HERNIA REPAIR N/A 12/14/2016   Procedure: OPEN REPAIR OF EPIGASTRIC HERNIA ;  Surgeon: Clovis Riley, MD;  Location: [facility];  Service: General;  Laterality: N/A;   HERNIA REPAIR     INSERTION OF MESH N/A 12/14/2016   Procedure: INSERTION OF MESH;  Surgeon: Clovis Riley, MD;  Location: [facility];  Service: General;  Laterality: N/A;   KNEE ARTHROSCOPY Right    POLYPECTOMY  05/12/2021   Procedure: POLYPECTOMY INTESTINAL;  Surgeon: Eloise Harman, DO;  Location: [facility];  Service: Endoscopy;;   TUBAL LIGATION     UMBILICAL HERNIA REPAIR N/A 12/14/2016   Procedure: OPEN REPAIR OF UMBILICAL HERNIA;  Surgeon: Clovis Riley, MD;  Location: [facility];  Service:  General;  Laterality: N/A;   OB History  Gravida Para Term Preterm AB Living  '5       1 4  '$ SAB IAB Ectopic Multiple Live Births          4    # Outcome Date GA Lbr Len/2nd Weight Sex Delivery Anes PTL Lv  5 Gravida           4 Gravida           3 Gravida           2 Gravida           1 AB           Patient denies any other pertinent gynecologic issues.  No current facility-administered medications on file prior to encounter.   Current Outpatient Medications on File Prior to Encounter  Medication Sig Dispense Refill   atorvastatin (LIPITOR) 40 MG tablet Take 1 tablet (40 mg total) by mouth daily. (Patient taking differently: Take 40 mg by mouth at bedtime.) 90 tablet 3   fluticasone (FLONASE) 50 MCG/ACT nasal spray Place 2 sprays into both nostrils daily. (Patient taking differently: Place 2 sprays into both nostrils daily as needed for allergies.) 16 g 6   ibuprofen (ADVIL) 200 MG tablet Take 400-800 mg by mouth every 8 (eight) hours as needed (for fibromyalgia pain).     loratadine (CLARITIN) 10 MG tablet Take 10 mg by mouth daily.     omeprazole (PRILOSEC) 20 MG capsule Take 20 mg by mouth daily.  Phenylephrine-Acetaminophen 5-325 MG TABS Take 2 tablets by mouth every 6 (six) hours as needed (cold symptoms).     Allergies  Allergen Reactions   Clindamycin/Lincomycin Other (See Comments)    "stuck in throat" cannot take pill form; can take as liquid   Morphine And Related Other (See Comments)    Hypotensive (lower blood pressure)   Penicillins Swelling and Rash    Childhood reaction and family history  Has patient had a PCN reaction causing immediate rash, facial/tongue/throat swelling, SOB or lightheadedness with hypotension: Unknown Has patient had a PCN reaction causing severe rash involving mucus membranes or skin necrosis: Unknown Has patient had a PCN reaction that required hospitalization: Unknown Has patient had a PCN reaction occurring within the last 10 years:  No If all of the above answers are "NO", then may proceed with Cephalosporin use.     Social History:   reports that she quit smoking about 6 years ago. Her smoking use included cigarettes. She has a 1.70 pack-year smoking history. She has never used smokeless tobacco. She reports that she does not drink alcohol and does not use drugs. Family History  Problem Relation Age of Onset   Diverticulitis Paternal Grandmother    Hypertension Maternal Grandmother    Depression Maternal Grandmother    Cancer Maternal Grandfather    Cancer Paternal Aunt        ovarian   Breast cancer Neg Hx     Review of Systems: Pertinent items noted in HPI and remainder of comprehensive ROS otherwise negative.  PHYSICAL EXAM: BP 137/82 (BP Location: Right Arm)   Temp 98.5 F (36.9 C)   Resp 19   Ht '5\' 10"'$  (1.778 m)   Wt 105 kg   LMP  (LMP Unknown) Comment: constant spotting  SpO2 97%   BMI 33.21 kg/m   CONSTITUTIONAL: Well-developed, well-nourished female in no acute distress.  SKIN: Skin is warm and dry. No rash noted. Not diaphoretic. No erythema. No pallor. NEUROLOGIC: Alert and oriented to person, place, and time. Normal reflexes, muscle tone coordination. No cranial nerve deficit noted. PSYCHIATRIC: Normal mood and affect. Normal behavior. Normal judgment and thought content. CARDIOVASCULAR: Normal heart rate noted, regular rhythm RESPIRATORY: Effort and breath sounds normal, no problems with respiration noted ABDOMEN: Soft, nontender, nondistended. PELVIC: deferred MUSCULOSKELETAL: no calf tenderness bilaterally EXT: no edema bilaterally, normal pulses  Labs: Results for orders placed or performed during the hospital encounter of 11/24/21 (from the past 336 hour(s))  CBC   Collection Time: 11/24/21  9:16 AM  Result Value Ref Range   WBC 7.3 4.0 - 10.5 K/uL   RBC 4.36 3.87 - 5.11 MIL/uL   Hemoglobin 10.4 (L) 12.0 - 15.0 g/dL   HCT 33.9 (L) 36.0 - 46.0 %   MCV 77.8 (L) 80.0 - 100.0 fL    MCH 23.9 (L) 26.0 - 34.0 pg   MCHC 30.7 30.0 - 36.0 g/dL   RDW 16.4 (H) 11.5 - 15.5 %   Platelets 332 150 - 400 K/uL   nRBC 0.0 0.0 - 0.2 %  Pregnancy, urine POC   Collection Time: 11/24/21  9:32 AM  Result Value Ref Range   Preg Test, Ur NEGATIVE NEGATIVE    Imaging Studies:  Pelvic US completed 07/2021: 8.7cm uterus with no abnormalities.  Normal ovaries bilaterally.  Endometrium 65m  Assessment: AUB   Plan: Hysteroscopy, D&C, Hydrothermal ablation -NPO -LR @ 125cc/hr -SCDs to OR -Risk/benefits and alternatives reviewed with the patient including but not limited to  risk of bleeding, infection and injury to surrounding organs.  Questions and concerns were addressed and pt desires to proceed  Janyth Pupa, DO Attending [place], [facility] for [facility], [place]

## 2021-11-28 ENCOUNTER — Encounter (HOSPITAL_COMMUNITY)
Admission: RE | Disposition: A | Discharge status: Home / Self Care | Payer: Self-pay | Source: Home / Self Care | Attending: Obstetrics & Gynecology

## 2021-11-28 ENCOUNTER — Encounter (HOSPITAL_COMMUNITY): Payer: Self-pay | Admitting: Obstetrics & Gynecology

## 2021-11-28 ENCOUNTER — Ambulatory Visit (HOSPITAL_COMMUNITY): Payer: BC Managed Care – PPO | Admitting: Certified Registered"

## 2021-11-28 ENCOUNTER — Other Ambulatory Visit: Payer: Self-pay

## 2021-11-28 ENCOUNTER — Ambulatory Visit (HOSPITAL_COMMUNITY)
Admission: RE | Admit: 2021-11-28 | Discharge: 2021-11-28 | Disposition: A | Discharge status: Home / Self Care | Payer: BC Managed Care – PPO | Attending: Obstetrics & Gynecology | Admitting: Obstetrics & Gynecology

## 2021-11-28 DIAGNOSIS — N8000 Endometriosis of the uterus, unspecified: Secondary | ICD-10-CM | POA: Diagnosis not present

## 2021-11-28 DIAGNOSIS — Z87891 Personal history of nicotine dependence: Secondary | ICD-10-CM | POA: Insufficient documentation

## 2021-11-28 DIAGNOSIS — N921 Excessive and frequent menstruation with irregular cycle: Secondary | ICD-10-CM

## 2021-11-28 DIAGNOSIS — N939 Abnormal uterine and vaginal bleeding, unspecified: Secondary | ICD-10-CM | POA: Diagnosis not present

## 2021-11-28 DIAGNOSIS — K219 Gastro-esophageal reflux disease without esophagitis: Secondary | ICD-10-CM | POA: Insufficient documentation

## 2021-11-28 DIAGNOSIS — N858 Other specified noninflammatory disorders of uterus: Secondary | ICD-10-CM | POA: Diagnosis not present

## 2021-11-28 DIAGNOSIS — Z01818 Encounter for other preprocedural examination: Secondary | ICD-10-CM

## 2021-11-28 HISTORY — PX: DILITATION & CURRETTAGE/HYSTROSCOPY WITH HYDROTHERMAL ABLATION: SHX5570

## 2021-11-28 SURGERY — DILATATION & CURETTAGE/HYSTEROSCOPY WITH HYDROTHERMAL ABLATION
Anesthesia: General | Site: Vagina

## 2021-11-28 MED ORDER — LIDOCAINE 2% (20 MG/ML) 5 ML SYRINGE
INTRAMUSCULAR | Status: DC | PRN
  Administered 2021-11-28: 80 mg via INTRAVENOUS

## 2021-11-28 MED ORDER — KETOROLAC TROMETHAMINE 30 MG/ML IJ SOLN
INTRAMUSCULAR | Status: DC | PRN
  Administered 2021-11-28: 30 mg via INTRAVENOUS

## 2021-11-28 MED ORDER — FENTANYL CITRATE PF 50 MCG/ML IJ SOSY
PREFILLED_SYRINGE | INTRAMUSCULAR | Status: AC
  Filled 2021-11-28: qty 1

## 2021-11-28 MED ORDER — ORAL CARE MOUTH RINSE: 15.0000 mL | Freq: Once | OROMUCOSAL | Status: DC

## 2021-11-28 MED ORDER — SODIUM CHLORIDE 0.9 % IR SOLN
Status: DC | PRN
  Administered 2021-11-28: 1000 mL

## 2021-11-28 MED ORDER — LIDOCAINE-EPINEPHRINE 0.5 %-1:200000 IJ SOLN
INTRAMUSCULAR | Status: AC
  Filled 2021-11-28: qty 1

## 2021-11-28 MED ORDER — LACTATED RINGERS IV SOLN: INTRAVENOUS | Status: DC

## 2021-11-28 MED ORDER — MIDAZOLAM HCL 2 MG/2ML IJ SOLN
INTRAMUSCULAR | Status: AC
  Filled 2021-11-28: qty 2

## 2021-11-28 MED ORDER — CHLORHEXIDINE GLUCONATE 0.12 % MT SOLN: 15.0000 mL | Freq: Once | OROMUCOSAL | Status: DC

## 2021-11-28 MED ORDER — MIDAZOLAM HCL 5 MG/5ML IJ SOLN
INTRAMUSCULAR | Status: DC | PRN
  Administered 2021-11-28: 2 mg via INTRAVENOUS

## 2021-11-28 MED ORDER — FENTANYL CITRATE PF 50 MCG/ML IJ SOSY
25.0000 ug | PREFILLED_SYRINGE | INTRAMUSCULAR | Status: DC | PRN
  Administered 2021-11-28 (×2): 50 ug via INTRAVENOUS
  Filled 2021-11-28: qty 1

## 2021-11-28 MED ORDER — PROPOFOL 10 MG/ML IV BOLUS
INTRAVENOUS | Status: AC
  Filled 2021-11-28: qty 20

## 2021-11-28 MED ORDER — LIDOCAINE-EPINEPHRINE 0.5 %-1:200000 IJ SOLN
INTRAMUSCULAR | Status: DC | PRN
  Administered 2021-11-28: 10 mL

## 2021-11-28 MED ORDER — OXYCODONE HCL 5 MG PO TABS
ORAL_TABLET | ORAL | Status: AC
  Filled 2021-11-28: qty 1

## 2021-11-28 MED ORDER — DEXAMETHASONE SODIUM PHOSPHATE 10 MG/ML IJ SOLN
INTRAMUSCULAR | Status: AC
  Filled 2021-11-28: qty 1

## 2021-11-28 MED ORDER — PROPOFOL 10 MG/ML IV BOLUS
INTRAVENOUS | Status: DC | PRN
  Administered 2021-11-28: 200 mg via INTRAVENOUS

## 2021-11-28 MED ORDER — LIDOCAINE HCL (PF) 2 % IJ SOLN
INTRAMUSCULAR | Status: AC
  Filled 2021-11-28: qty 5

## 2021-11-28 MED ORDER — ONDANSETRON HCL 4 MG/2ML IJ SOLN
INTRAMUSCULAR | Status: DC | PRN
  Administered 2021-11-28: 4 mg via INTRAVENOUS

## 2021-11-28 MED ORDER — DEXAMETHASONE SODIUM PHOSPHATE 10 MG/ML IJ SOLN
INTRAMUSCULAR | Status: DC | PRN
  Administered 2021-11-28: 10 mg via INTRAVENOUS

## 2021-11-28 MED ORDER — OXYCODONE HCL 5 MG PO TABS
5.0000 mg | ORAL_TABLET | Freq: Once | ORAL | Status: AC
  Administered 2021-11-28: 5 mg via ORAL

## 2021-11-28 MED ORDER — FENTANYL CITRATE (PF) 100 MCG/2ML IJ SOLN
INTRAMUSCULAR | Status: AC
  Filled 2021-11-28: qty 2

## 2021-11-28 MED ORDER — FENTANYL CITRATE (PF) 100 MCG/2ML IJ SOLN
INTRAMUSCULAR | Status: DC | PRN
  Administered 2021-11-28 (×2): 50 ug via INTRAVENOUS

## 2021-11-28 SURGICAL SUPPLY — 22 items
CATH ROBINSON RED A/P 16FR (CATHETERS) ×1 IMPLANT
CLOTH BEACON ORANGE TIMEOUT ST (SAFETY) ×1 IMPLANT
COVER SURGICAL LIGHT HANDLE (MISCELLANEOUS) ×2 IMPLANT
DILATOR CANAL MILEX (MISCELLANEOUS) IMPLANT
GAUZE 4X4 16PLY ~~LOC~~+RFID DBL (SPONGE) ×1 IMPLANT
GLOVE BIO SURGEON STRL SZ 6.5 (GLOVE) ×1 IMPLANT
GLOVE BIOGEL PI IND STRL 7.0 (GLOVE) ×3 IMPLANT
GLOVE BIOGEL PI INDICATOR 7.0 (GLOVE) ×4
GLOVE SURG LTX SZ6.5 (GLOVE) ×1 IMPLANT
GLOVE SURG SS PI 7.5 STRL IVOR (GLOVE) IMPLANT
GOWN STRL REUS W/ TWL LRG LVL3 (GOWN DISPOSABLE) ×1 IMPLANT
GOWN STRL REUS W/TWL LRG LVL3 (GOWN DISPOSABLE) ×2 IMPLANT
IV NS IRRIG 3000ML ARTHROMATIC (IV SOLUTION) ×1 IMPLANT
KIT TURNOVER CYSTO (KITS) ×1 IMPLANT
NS IRRIG 1000ML POUR BTL (IV SOLUTION) ×1 IMPLANT
PACK PERI GYN (CUSTOM PROCEDURE TRAY) ×1 IMPLANT
PAD ARMBOARD 7.5X6 YLW CONV (MISCELLANEOUS) ×2 IMPLANT
PAD TELFA 3X4 1S STER (GAUZE/BANDAGES/DRESSINGS) ×1 IMPLANT
SET BASIN LINEN APH (SET/KITS/TRAYS/PACK) ×1 IMPLANT
SET GENESYS HTA PROCERVA (MISCELLANEOUS) ×1 IMPLANT
SYR CONTROL 10ML LL (SYRINGE) ×1 IMPLANT
UNDERPAD 30X36 HEAVY ABSORB (UNDERPADS AND DIAPERS) ×1 IMPLANT

## 2021-11-28 NOTE — Discharge Instructions (Signed)
HOME INSTRUCTIONS  Please note any unusual or excessive bleeding, pain, swelling. Mild dizziness or drowsiness are normal for about 24 hours after surgery.   Shower when comfortable  Restrictions: No driving for 24 hours or while taking pain medications.  Activity:  No heavy lifting (> 10 lbs), nothing in vagina (no tampons, douching, or intercourse) x 4 weeks; no tub baths for 4 weeks Vaginal spotting is expected but if your bleeding is heavy, period like,  please call the office   Diet:  You may return to your regular diet.  Do not eat large meals.  Eat small frequent meals throughout the day.  Continue to drink a good amount of water at least 6-8 glasses of water per day, hydration is very important for the healing process.  Pain Management: Take over the counter tylenol or ibuprofen as needed for pain.  You can either take one or alternate between the two medications for pain management.  You may also use a heating pack as needed.    Alcohol -- Avoid for 24 hours and while taking pain medications.  Nausea: Take sips of ginger ale or soda  Fever -- Call physician if temperature over 101 degrees  Follow up:  If you do not already have a follow up appointment scheduled, please call the office at [phone].  If you experience fever (a temperature greater than 100.4), pain unrelieved by pain medication, shortness of breath, swelling of a single leg, or any other symptoms which are concerning to you please the office immediately.  

## 2021-11-28 NOTE — Transfer of Care (Signed)
Immediate Anesthesia Transfer of Care Note  Patient: Emily Boone  Procedure(s) Performed: DILATATION & CURETTAGE/HYSTEROSCOPY WITH HYDROTHERMAL ABLATION (Vagina )  Patient Location: PACU  Anesthesia Type:General  Level of Consciousness: awake  Airway & Oxygen Therapy: Patient Spontanous Breathing  Post-op Assessment: Report given to RN and Post -op Vital signs reviewed and stable  Post vital signs: Reviewed and stable  Last Vitals:  Vitals Value Taken Time  BP 135/85 11/28/21 1205  Temp 36.7 C 11/28/21 1205  Pulse 70 11/28/21 1209  Resp 19 11/28/21 1209  SpO2 96 % 11/28/21 1209  Vitals shown include unvalidated device data.  Last Pain:  Vitals:   11/28/21 1205  PainSc: Asleep         Complications: No notable events documented.

## 2021-11-28 NOTE — Op Note (Signed)
Operative Report  PreOp: Abnormal uterine bleeding PostOp: same Procedure:  Hysteroscopy, Dilation and Curettage, Endometrial ablation Surgeon: Dr. Janyth Pupa Anesthesia: General Complications:none EBL: Minimal IVF:600cc  Findings: 9cm uterus with proliferative endometrium.  Both ostia visualized.  Specimens: endometrial curettings  Procedure: The patient was taken to the operating room where she underwent general anesthesia without difficulty. The patient was placed in a low lithotomy position using Allen stirrups. She was then prepped and draped in the normal sterile fashion. Sterile speculum was placed.  A single tooth tenaculum was placed on the anterior lip of the cervix. Cervical block was completed using 0.5% lidocaine with epinephrine.  The uterus was then sounded to 9. Multiparous os already appeared dilated.  The hysteroscope was inserted without difficulty and the findings as noted above.  The hysteroscope was removed and sharp curettage was performed. The tissue was sent to pathology.   The hydrothermal ablation hysteroscopic apparatus was then reinserted.  Cavity assessment was performed and passed with minimal leakage of fluid.  The hydrothermal ablation was then carried out as per protocol.   Due to cavity assessment with loss of fluid the procedure was completed for only 9 out of the 10 minutes.  Complete ablation of the endometrium was observed and the hysteroscope was removed under direct visualization.  All instrument were then removed. Hemostasis was observed at the cervical site. The patient was repositioned to the supine position. The patient tolerated the procedure without any complications and taken to recovery in stable condition.   Janyth Pupa, DO Attending [place], [facility] for [employer], [place]

## 2021-11-28 NOTE — Anesthesia Preprocedure Evaluation (Signed)
Anesthesia Evaluation  Patient identified by MRN, date of birth, ID band Patient awake    Reviewed: Allergy & Precautions, H&P , NPO status , Patient's Chart, lab work & pertinent test results, reviewed documented beta blocker date and time   Airway Mallampati: II  TM Distance: >3 FB Neck ROM: full    Dental no notable dental hx.    Pulmonary neg pulmonary ROS, former smoker,    Pulmonary exam normal breath sounds clear to auscultation       Cardiovascular Exercise Tolerance: Good negative cardio ROS   Rhythm:regular Rate:Normal     Neuro/Psych PSYCHIATRIC DISORDERS Anxiety Depression  Neuromuscular disease    GI/Hepatic Neg liver ROS, GERD  Medicated,  Endo/Other  negative endocrine ROS  Renal/GU negative Renal ROS  negative genitourinary   Musculoskeletal   Abdominal   Peds  Hematology negative hematology ROS (+)   Anesthesia Other Findings   Reproductive/Obstetrics negative OB ROS                             Anesthesia Physical Anesthesia Plan  ASA: 2  Anesthesia Plan: General and General LMA   Post-op Pain Management:    Induction:   PONV Risk Score and Plan: Ondansetron  Airway Management Planned:   Additional Equipment:   Intra-op Plan:   Post-operative Plan:   Informed Consent: I have reviewed the patients History and Physical, chart, labs and discussed the procedure including the risks, benefits and alternatives for the proposed anesthesia with the patient or authorized representative who has indicated his/her understanding and acceptance.     Dental Advisory Given  Plan Discussed with: CRNA  Anesthesia Plan Comments:         Anesthesia Quick Evaluation

## 2021-11-28 NOTE — Anesthesia Procedure Notes (Signed)
Procedure Name: LMA Insertion Date/Time: 11/28/2021 11:15 AM  Performed by: Myna Bright, CRNAPre-anesthesia Checklist: Patient identified, Emergency Drugs available, Suction available and Patient being monitored Patient Re-evaluated:Patient Re-evaluated prior to induction Oxygen Delivery Method: Circle system utilized Preoxygenation: Pre-oxygenation with 100% oxygen Induction Type: IV induction Ventilation: Mask ventilation without difficulty LMA: LMA inserted LMA Size: 4.0 Number of attempts: 1 Placement Confirmation: positive ETCO2 and breath sounds checked- equal and bilateral Tube secured with: Tape Dental Injury: Teeth and Oropharynx as per pre-operative assessment

## 2021-11-30 LAB — SURGICAL PATHOLOGY

## 2021-11-30 NOTE — Anesthesia Postprocedure Evaluation (Signed)
Anesthesia Post Note  Patient: Musician  Procedure(s) Performed: DILATATION & CURETTAGE/HYSTEROSCOPY WITH HYDROTHERMAL ABLATION (Vagina )  Patient location during evaluation: Phase II Anesthesia Type: General Level of consciousness: awake Pain management: pain level controlled Vital Signs Assessment: post-procedure vital signs reviewed and stable Respiratory status: spontaneous breathing and respiratory function stable Cardiovascular status: blood pressure returned to baseline and stable Postop Assessment: no headache and no apparent nausea or vomiting Anesthetic complications: no Comments: Late entry   No notable events documented.   Last Vitals:  Vitals:   11/28/21 1325 11/28/21 1326  BP: (!) 162/96 (!) 158/95  Pulse: (!) 57   Resp: 16   Temp: 37 C   SpO2: 99%     Last Pain:  Vitals:   11/29/21 1512  TempSrc:   PainSc: [place]

## 2021-12-01 ENCOUNTER — Encounter (HOSPITAL_COMMUNITY): Payer: Self-pay | Admitting: Obstetrics & Gynecology

## 2021-12-12 ENCOUNTER — Ambulatory Visit (INDEPENDENT_AMBULATORY_CARE_PROVIDER_SITE_OTHER): Payer: BC Managed Care – PPO | Admitting: Obstetrics & Gynecology

## 2021-12-12 ENCOUNTER — Encounter: Payer: Self-pay | Admitting: Obstetrics & Gynecology

## 2021-12-12 VITALS — BP 128/82 | HR 79 | Ht 70.0 in | Wt 239.0 lb

## 2021-12-12 DIAGNOSIS — Z9889 Other specified postprocedural states: Secondary | ICD-10-CM

## 2021-12-12 DIAGNOSIS — N951 Menopausal and female climacteric states: Secondary | ICD-10-CM

## 2021-12-12 NOTE — Progress Notes (Signed)
   GYN VISIT Patient name: Emily Boone MRN [id number]  Date of birth: [birth year] Chief Complaint:   Follow-up  History of Present Illness:   Emily Boone is a 46 y.o. [id number] female being seen today for s/p ablation completed on 8/29.  So noted a few days of cramping and discomfort, but that has since resolved.  Reports no vaginal bleeding.  Notes occasional clear discharge with a streak a red every now and then.  Denies itching or odor.     Of note, she reports vulvar skin irritation that she thinks is due to a change in detergent.  Since stopping POPs she notes worsening hot flashes.  No LMP recorded (lmp unknown). (Menstrual status: Other).     07/24/2021    1:42 PM 05/31/2021    9:08 AM 05/18/2021    8:29 AM 03/09/2021    8:17 AM 11/18/2020    8:10 AM  Depression screen PHQ 2/9  Decreased Interest 0 0 0 0 0  Down, Depressed, Hopeless 0 0 0 0 0  PHQ - 2 Score 0 0 0 0 0  Altered sleeping 0 0  0 1  Tired, decreased energy 0 '1  1 1  '$ Change in appetite 0 0  1 0  Feeling bad or failure about yourself  0 0  0 0  Trouble concentrating 0 0  0 0  Moving slowly or fidgety/restless 0 0  0 0  Suicidal thoughts 0 0  0 0  PHQ-9 Score 0 '1  2 2  '$ Difficult doing work/chores Not difficult at all   Not difficult at all Not difficult at all     Review of Systems:   Pertinent items are noted in HPI Denies fever/chills, dizziness, headaches, visual disturbances, fatigue, shortness of breath, chest pain, abdominal pain, vomiting, no problems with bowel movements, urination, or intercourse unless otherwise stated above.  Pertinent History Reviewed:  Reviewed past medical,surgical, social, obstetrical and family history.  Reviewed problem list, medications and allergies. Physical Assessment:   Vitals:   12/12/21 0945  BP: 128/82  Pulse: 79  Weight: 239 lb (108.4 kg)  Height: '5\' 10"'$  (1.778 m)  Body mass index is 34.29 kg/m.       Physical Examination:   General appearance: alert, well  appearing, and in no distress  Psych: mood appropriate, normal affect  Skin: warm & dry   Cardiovascular: normal heart rate noted  Respiratory: normal respiratory effort, no distress  Abdomen: soft, non-tender, no rebound, no guarding  Pelvic: examination declined  Extremities: no edema, no calf tenderness bilaterally  Chaperone: N/A    Assessment & Plan:  1) AUB- s/p ablation -doing great and notes significant improvement -no acute complaints or concerns  2) Vasomotor symptoms -reviewed conservative management including exercise and herbal supplmements  3) vulvar itching -reviewed conservative treatment and avoiding irritation -if no improvement RTC   Return for March 2024.   Janyth Pupa, DO Attending [place], [employer] for [employer], [place]

## 2021-12-19 ENCOUNTER — Encounter: Payer: Self-pay | Admitting: Obstetrics & Gynecology

## 2022-01-16 ENCOUNTER — Other Ambulatory Visit: Payer: Self-pay | Admitting: Adult Health

## 2022-01-16 ENCOUNTER — Encounter: Payer: Self-pay | Admitting: Obstetrics & Gynecology

## 2022-01-16 MED ORDER — FLUCONAZOLE 150 MG PO TABS: ORAL_TABLET | ORAL | 1 refills | Status: AC

## 2022-01-16 NOTE — Progress Notes (Signed)
Rx sent for diflucan

## 2022-02-07 ENCOUNTER — Ambulatory Visit: Payer: BC Managed Care – PPO | Admitting: Family Medicine
